# Patient Record
Sex: Female | Born: 1935
Health system: Southern US, Community
[De-identification: ages and names within clinical notes are randomized; demographics above are authoritative.]

## PROBLEM LIST (undated history)

## (undated) DIAGNOSIS — I10 Essential (primary) hypertension: Secondary | ICD-10-CM

## (undated) DIAGNOSIS — A46 Erysipelas: Secondary | ICD-10-CM

## (undated) DIAGNOSIS — J309 Allergic rhinitis, unspecified: Secondary | ICD-10-CM

## (undated) DIAGNOSIS — E039 Hypothyroidism, unspecified: Secondary | ICD-10-CM

## (undated) DIAGNOSIS — J302 Other seasonal allergic rhinitis: Secondary | ICD-10-CM

## (undated) DIAGNOSIS — C50919 Malignant neoplasm of unspecified site of unspecified female breast: Secondary | ICD-10-CM

## (undated) HISTORY — DX: Hypothyroidism, unspecified: E03.9

## (undated) HISTORY — PX: TONSILLECTOMY: SUR1361

## (undated) HISTORY — DX: Malignant neoplasm of unspecified site of unspecified female breast: C50.919

## (undated) HISTORY — DX: Allergic rhinitis, unspecified: J30.9

## (undated) HISTORY — DX: Other seasonal allergic rhinitis: J30.2

## (undated) HISTORY — DX: Essential (primary) hypertension: I10

## (undated) HISTORY — DX: Erysipelas: A46

---

## 1984-04-18 HISTORY — PX: BREAST LUMPECTOMY: SHX2

## 1985-04-18 DIAGNOSIS — C50919 Malignant neoplasm of unspecified site of unspecified female breast: Secondary | ICD-10-CM

## 1985-04-18 HISTORY — DX: Malignant neoplasm of unspecified site of unspecified female breast: C50.919

## 1998-10-29 ENCOUNTER — Other Ambulatory Visit: Admission: RE | Admit: 1998-10-29 | Discharge: 1998-10-29 | Payer: Self-pay | Admitting: Internal Medicine

## 1999-10-28 ENCOUNTER — Encounter: Admission: RE | Admit: 1999-10-28 | Discharge: 1999-10-28 | Payer: Self-pay | Admitting: Surgery

## 1999-10-28 ENCOUNTER — Encounter: Payer: Self-pay | Admitting: Surgery

## 1999-11-09 ENCOUNTER — Other Ambulatory Visit: Admission: RE | Admit: 1999-11-09 | Discharge: 1999-11-09 | Payer: Self-pay | Admitting: Internal Medicine

## 1999-12-29 ENCOUNTER — Ambulatory Visit (HOSPITAL_COMMUNITY): Admission: RE | Admit: 1999-12-29 | Discharge: 1999-12-29 | Payer: Self-pay | Admitting: *Deleted

## 2001-03-21 ENCOUNTER — Encounter: Payer: Self-pay | Admitting: Surgery

## 2001-03-21 ENCOUNTER — Encounter: Admission: RE | Admit: 2001-03-21 | Discharge: 2001-03-21 | Payer: Self-pay | Admitting: Surgery

## 2002-02-20 ENCOUNTER — Encounter: Admission: RE | Admit: 2002-02-20 | Discharge: 2002-02-20 | Payer: Self-pay | Admitting: Internal Medicine

## 2002-02-20 ENCOUNTER — Encounter: Payer: Self-pay | Admitting: Internal Medicine

## 2002-03-12 ENCOUNTER — Other Ambulatory Visit: Admission: RE | Admit: 2002-03-12 | Discharge: 2002-03-12 | Payer: Self-pay | Admitting: Internal Medicine

## 2003-02-24 ENCOUNTER — Encounter: Admission: RE | Admit: 2003-02-24 | Discharge: 2003-02-24 | Payer: Self-pay | Admitting: Internal Medicine

## 2003-03-20 ENCOUNTER — Other Ambulatory Visit: Admission: RE | Admit: 2003-03-20 | Discharge: 2003-03-20 | Payer: Self-pay | Admitting: Internal Medicine

## 2004-02-26 ENCOUNTER — Encounter: Admission: RE | Admit: 2004-02-26 | Discharge: 2004-02-26 | Payer: Self-pay | Admitting: Surgery

## 2004-03-02 ENCOUNTER — Encounter: Admission: RE | Admit: 2004-03-02 | Discharge: 2004-03-02 | Payer: Self-pay | Admitting: Surgery

## 2004-03-25 ENCOUNTER — Other Ambulatory Visit: Admission: RE | Admit: 2004-03-25 | Discharge: 2004-03-25 | Payer: Self-pay | Admitting: Internal Medicine

## 2005-01-26 ENCOUNTER — Encounter: Admission: RE | Admit: 2005-01-26 | Discharge: 2005-01-26 | Payer: Self-pay | Admitting: Internal Medicine

## 2005-04-28 ENCOUNTER — Ambulatory Visit (HOSPITAL_COMMUNITY): Admission: RE | Admit: 2005-04-28 | Discharge: 2005-04-28 | Payer: Self-pay | Admitting: *Deleted

## 2005-05-16 ENCOUNTER — Encounter: Admission: RE | Admit: 2005-05-16 | Discharge: 2005-05-16 | Payer: Self-pay | Admitting: Surgery

## 2005-05-18 ENCOUNTER — Encounter (INDEPENDENT_AMBULATORY_CARE_PROVIDER_SITE_OTHER): Payer: Self-pay | Admitting: Specialist

## 2005-05-18 ENCOUNTER — Ambulatory Visit (HOSPITAL_COMMUNITY): Admission: RE | Admit: 2005-05-18 | Discharge: 2005-05-18 | Payer: Self-pay | Admitting: *Deleted

## 2006-05-31 ENCOUNTER — Encounter: Admission: RE | Admit: 2006-05-31 | Discharge: 2006-05-31 | Payer: Self-pay | Admitting: Surgery

## 2007-06-07 ENCOUNTER — Encounter: Admission: RE | Admit: 2007-06-07 | Discharge: 2007-06-07 | Payer: Self-pay | Admitting: Surgery

## 2008-10-08 ENCOUNTER — Encounter: Admission: RE | Admit: 2008-10-08 | Discharge: 2008-10-08 | Payer: Self-pay | Admitting: Surgery

## 2008-12-03 ENCOUNTER — Ambulatory Visit (HOSPITAL_COMMUNITY): Admission: RE | Admit: 2008-12-03 | Discharge: 2008-12-03 | Payer: Self-pay | Admitting: Cardiology

## 2009-09-02 ENCOUNTER — Other Ambulatory Visit: Admission: RE | Admit: 2009-09-02 | Discharge: 2009-09-02 | Payer: Self-pay | Admitting: Internal Medicine

## 2009-10-09 ENCOUNTER — Encounter: Admission: RE | Admit: 2009-10-09 | Discharge: 2009-10-09 | Payer: Self-pay | Admitting: Surgery

## 2010-06-22 ENCOUNTER — Encounter (INDEPENDENT_AMBULATORY_CARE_PROVIDER_SITE_OTHER): Payer: Self-pay | Admitting: *Deleted

## 2010-06-29 NOTE — Letter (Signed)
Summary: New Patient letter  [facility]  [address] [place], [place] [address]   Phone: [phone]  Fax: [phone]       06/22/2010 MRN: [id number]  Julia Norton [address] [place], [place]  [address]  Dear Ms. Julia Norton,  Welcome to the Gastroenterology Division at [facility].    You are scheduled to see Dr.  Marina Goodell on 08-05-10 at 11:00A.M. on the 3rd floor at [facility], [address].  We ask that you try to arrive at our office 15 minutes prior to your appointment time to allow for check-in.  We would like you to complete the enclosed self-administered evaluation form prior to your visit and bring it with you on the day of your appointment.  We will review it with you.  Also, please bring a complete list of all your medications or, if you prefer, bring the medication bottles and we will list them.  Please bring your insurance card so that we may make a copy of it.  If your insurance requires a referral to see a specialist, please bring your referral form from your primary care physician.  Co-payments are due at the time of your visit and may be paid by cash, check or credit card.     Your office visit will consist of a consult with your physician (includes a physical exam), any laboratory testing he/she may order, scheduling of any necessary diagnostic testing (e.g. x-ray, ultrasound, CT-scan), and scheduling of a procedure (e.g. Endoscopy, Colonoscopy) if required.  Please allow enough time on your schedule to allow for any/all of these possibilities.    If you cannot keep your appointment, please call [phone] to cancel or reschedule prior to your appointment date.  This allows [place] the opportunity to schedule an appointment for another patient in need of care.  If you do not cancel or reschedule by 5 p.m. the business day prior to your appointment date, you will be charged a $50.00 late cancellation/no-show fee.    Thank you for choosing  [facility] for your medical needs.  We appreciate the opportunity to care for you.  Please visit [place] at our website  to learn more about our practice.                     Sincerely,                                                             The Gastroenterology Division

## 2010-08-05 ENCOUNTER — Encounter: Payer: Self-pay | Admitting: Internal Medicine

## 2010-08-05 ENCOUNTER — Ambulatory Visit (INDEPENDENT_AMBULATORY_CARE_PROVIDER_SITE_OTHER): Payer: Medicare Other | Admitting: Internal Medicine

## 2010-08-05 VITALS — BP 130/68 | HR 72 | Ht 68.0 in | Wt 204.0 lb

## 2010-08-05 DIAGNOSIS — R197 Diarrhea, unspecified: Secondary | ICD-10-CM

## 2010-08-05 DIAGNOSIS — Z1211 Encounter for screening for malignant neoplasm of colon: Secondary | ICD-10-CM

## 2010-08-05 NOTE — Progress Notes (Signed)
HISTORY OF PRESENT ILLNESS:  Julia Norton is a 75 y.o. female with the below listed medical history who presents today regarding recent problems with diarrhea and to establish as a new GI patient. The patient's previous GI care was directed by Dr. Sabino Gasser. Review of outside records shows a normal colonoscopy in September of 2001 and again in January 2007. Only internal hemorrhoids. Upper endoscopy in January of 2007 with minimal findings. Patient's current history is that of several months of diarrhea occurring several times daily. She underwent a laboratory workup with her primary care physician Dr. Selena Batten. Review of that workup shows normal CBC, comprehensive metabolic panel, multiple stool studies, stool for white blood cells, celiac sprue testing, fasting gastrin level, and urine for 5-hydroxyindoleacetic acid. Eventually, it was discovered the patient had increased her magnesium supplementation. After holding her magnesium, the diarrhea completely resolved. She is currently asymptomatic. Her GI review of systems is otherwise negative.  REVIEW OF SYSTEMS:  All non-GI ROS negative except for urinary frequency.  Past Medical History  Diagnosis Date  . Hemorrhoids   . Hypothyroidism   . Breast cancer 1987  . Hypertension   . Asthma   . Seasonal allergies     Past Surgical History  Procedure Date  . Tonsillectomy   . Breast lumpectomy     right    Social History Julia Norton  reports that she quit smoking about 49 years ago. She does not have any smokeless tobacco history on file. She reports that she does not drink alcohol or use illicit drugs.  family history includes Celiac disease in her sister and Heart disease in her father and mother.  Allergies  Allergen Reactions  . Penicillins        PHYSICAL EXAMINATION: Vital signs: BP 130/68  Pulse 72  Ht 5\' 8"  (1.727 m)  Wt 204 lb (92.534 kg)  BMI 31.02 kg/m2  Constitutional: generally well-appearing, no acute  distress Psychiatric: alert and oriented x3, cooperative Eyes: extraocular movements intact, anicteric, conjunctiva pink Mouth: oral pharynx moist, no lesions Neck: supple no lymphadenopathy Cardiovascular: heart regular rate and rhythm, no murmur Lungs: clear to auscultation bilaterally Abdomen: soft, nontender, nondistended, no obvious ascites, no peritoneal signs, normal bowel sounds, no organomegaly Extremities: no lower extremity edema bilaterally Skin: no lesions on visible extremities Neuro: No focal deficits. Normal reflexes   ASSESSMENT:  #1. Diarrhea. Secondary to the osmotic effect of magnesium. Better after holding magnesium. #2. Colon cancer screening. Up to date #3. General medical problems per Dr. Selena Batten  PLAN:  #1. GI followup when necessary

## 2010-08-05 NOTE — Patient Instructions (Signed)
Follow up as needed

## 2010-08-06 ENCOUNTER — Encounter: Payer: Self-pay | Admitting: Internal Medicine

## 2010-08-09 ENCOUNTER — Encounter: Payer: Self-pay | Admitting: Internal Medicine

## 2010-08-25 ENCOUNTER — Encounter (INDEPENDENT_AMBULATORY_CARE_PROVIDER_SITE_OTHER): Payer: Self-pay | Admitting: Surgery

## 2010-09-03 NOTE — Op Note (Signed)
NAMEBROGAN, [place]GL[place] NO.:  [id number]   MEDICAL RECORD NO.:  [id number]            PATIENT TYPE:   LOCATION:                                 FACILITY:   PHYSICIAN:  Georgiana Spinner, M.D.         DATE OF BIRTH:   DATE OF PROCEDURE:  DATE OF DISCHARGE:                                 OPERATIVE REPORT   ANESTHESIA:  Demerol 10 Versed 1 mg.   INDICATIONS:  Colon cancer screening.   PROCEDURE:  With the patient mildly sedated in the left lateral decubitus  position, the Olympus videoscopic colonoscope was inserted into the rectum  and passed under direct vision to the cecum; identified by ileocecal valve  and appendiceal orifice both which were photographed. From this point the  colonoscope was slowly withdrawn taking circumferential views of colonic  mucosa; stopping in the rectum which appeared normal on direct. It showed  hemorrhoids on retroflex view. The endoscope was straightened and withdrawn  the patient's vital signs and pulse oximeter remained stable. The patient  tolerated procedure well with no apparent complications.   FINDINGS:  Internal hemorrhoids otherwise unremarkable colonoscopic  examination to cecum. Plan consider repeat examination to 5-10 years           ______________________________  Georgiana Spinner, M.D.     GMO/MEDQ  D:  05/18/2005  T:  05/18/2005  Job:  [id number]

## 2010-09-03 NOTE — Procedures (Signed)
[facility]  Patient:    Julia Norton, Julia Norton                   MRN: [id number] Proc. Date: 12/29/99 Adm. Date:  [id number] Attending:  Sabino Gasser                           Procedure Report  PROCEDURE:  Colonoscopy.  INDICATION FOR PROCEDURE:  Hemoccult positivity.  ANESTHESIA:  Demerol 80, Versed 7 mg.  DESCRIPTION OF PROCEDURE:  With the patient mildly sedated in the left lateral decubitus position, the Olympus videoscopic colonoscope was inserted in the rectum and passed under direct vision to the cecum. The cecum was identified by the ileocecal valve and appendiceal orifice both of which were photographed. From this point, the colonoscope was slowly withdrawn taking circumferential views of the entire colonic mucosa, stopping only in the rectum which appeared normal in direct view and showed internal hemorrhoids on retroflexed view. The endoscope was straightened and withdrawn. The patients vital signs and pulse oximeter remained stable. The patient tolerated the procedure well without apparent complications.  FINDINGS:  Internal hemorrhoids otherwise unremarkable colonoscopic examination to the cecum.  PLAN:  Repeat examination in 5 years or as needed and have the patient follow-up with me as an outpatient. Will increase the patients fiber content in her diet. DD:  12/29/99 TD:  12/30/99 Job: [id number]

## 2010-09-03 NOTE — Op Note (Signed)
Julia Norton, Julia Norton            ACCOUNT NO.:  [id number]   MEDICAL RECORD NO.:  [id number]          PATIENT TYPE:  AMB   LOCATION:  ENDO                         FACILITY:  [facility]   PHYSICIAN:  Georgiana Spinner, M.D.    DATE OF BIRTH:  1935/09/27   DATE OF PROCEDURE:  05/18/2005  DATE OF DISCHARGE:                                 OPERATIVE REPORT   PROCEDURE:  Upper endoscopy.   INDICATIONS:  Dysphagia.   ANESTHESIA:  Demerol 50 Versed 6 mg.   DESCRIPTION OF PROCEDURE:  With the patient mildly sedated in the left  lateral decubitus position, the Olympus videoscopic endoscope was inserted  into the mouth and passed under direct vision through the esophagus which  appeared normal. The distal esophagus appeared to be slightly inflamed at  the squamocolumnar junction. This was photographed and biopsied where in the  stomach, fundus, and body appeared normal. Antrum showed small punctate  erosions which were photographed and biopsied. Duodenal bulb and second  portion of the duodenum appeared normal. From this point, the endoscope was  slowly withdrawn taking circumferential views of duodenal mucosa until the  endoscope was then pulled back into the stomach, placed in retroflexion, to  view the stomach from below. The endoscope was straightened and withdrawn;  taking circumferential views of the remaining gastric and esophageal mucosa.  The patient's vital signs and pulse oximeter remained stable. The patient  tolerated the procedure well without apparent complication.   FINDINGS:  1.  Punctate lesions of the pyloric and prepyloric area consistent with      possibly NSAID induced erosions, await biopsy report.  2.  Mild erythema of the squamocolumnar junction, await biopsy report.  3.  The patient will call me for results and follow up with me as an      outpatient.  4.  Proceed to colonoscopy, as planned.           ______________________________  Georgiana Spinner, M.D.     GMO/MEDQ  D:  05/18/2005  T:  05/18/2005  Job:  [id number]

## 2010-09-06 ENCOUNTER — Other Ambulatory Visit: Payer: Self-pay | Admitting: Surgery

## 2010-09-06 DIAGNOSIS — Z1231 Encounter for screening mammogram for malignant neoplasm of breast: Secondary | ICD-10-CM

## 2010-10-11 ENCOUNTER — Ambulatory Visit
Admission: RE | Admit: 2010-10-11 | Discharge: 2010-10-11 | Disposition: A | Discharge status: Home / Self Care | Payer: Medicare Other | Source: Ambulatory Visit | Attending: Surgery | Admitting: Surgery

## 2010-10-11 DIAGNOSIS — Z1231 Encounter for screening mammogram for malignant neoplasm of breast: Secondary | ICD-10-CM

## 2010-11-12 ENCOUNTER — Ambulatory Visit (INDEPENDENT_AMBULATORY_CARE_PROVIDER_SITE_OTHER): Payer: Self-pay | Admitting: Surgery

## 2010-12-03 ENCOUNTER — Ambulatory Visit (INDEPENDENT_AMBULATORY_CARE_PROVIDER_SITE_OTHER): Payer: Medicare Other | Admitting: Surgery

## 2010-12-08 ENCOUNTER — Ambulatory Visit (INDEPENDENT_AMBULATORY_CARE_PROVIDER_SITE_OTHER): Payer: Medicare Other | Admitting: Surgery

## 2010-12-16 ENCOUNTER — Ambulatory Visit: Payer: Medicare Other | Attending: Internal Medicine | Admitting: Physical Therapy

## 2010-12-16 DIAGNOSIS — H8110 Benign paroxysmal vertigo, unspecified ear: Secondary | ICD-10-CM | POA: Insufficient documentation

## 2010-12-16 DIAGNOSIS — R269 Unspecified abnormalities of gait and mobility: Secondary | ICD-10-CM | POA: Insufficient documentation

## 2010-12-16 DIAGNOSIS — IMO0001 Reserved for inherently not codable concepts without codable children: Secondary | ICD-10-CM | POA: Insufficient documentation

## 2010-12-24 ENCOUNTER — Encounter: Payer: Medicare Other | Admitting: Physical Therapy

## 2011-08-02 ENCOUNTER — Other Ambulatory Visit (INDEPENDENT_AMBULATORY_CARE_PROVIDER_SITE_OTHER): Payer: Self-pay | Admitting: Surgery

## 2011-08-02 DIAGNOSIS — Z1231 Encounter for screening mammogram for malignant neoplasm of breast: Secondary | ICD-10-CM

## 2011-10-12 ENCOUNTER — Ambulatory Visit
Admission: RE | Admit: 2011-10-12 | Discharge: 2011-10-12 | Disposition: A | Discharge status: Home / Self Care | Payer: PRIVATE HEALTH INSURANCE | Source: Ambulatory Visit | Attending: Surgery | Admitting: Surgery

## 2011-10-12 DIAGNOSIS — Z1231 Encounter for screening mammogram for malignant neoplasm of breast: Secondary | ICD-10-CM

## 2011-11-02 ENCOUNTER — Ambulatory Visit (INDEPENDENT_AMBULATORY_CARE_PROVIDER_SITE_OTHER): Payer: Medicare Other | Admitting: Surgery

## 2011-11-25 ENCOUNTER — Encounter (INDEPENDENT_AMBULATORY_CARE_PROVIDER_SITE_OTHER): Payer: Self-pay | Admitting: General Surgery

## 2011-11-25 DIAGNOSIS — Z853 Personal history of malignant neoplasm of breast: Secondary | ICD-10-CM | POA: Insufficient documentation

## 2011-12-09 ENCOUNTER — Ambulatory Visit (INDEPENDENT_AMBULATORY_CARE_PROVIDER_SITE_OTHER): Payer: PRIVATE HEALTH INSURANCE | Admitting: Surgery

## 2011-12-09 ENCOUNTER — Encounter (INDEPENDENT_AMBULATORY_CARE_PROVIDER_SITE_OTHER): Payer: Self-pay | Admitting: Surgery

## 2011-12-09 VITALS — BP 150/90 | HR 70 | Temp 97.4°F | Ht 68.5 in | Wt 200.2 lb

## 2011-12-09 DIAGNOSIS — Z853 Personal history of malignant neoplasm of breast: Secondary | ICD-10-CM

## 2011-12-09 NOTE — Patient Instructions (Signed)
Continue annual mammograms. Call if you have any problems

## 2011-12-09 NOTE — Progress Notes (Signed)
NAME: [name]       DOB: [birth year]           DATE: 12/09/2011       MRN: [id number]   Julia Norton is a 76 y.o.Marland Kitchenfemale who presents for routine followup of her Right breast cancer, IDC, diagnosed in 62 and treated with lumpectomy, axillary node dissection and radiation. She has no problems or concerns on either side.  PFSH: She has had no significant changes since the last visit here.  ROS: There have been no significant changes since the last visit here  EXAM:  VS: BP 150/90  Pulse 70  Temp 97.4 F (36.3 C) (Temporal)  Ht 5' 8.5" (1.74 m)  Wt 200 lb 3.2 oz (90.81 kg)  BMI 30.00 kg/m2   General: The patient is alert, oriented, generally healthy appearing, NAD. Mood and affect are normal.  Breasts:  There is persistent deformity in the UOQ on the right from the surgery, no change. No other abnormality noted  Lymphatics: She has no axillary or supraclavicular adenopathy on either side.  Extremities: Full ROM of the surgical side with no lymphedema noted.  Data Reviewed: Mammogram in June negative  Impression: Doing well, with no evidence of recurrent cancer or new cancer  Plan: Will continue to follow up on an annual basis here, although I told her no longer necessary since she is so far out from surgery

## 2012-07-25 ENCOUNTER — Encounter (INDEPENDENT_AMBULATORY_CARE_PROVIDER_SITE_OTHER): Payer: Self-pay

## 2012-11-20 ENCOUNTER — Other Ambulatory Visit: Payer: Self-pay

## 2012-11-20 DIAGNOSIS — Z1231 Encounter for screening mammogram for malignant neoplasm of breast: Secondary | ICD-10-CM

## 2012-11-20 DIAGNOSIS — Z853 Personal history of malignant neoplasm of breast: Secondary | ICD-10-CM

## 2012-11-20 DIAGNOSIS — Z9889 Other specified postprocedural states: Secondary | ICD-10-CM

## 2012-12-05 ENCOUNTER — Ambulatory Visit (INDEPENDENT_AMBULATORY_CARE_PROVIDER_SITE_OTHER): Payer: PRIVATE HEALTH INSURANCE | Admitting: Surgery

## 2013-01-03 ENCOUNTER — Ambulatory Visit
Admission: RE | Admit: 2013-01-03 | Discharge: 2013-01-03 | Disposition: A | Discharge status: Home / Self Care | Payer: PRIVATE HEALTH INSURANCE | Source: Ambulatory Visit

## 2013-01-03 DIAGNOSIS — Z853 Personal history of malignant neoplasm of breast: Secondary | ICD-10-CM

## 2013-01-03 DIAGNOSIS — Z1231 Encounter for screening mammogram for malignant neoplasm of breast: Secondary | ICD-10-CM

## 2013-01-03 DIAGNOSIS — Z9889 Other specified postprocedural states: Secondary | ICD-10-CM

## 2013-01-11 ENCOUNTER — Ambulatory Visit (INDEPENDENT_AMBULATORY_CARE_PROVIDER_SITE_OTHER): Payer: PRIVATE HEALTH INSURANCE | Admitting: Surgery

## 2013-01-11 ENCOUNTER — Encounter (INDEPENDENT_AMBULATORY_CARE_PROVIDER_SITE_OTHER): Payer: Self-pay | Admitting: Surgery

## 2013-01-11 VITALS — BP 128/78 | HR 64 | Temp 98.3°F | Resp 14 | Ht 68.0 in | Wt 203.6 lb

## 2013-01-11 DIAGNOSIS — Z853 Personal history of malignant neoplasm of breast: Secondary | ICD-10-CM

## 2013-01-11 NOTE — Patient Instructions (Signed)
Continue annual mamograms We will see you again on an as needed basis. Please call the office at [phone] if you have any questions or concerns. Thank you for allowing [place] to take care of you.

## 2013-01-11 NOTE — Progress Notes (Signed)
NAME: Julia Norton       DOB: Dec 01, 1935           DATE: 01/11/2013       MRN: [id number]   [name] is a 77 y.o.Marland Kitchenfemale who presents for routine followup of her Right breast cancer, IDC, diagnosed in 30 and treated with lumpectomy, axillary node dissection and radiation. She has no problems or concerns on either side.  PFSH: She has had no significant changes since the last visit here.  ROS: There have been no significant changes since the last visit here  EXAM:  VS: BP 128/78  Pulse 64  Temp(Src) 98.3 F (36.8 C) (Temporal)  Resp 14  Ht 5\' 8"  (1.727 m)  Wt 203 lb 9.6 oz (92.352 kg)  BMI 30.96 kg/m2   General: The patient is alert, oriented, generally healthy appearing, NAD. Mood and affect are normal.  Breasts:  There is persistent deformity in the UOQ on the right from the surgery, no change. No other abnormality noted  Lymphatics: She has no axillary or supraclavicular adenopathy on either side.  Extremities: Full ROM of the surgical side with no lymphedema noted.  Data Reviewed: Mammogram in September: DIGITAL SCREENING BILATERAL MAMMOGRAM WITH CAD  DIGITAL BREAST TOMOSYNTHESIS  Digital breast tomosynthesis images are acquired in two  projections. These images are reviewed in combination with the  digital mammogram, confirming the findings below.  Comparison: Previous exam(s).  FINDINGS:  ACR Breast Density Category b: There are scattered areas of  fibroglandular density.  There are no findings suspicious for malignancy.  Images were processed with CAD.  IMPRESSION:  No mammographic evidence of malignancy.  A result letter of this screening mammogram will be mailed directly  to the patient.  RECOMMENDATION:  Screening mammogram in one year. (Code:SM-B-01Y)  BI-RADS CATEGORY 1: Negative.  Original Report Authenticated By: Baird Lyons, M.D.   Impression: Doing well, with no evidence of recurrent cancer or new cancer  Plan: Will continue  to follow up on an annual basis here, although I told her no longer necessary since she is so far out from surgery

## 2014-02-20 ENCOUNTER — Other Ambulatory Visit: Payer: Self-pay

## 2014-02-20 DIAGNOSIS — Z1231 Encounter for screening mammogram for malignant neoplasm of breast: Secondary | ICD-10-CM

## 2014-03-11 ENCOUNTER — Ambulatory Visit: Payer: PRIVATE HEALTH INSURANCE

## 2014-04-08 ENCOUNTER — Ambulatory Visit
Admission: RE | Admit: 2014-04-08 | Discharge: 2014-04-08 | Disposition: A | Discharge status: Home / Self Care | Payer: Medicare Other | Source: Ambulatory Visit

## 2014-04-08 DIAGNOSIS — Z1231 Encounter for screening mammogram for malignant neoplasm of breast: Secondary | ICD-10-CM

## 2015-10-16 ENCOUNTER — Other Ambulatory Visit: Payer: Self-pay | Admitting: Internal Medicine

## 2015-10-16 DIAGNOSIS — Z1231 Encounter for screening mammogram for malignant neoplasm of breast: Secondary | ICD-10-CM

## 2015-12-02 ENCOUNTER — Ambulatory Visit
Admission: RE | Admit: 2015-12-02 | Discharge: 2015-12-02 | Disposition: A | Discharge status: Home / Self Care | Payer: Medicare Other | Source: Ambulatory Visit | Attending: Internal Medicine | Admitting: Internal Medicine

## 2015-12-02 DIAGNOSIS — Z1231 Encounter for screening mammogram for malignant neoplasm of breast: Secondary | ICD-10-CM

## 2016-10-25 ENCOUNTER — Other Ambulatory Visit: Payer: Self-pay | Admitting: Internal Medicine

## 2016-10-25 DIAGNOSIS — N939 Abnormal uterine and vaginal bleeding, unspecified: Secondary | ICD-10-CM

## 2016-11-01 ENCOUNTER — Ambulatory Visit
Admission: RE | Admit: 2016-11-01 | Discharge: 2016-11-01 | Disposition: A | Discharge status: Home / Self Care | Payer: Medicare Other | Source: Ambulatory Visit | Attending: Internal Medicine | Admitting: Internal Medicine

## 2016-11-01 DIAGNOSIS — N939 Abnormal uterine and vaginal bleeding, unspecified: Secondary | ICD-10-CM

## 2017-02-15 ENCOUNTER — Other Ambulatory Visit: Payer: Self-pay | Admitting: Urology

## 2017-02-15 ENCOUNTER — Ambulatory Visit (HOSPITAL_COMMUNITY)
Admission: RE | Admit: 2017-02-15 | Discharge: 2017-02-15 | Disposition: A | Discharge status: Home / Self Care | Payer: Medicare Other | Source: Ambulatory Visit | Attending: Urology | Admitting: Urology

## 2017-02-15 DIAGNOSIS — I700 Atherosclerosis of aorta: Secondary | ICD-10-CM | POA: Diagnosis not present

## 2017-02-15 DIAGNOSIS — J984 Other disorders of lung: Secondary | ICD-10-CM | POA: Diagnosis present

## 2017-02-15 DIAGNOSIS — R918 Other nonspecific abnormal finding of lung field: Secondary | ICD-10-CM | POA: Insufficient documentation

## 2017-02-20 ENCOUNTER — Other Ambulatory Visit: Payer: Self-pay | Admitting: Internal Medicine

## 2017-02-20 DIAGNOSIS — Z1231 Encounter for screening mammogram for malignant neoplasm of breast: Secondary | ICD-10-CM

## 2017-02-21 ENCOUNTER — Ambulatory Visit
Admission: RE | Admit: 2017-02-21 | Discharge: 2017-02-21 | Disposition: A | Discharge status: Home / Self Care | Payer: Medicare Other | Source: Ambulatory Visit | Attending: Internal Medicine | Admitting: Internal Medicine

## 2017-02-21 DIAGNOSIS — Z1231 Encounter for screening mammogram for malignant neoplasm of breast: Secondary | ICD-10-CM

## 2018-02-02 ENCOUNTER — Other Ambulatory Visit: Payer: Self-pay | Admitting: Internal Medicine

## 2018-02-02 DIAGNOSIS — Z1231 Encounter for screening mammogram for malignant neoplasm of breast: Secondary | ICD-10-CM

## 2018-03-14 ENCOUNTER — Ambulatory Visit: Payer: Medicare Other

## 2018-04-17 ENCOUNTER — Ambulatory Visit
Admission: RE | Admit: 2018-04-17 | Discharge: 2018-04-17 | Disposition: A | Discharge status: Home / Self Care | Payer: Medicare Other | Source: Ambulatory Visit | Attending: Internal Medicine | Admitting: Internal Medicine

## 2018-04-17 DIAGNOSIS — Z1231 Encounter for screening mammogram for malignant neoplasm of breast: Secondary | ICD-10-CM

## 2019-07-17 ENCOUNTER — Other Ambulatory Visit: Payer: Self-pay | Admitting: Internal Medicine

## 2019-07-17 DIAGNOSIS — Z1231 Encounter for screening mammogram for malignant neoplasm of breast: Secondary | ICD-10-CM

## 2019-08-08 ENCOUNTER — Other Ambulatory Visit: Payer: Self-pay

## 2019-08-08 ENCOUNTER — Encounter: Payer: Self-pay | Admitting: Internal Medicine

## 2019-08-08 ENCOUNTER — Ambulatory Visit (INDEPENDENT_AMBULATORY_CARE_PROVIDER_SITE_OTHER): Payer: Medicare Other | Admitting: Internal Medicine

## 2019-08-08 VITALS — BP 150/110 | HR 71 | Temp 97.6°F | Ht 68.0 in | Wt 183.0 lb

## 2019-08-08 DIAGNOSIS — Z832 Family history of diseases of the blood and blood-forming organs and certain disorders involving the immune mechanism: Secondary | ICD-10-CM

## 2019-08-08 DIAGNOSIS — Z23 Encounter for immunization: Secondary | ICD-10-CM | POA: Diagnosis not present

## 2019-08-08 DIAGNOSIS — E039 Hypothyroidism, unspecified: Secondary | ICD-10-CM | POA: Insufficient documentation

## 2019-08-08 DIAGNOSIS — E538 Deficiency of other specified B group vitamins: Secondary | ICD-10-CM | POA: Diagnosis not present

## 2019-08-08 DIAGNOSIS — R739 Hyperglycemia, unspecified: Secondary | ICD-10-CM

## 2019-08-08 DIAGNOSIS — E2839 Other primary ovarian failure: Secondary | ICD-10-CM | POA: Diagnosis not present

## 2019-08-08 DIAGNOSIS — J45909 Unspecified asthma, uncomplicated: Secondary | ICD-10-CM | POA: Insufficient documentation

## 2019-08-08 DIAGNOSIS — I10 Essential (primary) hypertension: Secondary | ICD-10-CM

## 2019-08-08 DIAGNOSIS — R609 Edema, unspecified: Secondary | ICD-10-CM

## 2019-08-08 DIAGNOSIS — E559 Vitamin D deficiency, unspecified: Secondary | ICD-10-CM | POA: Diagnosis not present

## 2019-08-08 DIAGNOSIS — Z0001 Encounter for general adult medical examination with abnormal findings: Secondary | ICD-10-CM | POA: Insufficient documentation

## 2019-08-08 DIAGNOSIS — J309 Allergic rhinitis, unspecified: Secondary | ICD-10-CM | POA: Insufficient documentation

## 2019-08-08 DIAGNOSIS — Z0000 Encounter for general adult medical examination without abnormal findings: Secondary | ICD-10-CM | POA: Diagnosis not present

## 2019-08-08 LAB — VITAMIN D 25 HYDROXY (VIT D DEFICIENCY, FRACTURES): VITD: 38.3 ng/mL (ref 30.00–100.00)

## 2019-08-08 LAB — CBC WITH DIFFERENTIAL/PLATELET
Basophils Absolute: 0 10*3/uL (ref 0.0–0.1)
Basophils Relative: 0.6 % (ref 0.0–3.0)
Eosinophils Absolute: 0.4 10*3/uL (ref 0.0–0.7)
Eosinophils Relative: 5.8 % — ABNORMAL HIGH (ref 0.0–5.0)
HCT: 41.3 % (ref 36.0–46.0)
Hemoglobin: 13.9 g/dL (ref 12.0–15.0)
Lymphocytes Relative: 17.9 % (ref 12.0–46.0)
Lymphs Abs: 1.4 10*3/uL (ref 0.7–4.0)
MCHC: 33.7 g/dL (ref 30.0–36.0)
MCV: 89.3 fl (ref 78.0–100.0)
Monocytes Absolute: 0.5 10*3/uL (ref 0.1–1.0)
Monocytes Relative: 6.7 % (ref 3.0–12.0)
Neutro Abs: 5.3 10*3/uL (ref 1.4–7.7)
Neutrophils Relative %: 69 % (ref 43.0–77.0)
Platelets: 211 10*3/uL (ref 150.0–400.0)
RBC: 4.63 Mil/uL (ref 3.87–5.11)
RDW: 14.1 % (ref 11.5–15.5)
WBC: 7.6 10*3/uL (ref 4.0–10.5)

## 2019-08-08 LAB — HEPATIC FUNCTION PANEL
ALT: 17 U/L (ref 0–35)
AST: 24 U/L (ref 0–37)
Albumin: 4.5 g/dL (ref 3.5–5.2)
Alkaline Phosphatase: 68 U/L (ref 39–117)
Bilirubin, Direct: 0.1 mg/dL (ref 0.0–0.3)
Total Bilirubin: 0.7 mg/dL (ref 0.2–1.2)
Total Protein: 6.8 g/dL (ref 6.0–8.3)

## 2019-08-08 LAB — URINALYSIS, ROUTINE W REFLEX MICROSCOPIC
Bilirubin Urine: NEGATIVE
Hgb urine dipstick: NEGATIVE
Ketones, ur: NEGATIVE
Leukocytes,Ua: NEGATIVE
Nitrite: NEGATIVE
RBC / HPF: NONE SEEN (ref 0–?)
Specific Gravity, Urine: 1.015 (ref 1.000–1.030)
Total Protein, Urine: NEGATIVE
Urine Glucose: NEGATIVE
Urobilinogen, UA: 0.2 (ref 0.0–1.0)
pH: 7 (ref 5.0–8.0)

## 2019-08-08 LAB — BASIC METABOLIC PANEL
BUN: 24 mg/dL — ABNORMAL HIGH (ref 6–23)
CO2: 32 mEq/L (ref 19–32)
Calcium: 9.8 mg/dL (ref 8.4–10.5)
Chloride: 99 mEq/L (ref 96–112)
Creatinine, Ser: 1.12 mg/dL (ref 0.40–1.20)
GFR: 46.56 mL/min — ABNORMAL LOW (ref >60.00)
Glucose, Bld: 98 mg/dL (ref 70–99)
Potassium: 3.9 mEq/L (ref 3.5–5.1)
Sodium: 138 mEq/L (ref 135–145)

## 2019-08-08 LAB — VITAMIN B12: Vitamin B-12: 627 pg/mL (ref 211–911)

## 2019-08-08 LAB — LIPID PANEL
Cholesterol: 181 mg/dL (ref 0–200)
HDL: 61.9 mg/dL (ref >39.00)
LDL Cholesterol: 104 mg/dL — ABNORMAL HIGH (ref 0–99)
NonHDL: 118.97
Total CHOL/HDL Ratio: 3
Triglycerides: 75 mg/dL (ref 0.0–149.0)
VLDL: 15 mg/dL (ref 0.0–40.0)

## 2019-08-08 LAB — TSH: TSH: 2.81 u[IU]/mL (ref 0.35–4.50)

## 2019-08-08 LAB — HEMOGLOBIN A1C: Hgb A1c MFr Bld: 5 % (ref 4.6–6.5)

## 2019-08-08 MED ORDER — CEPHALEXIN 500 MG PO CAPS: 500.0000 mg | ORAL_CAPSULE | Freq: Three times a day (TID) | ORAL | 0 refills | Status: AC

## 2019-08-08 NOTE — Progress Notes (Signed)
Subjective:    Patient ID: Julia Norton, female    DOB: 06/15/1935, 84 y.o.   MRN: [id number]  HPI  Here for wellness and to establish as new pt; Overall doing ok;  Pt denies Chest pain, worsening SOB, DOE, wheezing, orthopnea, PND, worsening LE edema, palpitations, dizziness or syncope, though does have chronic bilateral venous insufficiency with hx of erysipelas at one point for which she asks for keflex preventive as recommended by the previous MD.  Pt denies neurological change such as new headache, facial or extremity weakness.  Pt denies polydipsia, polyuria, or low sugar symptoms. Pt states overall good compliance with treatment and medications, good tolerability, and has been trying to follow appropriate diet.  Pt denies worsening depressive symptoms, suicidal ideation or panic. No fever, night sweats, wt loss, loss of appetite, or other constitutional symptoms.  Pt states good ability with ADL's, has low fall risk, home safety reviewed and adequate, no other significant changes in hearing or vision, and only occasionally active with exercise.  Also has strong FH of factor V leiden abnormal. Past Medical History:  Diagnosis Date  . Allergic rhinitis   . Asthma   . Breast cancer ([place]) 1987  . Hemorrhoids   . Hypertension   . Hypothyroidism    Past Surgical History:  Procedure Laterality Date  . BREAST LUMPECTOMY Right 1986   malignant  . TONSILLECTOMY      reports that she quit smoking about 58 years ago. She does not have any smokeless tobacco history on file. She reports that she does not drink alcohol or use drugs. family history includes Celiac disease in her sister; Heart disease in her father, mother, and sister. Allergies  Allergen Reactions  . Penicillins    Current Outpatient Medications on File Prior to Visit  Medication Sig Dispense Refill  . aspirin 81 MG tablet Take 2 tablets by mouth once daily    . hydrALAZINE (APRESOLINE) 25 MG tablet Take 25 mg by mouth 3  (three) times daily.    Marland Kitchen loratadine (CLARITIN) 10 MG tablet Take 10 mg by mouth daily.      . Magnesium 400 MG CAPS Take by mouth.    . Multiple Vitamin (MULTIVITAMIN PO) Take 1 tablet by mouth daily.      . Omega-3 Fatty Acids (FISH OIL) 1000 MG CAPS Take 3 capsules by mouth daily.     . solifenacin (VESICARE) 5 MG tablet Take 10 mg by mouth daily.      Marland Kitchen spironolactone (ALDACTONE) 25 MG tablet Take 25 mg by mouth every Monday, Wednesday, and Friday.    Marland Kitchen SYNTHROID 100 MCG tablet Take 100 mcg by mouth daily.    . valsartan-hydrochlorothiazide (DIOVAN-HCT) 320-25 MG per tablet     . levothyroxine (SYNTHROID, LEVOTHROID) 125 MCG tablet Take 125 mcg by mouth daily.       No current facility-administered medications on file prior to visit.   Review of Systems All otherwise neg per pt     Objective:   Physical Exam BP (!) 150/110 (BP Location: Left Arm, Patient Position: Sitting, Cuff Size: Large)   Pulse 71   Temp 97.6 F (36.4 C) (Oral)   Ht 5\' 8"  (1.727 m)   Wt 183 lb (83 kg)   SpO2 95%   BMI 27.83 kg/m  VS noted,  Constitutional: Pt appears in NAD HENT: Head: NCAT.  Right Ear: External ear normal.  Left Ear: External ear normal.  Eyes: . Pupils are equal, round, and  reactive to light. Conjunctivae and EOM are normal Nose: without d/c or deformity Neck: Neck supple. Gross normal ROM Cardiovascular: Normal rate and regular rhythm.   Pulmonary/Chest: Effort normal and breath sounds without rales or wheezing.  Abd:  Soft, NT, ND, + BS, no organomegaly Neurological: Pt is alert. At baseline orientation, motor grossly intact Skin: Skin is warm. No rashes, other new lesions, 1+ bilat LE edema c/w venous vs lymphedema Psychiatric: Pt behavior is normal without agitation  All otherwise neg per pt Lab Results  Component Value Date   WBC 7.6 08/08/2019   HGB 13.9 08/08/2019   HCT 41.3 08/08/2019   PLT 211.0 08/08/2019   GLUCOSE 98 08/08/2019   CHOL 181 08/08/2019   TRIG 75.0  08/08/2019   HDL 61.90 08/08/2019   LDLCALC 104 (H) 08/08/2019   ALT 17 08/08/2019   AST 24 08/08/2019   NA 138 08/08/2019   K 3.9 08/08/2019   CL 99 08/08/2019   CREATININE 1.12 08/08/2019   BUN 24 (H) 08/08/2019   CO2 32 08/08/2019   TSH 2.81 08/08/2019   HGBA1C 5.0 08/08/2019      Assessment & Plan:

## 2019-08-08 NOTE — Patient Instructions (Addendum)
You had the Tdap and Prevnar 13 shots today  Please schedule the bone density test before leaving today at the scheduling desk (where you check out)  Please take all new medication as prescribed - the cephalexin as needed  Please check BP at home daily for 2 wks, and call if the average is over 140/90  Please continue all other medications as before, and refills have been done if requested.  Please have the pharmacy call with any other refills you may need.  Please continue your efforts at being more active, low cholesterol diet, and weight control.  You are otherwise up to date with prevention measures today.  Please keep your appointments with your specialists as you may have planned  Please go to the LAB at the blood drawing area for the tests to be done  You will be contacted by phone if any changes need to be made immediately.  Otherwise, you will receive a letter about your results with an explanation, but please check with MyChart first.  Please remember to sign up for MyChart if you have not done so, as this will be important to you in the future with finding out test results, communicating by private email, and scheduling acute appointments online when needed.  Please make an Appointment to return in 6 months, or sooner if needed

## 2019-08-11 ENCOUNTER — Encounter: Payer: Self-pay | Admitting: Internal Medicine

## 2019-08-11 DIAGNOSIS — Z832 Family history of diseases of the blood and blood-forming organs and certain disorders involving the immune mechanism: Secondary | ICD-10-CM | POA: Insufficient documentation

## 2019-08-11 DIAGNOSIS — I872 Venous insufficiency (chronic) (peripheral): Secondary | ICD-10-CM | POA: Insufficient documentation

## 2019-08-11 DIAGNOSIS — R609 Edema, unspecified: Secondary | ICD-10-CM | POA: Insufficient documentation

## 2019-08-11 NOTE — Assessment & Plan Note (Signed)
Chronic, ? Lymphedema vs venous, for keflex prn with hx of erysipelas

## 2019-08-11 NOTE — Assessment & Plan Note (Signed)

## 2019-08-11 NOTE — Assessment & Plan Note (Signed)
Mild elevation, d/w pt, declines med change, for f/u bP at home and next visit

## 2019-08-11 NOTE — Assessment & Plan Note (Signed)
For lab f/u 

## 2019-08-14 LAB — FACTOR 5 LEIDEN: Result: NEGATIVE

## 2019-08-20 ENCOUNTER — Ambulatory Visit (INDEPENDENT_AMBULATORY_CARE_PROVIDER_SITE_OTHER)
Admission: RE | Admit: 2019-08-20 | Discharge: 2019-08-20 | Disposition: A | Discharge status: Home / Self Care | Payer: Medicare Other | Source: Ambulatory Visit | Attending: Internal Medicine | Admitting: Internal Medicine

## 2019-08-20 ENCOUNTER — Other Ambulatory Visit: Payer: Self-pay

## 2019-08-20 DIAGNOSIS — E2839 Other primary ovarian failure: Secondary | ICD-10-CM

## 2019-08-26 ENCOUNTER — Telehealth: Payer: Self-pay | Admitting: Internal Medicine

## 2019-08-26 NOTE — Telephone Encounter (Signed)
   EMS called on 5/09 due to feeling weakness all over, dizziness., EMS states BP was 220/100.  Did not go to hospital Patient states she has  a headache today BP 168/82 today  Patient wants to know if she should increase valsartan-hydrochlorothiazide (DIOVAN-HCT) 320-25 MG per tablet Appointment scheduled 08/27/19

## 2019-08-26 NOTE — Telephone Encounter (Signed)
No this is highest dose of that med.  Will see at her OV

## 2019-08-27 ENCOUNTER — Encounter: Payer: Self-pay | Admitting: Internal Medicine

## 2019-08-27 ENCOUNTER — Ambulatory Visit (INDEPENDENT_AMBULATORY_CARE_PROVIDER_SITE_OTHER): Payer: Medicare Other | Admitting: Internal Medicine

## 2019-08-27 ENCOUNTER — Other Ambulatory Visit: Payer: Self-pay

## 2019-08-27 VITALS — BP 150/78 | HR 68 | Temp 98.5°F | Ht 68.0 in | Wt 184.0 lb

## 2019-08-27 DIAGNOSIS — J309 Allergic rhinitis, unspecified: Secondary | ICD-10-CM

## 2019-08-27 DIAGNOSIS — N3281 Overactive bladder: Secondary | ICD-10-CM

## 2019-08-27 DIAGNOSIS — J4520 Mild intermittent asthma, uncomplicated: Secondary | ICD-10-CM | POA: Diagnosis not present

## 2019-08-27 DIAGNOSIS — M79645 Pain in left finger(s): Secondary | ICD-10-CM

## 2019-08-27 DIAGNOSIS — I10 Essential (primary) hypertension: Secondary | ICD-10-CM | POA: Diagnosis not present

## 2019-08-27 MED ORDER — DICLOFENAC SODIUM 1 % EX GEL: 2.0000 g | Freq: Four times a day (QID) | CUTANEOUS | 3 refills | Status: DC

## 2019-08-27 MED ORDER — HYDRALAZINE HCL 50 MG PO TABS: 50.0000 mg | ORAL_TABLET | Freq: Three times a day (TID) | ORAL | 3 refills | Status: DC

## 2019-08-27 MED ORDER — FEXOFENADINE HCL 180 MG PO TABS: 180.0000 mg | ORAL_TABLET | Freq: Every day | ORAL | 3 refills | Status: DC

## 2019-08-27 MED ORDER — SOLIFENACIN SUCCINATE 10 MG PO TABS: 10.0000 mg | ORAL_TABLET | Freq: Every day | ORAL | 3 refills | Status: DC

## 2019-08-27 MED ORDER — TRIAMCINOLONE ACETONIDE 55 MCG/ACT NA AERO: 2.0000 | INHALATION_SPRAY | Freq: Every day | NASAL | 3 refills | Status: DC

## 2019-08-27 NOTE — Assessment & Plan Note (Signed)
Mild to mod, for allegra, nascort, to f/u any worsening symptoms or concerns

## 2019-08-27 NOTE — Assessment & Plan Note (Signed)
For voltaren gel prn 

## 2019-08-27 NOTE — Patient Instructions (Signed)
Ok to increase the vesicare to 10 mg per day  Ok to stop the aldactone (spironolactone)  Ok to increase the hydralazine to 50 mg x three times per day  Please take all new medication as prescribed - the allegra and nasacort OTC for allergies  Please take all new medication as prescribed - the voltaren gel otc for thumb and wrist pain  Please continue all other medications as before, and refills have been done if requested.  Please have the pharmacy call with any other refills you may need  Please keep your appointments with your specialists as you may have planned

## 2019-08-27 NOTE — Assessment & Plan Note (Addendum)
Uncontrolled, d/c aldactone, for increased hydralazine 50 tid  I spent 41 minutes in preparing to see the patient by review of recent labs, imaging and procedures, obtaining and reviewing separately obtained history, communicating with the patient and family or caregiver, ordering medications, tests or procedures, and documenting clinical information in the EHR including the differential Dx, treatment, and any further evaluation and other management of htn, allergies, asthma, oaB, left thumb pain

## 2019-08-27 NOTE — Progress Notes (Signed)
Subjective:    Patient ID: Julia Norton, female    DOB: 12-Dec-1935, 84 y.o.   MRN: [id number]  HPI  Here with family with increased urainary frequency despite the vesicare on dual diuretic for bp purposes, getting more risky due to having to urinate every 2 hrs during day, and a few times at night, walks with cane, no falls but seems risky to family; Pt denies chest pain, increased sob or doe, wheezing, orthopnea, PND, increased LE swelling, palpitations, dizziness or syncope.  Pt denies new neurological symptoms such as new headache, or facial or extremity weakness or numbness   Pt denies polydipsia, polyuria, Does have several wks ongoing nasal allergy symptoms with clearish congestion, itch and sneezing, without fever, pain, ST, cough, swelling or wheezing.  Also with recurring pain to base of left thumb bony prominence x 2 wks   BP Readings from Last 3 Encounters:  08/27/19 (!) 150/78  08/08/19 (!) 150/110  01/11/13 128/78   Wt Readings from Last 3 Encounters:  08/27/19 184 lb (83.5 kg)  08/08/19 183 lb (83 kg)  01/11/13 203 lb 9.6 oz (92.4 kg)   Past Medical History:  Diagnosis Date  . Allergic rhinitis   . Asthma   . Breast cancer ([place]) 1987  . Hemorrhoids   . Hypertension   . Hypothyroidism    Past Surgical History:  Procedure Laterality Date  . BREAST LUMPECTOMY Right 1986   malignant  . TONSILLECTOMY      reports that she quit smoking about 58 years ago. She does not have any smokeless tobacco history on file. She reports that she does not drink alcohol or use drugs. family history includes Arthritis in her father, mother, and sister; COPD in her sister; Celiac disease in her sister; Heart disease in her father, mother, and sister; Hypertension in her mother and sister. Allergies  Allergen Reactions  . Penicillins    Current Outpatient Medications on File Prior to Visit  Medication Sig Dispense Refill  . aspirin 81 MG tablet Take 2 tablets by mouth once daily     . levothyroxine (SYNTHROID) 100 MCG tablet Take 100 mcg by mouth daily before breakfast.    . Magnesium 400 MG CAPS Take by mouth.    . Multiple Vitamin (MULTIVITAMIN PO) Take 1 tablet by mouth daily.      . Omega-3 Fatty Acids (FISH OIL) 1000 MG CAPS Take 3 capsules by mouth daily.     Marland Kitchen SYNTHROID 100 MCG tablet Take 100 mcg by mouth daily.    . valsartan-hydrochlorothiazide (DIOVAN-HCT) 320-25 MG per tablet      No current facility-administered medications on file prior to visit.   Review of Systems All otherwise neg per pt     Objective:   Physical Exam BP (!) 150/78 (BP Location: Left Arm, Patient Position: Sitting, Cuff Size: Large)   Pulse 68   Temp 98.5 F (36.9 C) (Oral)   Ht 5\' 8"  (1.727 m)   Wt 184 lb (83.5 kg)   SpO2 97%   BMI 27.98 kg/m  VS noted,  Constitutional: Pt appears in NAD HENT: Head: NCAT.  Right Ear: External ear normal.  Left Ear: External ear normal.  Eyes: . Pupils are equal, round, and reactive to light. Conjunctivae and EOM are normal Nose: without d/c or deformity Neck: Neck supple. Gross normal ROM Cardiovascular: Normal rate and regular rhythm.   Pulmonary/Chest: Effort normal and breath sounds without rales or wheezing.  Abd:  Soft, NT, ND, +  BS, no organomegaly Neurological: Pt is alert. At baseline orientation, motor grossly intact Skin: Skin is warm. No rashes, other new lesions, no LE edema Psychiatric: Pt behavior is normal without agitation  All otherwise neg per pt Lab Results  Component Value Date   WBC 7.6 08/08/2019   HGB 13.9 08/08/2019   HCT 41.3 08/08/2019   PLT 211.0 08/08/2019   GLUCOSE 98 08/08/2019   CHOL 181 08/08/2019   TRIG 75.0 08/08/2019   HDL 61.90 08/08/2019   LDLCALC 104 (H) 08/08/2019   ALT 17 08/08/2019   AST 24 08/08/2019   NA 138 08/08/2019   K 3.9 08/08/2019   CL 99 08/08/2019   CREATININE 1.12 08/08/2019   BUN 24 (H) 08/08/2019   CO2 32 08/08/2019   TSH 2.81 08/08/2019   HGBA1C 5.0 08/08/2019       Assessment & Plan:

## 2019-08-27 NOTE — Assessment & Plan Note (Signed)
stable overall by history and exam, recent data reviewed with pt, and pt to continue medical treatment as before,  to f/u any worsening symptoms or concerns  

## 2019-08-27 NOTE — Assessment & Plan Note (Signed)
For increased vesicar 10 qd

## 2019-08-29 ENCOUNTER — Telehealth: Payer: Self-pay

## 2019-08-29 NOTE — Telephone Encounter (Signed)
Patient called and spoke with [facility] on 08/28/2019 8:09:34 PM and states that she thought she was having a stroke on Sunday, and she called 911. They say no stroke or heart attack and to call her doctor. Yesterday her doctor took her off the fluid pill, and increased her hydralazine from 25 to 50, and 3xday. And her bladder med. He said to continue her Valsartan HCTC. Her pulse is low today, at around 60 but it was 55 a couple of min ago. She has been very tired and does not feel good, and she is light headed. And has a headache like she is stuffed up. Her oxygen is good at 99,. She feels like is not getting enough oxygen and she needs to take deep breaths.  Patient was advised to go to ED at the time of call.  No documentation that I can see that she went to ED.

## 2019-08-30 ENCOUNTER — Telehealth: Payer: Self-pay | Admitting: Internal Medicine

## 2019-08-30 NOTE — Telephone Encounter (Signed)
Since HR is normal, and pt just started her increased med I see no reason for cardiology referral  Pt should f/u as per last OV recommendation

## 2019-08-30 NOTE — Telephone Encounter (Signed)
  Patient requesting referral to cardiologist for hypertension and  HR Referral to [facility]

## 2019-08-30 NOTE — Telephone Encounter (Signed)
Forwarding note to PCP - Pt did not go to ED per [facility] Advise.   Also (in another note) pt would like a referral to [facility] due to HTN and HR  Please advise.

## 2019-09-02 NOTE — Telephone Encounter (Signed)
Left detailed message for with PCP recommendation. Also, informed pt to call back if there were any questions.

## 2019-09-09 ENCOUNTER — Other Ambulatory Visit: Payer: Self-pay

## 2019-09-09 ENCOUNTER — Telehealth: Payer: Self-pay

## 2019-09-09 MED ORDER — SOLIFENACIN SUCCINATE 10 MG PO TABS: 10.0000 mg | ORAL_TABLET | Freq: Every day | ORAL | 3 refills | Status: DC

## 2019-09-09 NOTE — Telephone Encounter (Signed)
1.Medication Requested:solifenacin (VESICARE) 10 MG tablet  2. Pharmacy (Name, [place], City):[facility], [place] to [facility]istered [facility]  3. On Med List: Yes   4. Last Visit with PCP: 5.11.21  5. Next visit date with PCP: n/a   Agent: Please be advised that RX refills may take up to 3 business days. We ask that you follow-up with your pharmacy.

## 2019-09-17 ENCOUNTER — Other Ambulatory Visit: Payer: Self-pay

## 2019-09-17 ENCOUNTER — Ambulatory Visit (INDEPENDENT_AMBULATORY_CARE_PROVIDER_SITE_OTHER): Payer: Medicare Other | Admitting: Internal Medicine

## 2019-09-17 ENCOUNTER — Encounter: Payer: Self-pay | Admitting: Internal Medicine

## 2019-09-17 VITALS — BP 142/80 | HR 74 | Temp 98.0°F | Ht 68.0 in | Wt 184.0 lb

## 2019-09-17 DIAGNOSIS — R42 Dizziness and giddiness: Secondary | ICD-10-CM

## 2019-09-17 DIAGNOSIS — R29818 Other symptoms and signs involving the nervous system: Secondary | ICD-10-CM

## 2019-09-17 LAB — CBC
HCT: 40.2 % (ref 36.0–46.0)
Hemoglobin: 13.7 g/dL (ref 12.0–15.0)
MCHC: 34.1 g/dL (ref 30.0–36.0)
MCV: 88.5 fl (ref 78.0–100.0)
Platelets: 189 10*3/uL (ref 150.0–400.0)
RBC: 4.55 Mil/uL (ref 3.87–5.11)
RDW: 14.1 % (ref 11.5–15.5)
WBC: 8.2 10*3/uL (ref 4.0–10.5)

## 2019-09-17 LAB — COMPREHENSIVE METABOLIC PANEL
ALT: 16 U/L (ref 0–35)
AST: 21 U/L (ref 0–37)
Albumin: 4.4 g/dL (ref 3.5–5.2)
Alkaline Phosphatase: 63 U/L (ref 39–117)
BUN: 23 mg/dL (ref 6–23)
CO2: 32 mEq/L (ref 19–32)
Calcium: 9.4 mg/dL (ref 8.4–10.5)
Chloride: 96 mEq/L (ref 96–112)
Creatinine, Ser: 0.97 mg/dL (ref 0.40–1.20)
GFR: 54.94 mL/min — ABNORMAL LOW (ref >60.00)
Glucose, Bld: 128 mg/dL — ABNORMAL HIGH (ref 70–99)
Potassium: 4 mEq/L (ref 3.5–5.1)
Sodium: 134 mEq/L — ABNORMAL LOW (ref 135–145)
Total Bilirubin: 0.7 mg/dL (ref 0.2–1.2)
Total Protein: 6.7 g/dL (ref 6.0–8.3)

## 2019-09-17 LAB — TSH: TSH: 5.71 u[IU]/mL — ABNORMAL HIGH (ref 0.35–4.50)

## 2019-09-17 NOTE — Assessment & Plan Note (Signed)
Checking CT head to rule out recent stroke. Checking labs to rule out metabolic cause given medication changes recently. We talked about medication side effect or rapid change in BP related to some persistent symptoms. For now keep medications the same and adjust as needed. Referral to cardiology done for consideration of risk stratification testing for CV disease.

## 2019-09-17 NOTE — Patient Instructions (Signed)
We will check the labs today and get a scan of the brain to check for problems.

## 2019-09-17 NOTE — Progress Notes (Signed)
Subjective:   Patient ID: Julia Norton, female    DOB: [birth year], 84 y.o.   MRN: [id number]  HPI The patient is an 84 YO female coming in for concerns about dizziness. She had episode around mother's day with dizziness and weakness of arms and legs. She called 911 and they told her it was not stroke or heart attack and did not take her to ER. She had high BP and came for visit at the office. At that time hydralazine was increased to 50 mg TID (from 25 mg TID) and aldactone stopped and vesicare increased to 10 mg daily. She has been okay since then but still symptoms of off and on dizziness. Mostly the dizziness is with standing. She is okay lying in bed and even with head turn in bed. She is running about 130s/60s BP average in the last week. Denies headaches or chest pains. She is having some weakness in her arms and legs but not as bad as previously. She is concerned about her heart and making sure she did not have a stroke. Denies side effects with change in medications. Denies numbness or speech changes or facial drooping in this same time frame.   Review of Systems  Constitutional: Negative.  Negative for activity change and appetite change.  HENT: Negative.   Eyes: Negative.   Respiratory: Negative for cough, chest tightness and shortness of breath.   Cardiovascular: Negative for chest pain, palpitations and leg swelling.  Gastrointestinal: Negative for abdominal distention, abdominal pain, constipation, diarrhea, nausea and vomiting.  Musculoskeletal: Negative.   Skin: Negative.   Neurological: Positive for dizziness, weakness and light-headedness. Negative for numbness.  Psychiatric/Behavioral: Negative.     Objective:  Physical Exam Constitutional:      Appearance: She is well-developed.  HENT:     Head: Normocephalic and atraumatic.     Right Ear: Tympanic membrane normal.     Left Ear: Tympanic membrane normal.  Cardiovascular:     Rate and Rhythm: Normal rate and  regular rhythm.  Pulmonary:     Effort: Pulmonary effort is normal. No respiratory distress.     Breath sounds: Normal breath sounds. No wheezing or rales.  Abdominal:     General: Bowel sounds are normal. There is no distension.     Palpations: Abdomen is soft.     Tenderness: There is no abdominal tenderness. There is no rebound.  Musculoskeletal:     Cervical back: Normal range of motion.  Skin:    General: Skin is warm and dry.  Neurological:     General: No focal deficit present.     Mental Status: She is alert and oriented to person, place, and time.     Coordination: Coordination normal.     Comments: Strength and sensation equal left and right arms and legs. No facial droop.      Vitals:   09/17/19 1513  BP: (!) 142/80  Pulse: 74  Temp: 98 F (36.7 C)  SpO2: 98%  Weight: 184 lb (83.5 kg)  Height: 5\' 8"  (1.727 m)    This visit occurred during the SARS-CoV-2 public health emergency.  Safety protocols were in place, including screening questions prior to the visit, additional usage of staff PPE, and extensive cleaning of exam room while observing appropriate contact time as indicated for disinfecting solutions.   Assessment & Plan:  Visit time 25 minutes in face to face communication with patient and coordination of care, additional 5 minutes spent in record review, coordination  or care, ordering tests, communicating/referring to other healthcare professionals, documenting in medical records all on the same day of the visit for total time 30 minutes spent on the visit.

## 2019-09-23 DIAGNOSIS — I951 Orthostatic hypotension: Secondary | ICD-10-CM | POA: Insufficient documentation

## 2019-09-23 NOTE — Progress Notes (Signed)
Cardiology Office Note   Date:  09/24/2019   ID:  Tevis, Conger [birth year], MRN [id number]  PCP:  Biagio Borg, MD  Cardiologist:   No primary care provider on file. Referring:  Biagio Borg, MD  Chief Complaint  Patient presents with  . Dizziness      History of Present Illness: Julia Norton is a 84 y.o. female who is referred by Biagio Borg, MD for evaluation of dizziness.   She was having weakness and dizziness and was treated recently with increased hydralazine.  Several weeks ago she had lightheadedness while she was sitting on the edge of the bed.  She felt a little bit like it was vertigo but not quite the same.  She thought she might be having a stroke.  Her legs were weak.  She called 911.  I was able to review the EKG done by the EMT and it was as reported below the same as today's EKG.  Her blood pressure was 220/100.  Shortly thereafter she saw Dr. Jenny Reichmann who stopped her spironolactone and increase the frequency and dose of her hydralazine.  More recently she has had some lightheadedness with standing.  However, her average blood pressures been in the one thirties over sixties.  The fog and lightheadedness that she was having for probably weeks after the event seems to be clearing and she is feeling much better.  She is not had any frank syncope.  She is not had any chest pressure, neck or arm discomfort.  She has had some shortness of breath with activity such as vacuuming.  She is felt a little weaker.  Of note she has never had a cardiac diagnosis or history and has had no prior cardiac work-up.  She is never been told that she had an abnormal EKG.  I don't see an EKG in our system for comparison.  Past Medical History:  Diagnosis Date  . Allergic rhinitis   . Asthma   . Breast cancer ([place]) 1987  . Hemorrhoids   . Hypertension   . Hypothyroidism     Past Surgical History:  Procedure Laterality Date  . BREAST LUMPECTOMY Right 1986   malignant  .  TONSILLECTOMY       Current Outpatient Medications  Medication Sig Dispense Refill  . aspirin 81 MG tablet Take 2 tablets by mouth once daily    . diclofenac Sodium (VOLTAREN) 1 % GEL Apply 2 g topically 4 (four) times daily. 100 g 3  . hydrALAZINE (APRESOLINE) 50 MG tablet Take 1 tablet (50 mg total) by mouth 3 (three) times daily. 270 tablet 3  . levothyroxine (SYNTHROID) 100 MCG tablet Take 100 mcg by mouth daily before breakfast.    . loratadine (CLARITIN) 10 MG tablet Take 10 mg by mouth as needed for allergies.    . Magnesium 400 MG CAPS Take by mouth.    . Multiple Vitamin (MULTIVITAMIN PO) Take 1 tablet by mouth daily.      . Omega-3 Fatty Acids (FISH OIL) 1000 MG CAPS Take 1 capsule by mouth daily.     . solifenacin (VESICARE) 10 MG tablet Take 1 tablet (10 mg total) by mouth daily. 90 tablet 3  . SYNTHROID 100 MCG tablet Take 100 mcg by mouth daily.    Marland Kitchen triamcinolone (NASACORT) 55 MCG/ACT AERO nasal inhaler Place 2 sprays into the nose daily. 3 Inhaler 3  . valsartan-hydrochlorothiazide (DIOVAN-HCT) 320-25 MG per tablet  No current facility-administered medications for this visit.    Allergies:   Penicillins    Social History:  The patient  reports that she quit smoking about 58 years ago. She has never used smokeless tobacco. She reports that she does not drink alcohol or use drugs.   Family History:  The patient's family history includes Arthritis in her father, mother, and sister; COPD in her sister; Celiac disease in her sister; Heart disease in her mother; Heart disease (age of onset: 64) in her father; Heart disease (age of onset: 22) in her sister; Hypertension in her mother and sister.    ROS:  Please see the history of present illness.   Otherwise, review of systems are positive for joint problems, vertigo.   All other systems are reviewed and negative.    PHYSICAL EXAM: VS:  BP (!) 147/75   Pulse 67   Ht 5' 8.5" (1.74 m)   Wt 186 lb 9.6 oz (84.6 kg)    SpO2 96%   BMI 27.96 kg/m  , BMI Body mass index is 27.96 kg/m. GENERAL:  Well appearing HEENT:  Pupils equal round and reactive, fundi not visualized, oral mucosa unremarkable NECK:  No jugular venous distention, waveform within normal limits, carotid upstroke brisk and symmetric, no bruits, no thyromegaly LYMPHATICS:  No cervical, inguinal adenopathy LUNGS:  Clear to auscultation bilaterally BACK:  No CVA tenderness CHEST:  Unremarkable HEART:  PMI not displaced or sustained,S1 and S2 within normal limits, no S3, no S4, no clicks, no rubs, very soft brief apical systolic murmur, no diastolic murmurs ABD:  Flat, positive bowel sounds normal in frequency in pitch, no bruits, no rebound, no guarding, no midline pulsatile mass, no hepatomegaly, no splenomegaly EXT:  2 plus pulses throughout, moderate leg edema, no cyanosis no clubbing SKIN:  No rashes no nodules NEURO:  Cranial nerves II through XII grossly intact, motor grossly intact throughout PSYCH:  Cognitively intact, oriented to person place and time    EKG:  EKG is ordered today. The ekg ordered today demonstrates sinus rhythm, rate 67, right bundle branch block, left anterior fascicular block, premature atrial contractions, no old EKGs for comparison.   Recent Labs: 09/17/2019: ALT 16; BUN 23; Creatinine, Ser 0.97; Hemoglobin 13.7; Platelets 189.0; Potassium 4.0; Sodium 134; TSH 5.71    Lipid Panel    Component Value Date/Time   CHOL 181 08/08/2019 1240   TRIG 75.0 08/08/2019 1240   HDL 61.90 08/08/2019 1240   CHOLHDL 3 08/08/2019 1240   VLDL 15.0 08/08/2019 1240   LDLCALC 104 (H) 08/08/2019 1240      Wt Readings from Last 3 Encounters:  09/24/19 186 lb 9.6 oz (84.6 kg)  09/17/19 184 lb (83.5 kg)  08/27/19 184 lb (83.5 kg)      Other studies Reviewed: Additional studies/ records that were reviewed today include: Labs. Review of the above records demonstrates:  Please see elsewhere in the note.     ASSESSMENT  AND PLAN:  DIZZINESS:   The patient had one significant episode of dizziness and was significantly hypertensive at that point.  However, she also has an abnormal EKG as reported.  It is possible that bradycardia arrhythmias contributed to this symptom.  She and I discussed bundle branch block and the morphology of her EKG.  She is going to have a 4-week event monitor.  I also would check an echocardiogram given the slight murmur and the abnormal EKG.  ORTHOSTATIC HYPOTENSION: Today she was not orthostatic in the  office.  Her blood pressure is a little bit elevated but it seems to be controlled at home.  She will continue with the meds as listed for now.  COVID EDUCATION: She has been vaccinated.  Current medicines are reviewed at length with the patient today.  The patient does not have concerns regarding medicines.  The following changes have been made:  no change  Labs/ tests ordered today include:   Orders Placed This Encounter  Procedures  . CARDIAC EVENT MONITOR  . EKG 12-Lead  . ECHOCARDIOGRAM COMPLETE     Disposition:   FU with me after the monitor      Signed, Minus Breeding, MD  09/24/2019 5:10 PM    [facility]

## 2019-09-24 ENCOUNTER — Encounter: Payer: Self-pay | Admitting: Cardiology

## 2019-09-24 ENCOUNTER — Ambulatory Visit (INDEPENDENT_AMBULATORY_CARE_PROVIDER_SITE_OTHER): Payer: Medicare Other | Admitting: Cardiology

## 2019-09-24 ENCOUNTER — Other Ambulatory Visit: Payer: Self-pay

## 2019-09-24 ENCOUNTER — Ambulatory Visit: Payer: Medicare Other

## 2019-09-24 VITALS — BP 147/75 | HR 67 | Ht 68.5 in | Wt 186.6 lb

## 2019-09-24 DIAGNOSIS — R002 Palpitations: Secondary | ICD-10-CM | POA: Diagnosis not present

## 2019-09-24 DIAGNOSIS — R42 Dizziness and giddiness: Secondary | ICD-10-CM | POA: Diagnosis not present

## 2019-09-24 DIAGNOSIS — I951 Orthostatic hypotension: Secondary | ICD-10-CM | POA: Diagnosis not present

## 2019-09-24 NOTE — Patient Instructions (Addendum)
Medication Instructions:  NO CHANGES *If you need a refill on your cardiac medications before your next appointment, please call your pharmacy*  Lab Work: NONE ORDERED THIS VISIT  Testing/Procedures: Your physician has requested that you have an echocardiogram. Echocardiography is a painless test that uses sound waves to create images of your heart. It provides your doctor with information about the size and shape of your heart and how well your heart's chambers and valves are working. This procedure takes approximately one hour. There are no restrictions for this procedure. [place] has recommended that you wear an event monitor. Event monitors are medical devices that record the heart's electrical activity. Doctors most often [place] these monitors to diagnose arrhythmias. Arrhythmias are problems with the speed or rhythm of the heartbeat. The monitor is a small, portable device. You can wear one while you do your normal daily activities. This is usually used to diagnose what is causing palpitations/syncope (passing out).  Follow-Up: At [facility], you and your health needs are our priority.  As part of our continuing mission to provide you with exceptional heart care, we have created designated Provider Care Teams.  These Care Teams include your primary Cardiologist (physician) and Advanced Practice Providers (APPs -  Physician Assistants and Nurse Practitioners) who all work together to provide you with the care you need, when you need it.  Your next appointment:   [facility] 12-05-2019 AT 1:40PM  The format for your next appointment:   In Person  Provider:   Minus Breeding, MD  Other Instructions [employer] Cardiac Event Monitor Instructions Your physician has requested you wear your cardiac event monitor for 30 DAYS. CALL [phone] TO SCHEDULE AN APPOINTMENT TO HAVE YOUR MONITOR PUT ON. [employer] may call or text to confirm a shipping address.  The monitor will be sent to a land address via UPS. [employer] will not ship a monitor to a PO BOX. It typically takes 3-5 days to receive your monitor after it has been enrolled. [employer] will assist with USPS tracking if your package is delayed. The telephone number for [employer] is [phone]. Once you have received your monitor, please review the enclosed instructions. Instruction tutorials can also be viewed under help and settings on the enclosed cell phone. Your monitor has already been registered assigning a specific monitor serial # to you.  Applying the monitor Remove cell phone from case and turn it on. The cell phone works as Dealer and needs to be within [employer] of you at all times. The cell phone will need to be charged on a daily basis. We recommend you plug the cell phone into the enclosed charger at your bedside table every night.  Monitor batteries: You will receive two monitor batteries labelled #1 and #2. These are your recorders. Plug battery #2 onto the second connection on the enclosed charger. Keep one battery on the charger at all times. This will keep the monitor battery deactivated. It will also keep it fully charged for when you need to switch your monitor batteries. A small light will be blinking on the battery emblem when it is charging. The light on the battery emblem will remain on when the battery is fully charged.  Open package of a Monitor strip. Insert battery #1 into black hood on strip and gently squeeze monitor battery onto connection as indicated in instruction booklet. Set aside while preparing skin.  Choose location for your strip, vertical or horizontal, as indicated in  the instruction booklet. Shave to remove all hair from location. There cannot be any lotions, oils, powders, or colognes on skin where monitor is to be applied. Wipe skin clean with enclosed Saline wipe. Dry skin completely.  Peel paper labeled #1 off the back of  the Monitor strip exposing the adhesive. Place the monitor on the chest in the vertical or horizontal position shown in the instruction booklet. One arrow on the monitor strip must be pointing upward. Carefully remove paper labeled #2, attaching remainder of strip to your skin. Try not to create any folds or wrinkles in the strip as you apply it.  Firmly press and release the circle in the center of the monitor battery. You will hear a small beep. This is turning the monitor battery on. The heart emblem on the monitor battery will light up every 5 seconds if the monitor battery in turned on and connected to the patient securely. Do not push and hold the circle down as this turns the monitor battery off. The cell phone will locate the monitor battery. A screen will appear on the cell phone checking the connection of your monitor strip. This may read poor connection initially but change to good connection within the next minute. Once your monitor accepts the connection you will hear a series of 3 beeps followed by a climbing crescendo of beeps. A screen will appear on the cell phone showing the two monitor strip placement options. Touch the picture that demonstrates where you applied the monitor strip.  Your monitor strip and battery are waterproof. You are able to shower, bathe, or swim with the monitor on. They just ask you do not submerge deeper than 3 feet underwater. We recommend removing the monitor if you are swimming in a lake, river, or ocean.  Your monitor battery will need to be switched to a fully charged monitor battery approximately once a week. The cell phone will alert you of an action which needs to be made.  On the cell phone, tap for details to reveal connection status, monitor battery status, and cell phone battery status. The green dots indicates your monitor is in good status. A red dot indicates there is something that needs your attention.  To record a symptom, click  the circle on the monitor battery. In 30-60 seconds a list of symptoms will appear on the cell phone. Select your symptom and tap save. Your monitor will record a sustained or significant arrhythmia regardless of you clicking the button. Some patients do not feel the heart rhythm irregularities. [employer] will notify us of any serious or critical events.  Refer to instruction booklet for instructions on switching batteries, changing strips, the Do not disturb or Pause features, or any additional questions.  Call [employer] at [phone], to confirm your monitor is transmitting and record your baseline. They will answer any questions you may have regarding the monitor instructions at that time.  Returning the monitor to [place] all equipment back into blue box. Peel off strip of paper to expose adhesive and close box securely. There is a prepaid UPS shipping label on this box. Drop in a UPS drop box, or at a UPS facility like [employer]. You may also contact [employer] to arrange UPS to pick up monitor package at your home.

## 2019-09-25 ENCOUNTER — Encounter: Payer: Self-pay | Admitting: *Deleted

## 2019-09-25 NOTE — Progress Notes (Signed)
Patient ID: Julia Norton, female   DOB: 15-Nov-1935, 84 y.o.   MRN: [id number] Patient enrolled for [employer] to ship a 30 day cardiac event monitor to her home.

## 2019-10-02 ENCOUNTER — Other Ambulatory Visit: Payer: Self-pay

## 2019-10-02 ENCOUNTER — Ambulatory Visit
Admission: RE | Admit: 2019-10-02 | Discharge: 2019-10-02 | Disposition: A | Discharge status: Home / Self Care | Payer: Medicare Other | Source: Ambulatory Visit | Attending: Internal Medicine | Admitting: Internal Medicine

## 2019-10-02 DIAGNOSIS — Z1231 Encounter for screening mammogram for malignant neoplasm of breast: Secondary | ICD-10-CM

## 2019-10-04 ENCOUNTER — Ambulatory Visit
Admission: RE | Admit: 2019-10-04 | Discharge: 2019-10-04 | Disposition: A | Discharge status: Home / Self Care | Payer: Medicare Other | Source: Ambulatory Visit | Attending: Internal Medicine | Admitting: Internal Medicine

## 2019-10-04 DIAGNOSIS — R29818 Other symptoms and signs involving the nervous system: Secondary | ICD-10-CM

## 2019-10-07 ENCOUNTER — Telehealth: Payer: Self-pay | Admitting: Cardiology

## 2019-10-07 NOTE — Telephone Encounter (Signed)
New Medssage:    Pt says she is having a hard time getting her Monitor out otf the hard cover. She wants to know if she can come here and get some help with it please?

## 2019-10-08 ENCOUNTER — Encounter (INDEPENDENT_AMBULATORY_CARE_PROVIDER_SITE_OTHER): Payer: Medicare Other

## 2019-10-08 ENCOUNTER — Other Ambulatory Visit: Payer: Self-pay

## 2019-10-08 DIAGNOSIS — R002 Palpitations: Secondary | ICD-10-CM

## 2019-10-08 DIAGNOSIS — R42 Dizziness and giddiness: Secondary | ICD-10-CM | POA: Diagnosis not present

## 2019-10-10 NOTE — Telephone Encounter (Signed)
Patient came into the office on 6/22 monitor was applied and instructions gone over.

## 2019-10-17 ENCOUNTER — Other Ambulatory Visit (HOSPITAL_COMMUNITY): Payer: Medicare Other

## 2019-10-24 ENCOUNTER — Other Ambulatory Visit: Payer: Self-pay

## 2019-10-24 ENCOUNTER — Ambulatory Visit (HOSPITAL_COMMUNITY): Payer: Medicare Other | Attending: Cardiology

## 2019-10-24 DIAGNOSIS — R42 Dizziness and giddiness: Secondary | ICD-10-CM | POA: Diagnosis not present

## 2019-10-24 DIAGNOSIS — J45909 Unspecified asthma, uncomplicated: Secondary | ICD-10-CM | POA: Insufficient documentation

## 2019-10-24 DIAGNOSIS — E039 Hypothyroidism, unspecified: Secondary | ICD-10-CM | POA: Insufficient documentation

## 2019-10-24 DIAGNOSIS — I517 Cardiomegaly: Secondary | ICD-10-CM | POA: Diagnosis not present

## 2019-10-24 DIAGNOSIS — I10 Essential (primary) hypertension: Secondary | ICD-10-CM | POA: Insufficient documentation

## 2019-10-24 DIAGNOSIS — Z853 Personal history of malignant neoplasm of breast: Secondary | ICD-10-CM | POA: Diagnosis not present

## 2019-12-04 NOTE — Progress Notes (Deleted)
Cardiology Office Note   Date:  12/04/2019   ID:  Embree, Brawley 07/28/1935, MRN [id number]  PCP:  Biagio Borg, MD  Cardiologist:   No primary care provider on file. Referring:  Biagio Borg, MD  No chief complaint on file.     History of Present Illness: Julia Norton is a 84 y.o. female who is referred by Biagio Borg, MD for evaluation of dizziness.   After my first visit with her I sent her for a monitor.  She did not have any arrhythmias.  Echo was unremarkable.  She returns to discuss.    ***  *** She was having weakness and dizziness and was treated recently with increased hydralazine.  Several weeks ago she had lightheadedness while she was sitting on the edge of the bed.  She felt a little bit like it was vertigo but not quite the same.  She thought she might be having a stroke.  Her legs were weak.  She called 911.  I was able to review the EKG done by the EMT and it was as reported below the same as today's EKG.  Her blood pressure was 220/100.  Shortly thereafter she saw Dr. Jenny Reichmann who stopped her spironolactone and increase the frequency and dose of her hydralazine.  More recently she has had some lightheadedness with standing.  However, her average blood pressures been in the one thirties over sixties.  The fog and lightheadedness that she was having for probably weeks after the event seems to be clearing and she is feeling much better.  She is not had any frank syncope.  She is not had any chest pressure, neck or arm discomfort.  She has had some shortness of breath with activity such as vacuuming.  She is felt a little weaker.  Of note she has never had a cardiac diagnosis or history and has had no prior cardiac work-up.  She is never been told that she had an abnormal EKG.  I don't see an EKG in our system for comparison.  Past Medical History:  Diagnosis Date  . Allergic rhinitis   . Asthma   . Breast cancer ([place]) 1987  . Hemorrhoids   . Hypertension     . Hypothyroidism     Past Surgical History:  Procedure Laterality Date  . BREAST LUMPECTOMY Right 1986   malignant  . TONSILLECTOMY       Current Outpatient Medications  Medication Sig Dispense Refill  . aspirin 81 MG tablet Take 2 tablets by mouth once daily    . diclofenac Sodium (VOLTAREN) 1 % GEL Apply 2 g topically 4 (four) times daily. 100 g 3  . hydrALAZINE (APRESOLINE) 50 MG tablet Take 1 tablet (50 mg total) by mouth 3 (three) times daily. 270 tablet 3  . levothyroxine (SYNTHROID) 100 MCG tablet Take 100 mcg by mouth daily before breakfast.    . loratadine (CLARITIN) 10 MG tablet Take 10 mg by mouth as needed for allergies.    . Magnesium 400 MG CAPS Take by mouth.    . Multiple Vitamin (MULTIVITAMIN PO) Take 1 tablet by mouth daily.      . Omega-3 Fatty Acids (FISH OIL) 1000 MG CAPS Take 1 capsule by mouth daily.     . solifenacin (VESICARE) 10 MG tablet Take 1 tablet (10 mg total) by mouth daily. 90 tablet 3  . SYNTHROID 100 MCG tablet Take 100 mcg by mouth daily.    Marland Kitchen  triamcinolone (NASACORT) 55 MCG/ACT AERO nasal inhaler Place 2 sprays into the nose daily. 3 Inhaler 3  . valsartan-hydrochlorothiazide (DIOVAN-HCT) 320-25 MG per tablet      No current facility-administered medications for this visit.    Allergies:   Penicillins    ROS:  Please see the history of present illness.   Otherwise, review of systems are positive for ***.   All other systems are reviewed and negative.    PHYSICAL EXAM: VS:  There were no vitals taken for this visit. , BMI There is no height or weight on file to calculate BMI. GENERAL:  Well appearing NECK:  No jugular venous distention, waveform within normal limits, carotid upstroke brisk and symmetric, no bruits, no thyromegaly LUNGS:  Clear to auscultation bilaterally CHEST:  Unremarkable HEART:  PMI not displaced or sustained,S1 and S2 within normal limits, no S3, no S4, no clicks, no rubs, *** murmurs ABD:  Flat, positive bowel  sounds normal in frequency in pitch, no bruits, no rebound, no guarding, no midline pulsatile mass, no hepatomegaly, no splenomegaly EXT:  2 plus pulses throughout, no edema, no cyanosis no clubbing    ***GENERAL:  Well appearing HEENT:  Pupils equal round and reactive, fundi not visualized, oral mucosa unremarkable NECK:  No jugular venous distention, waveform within normal limits, carotid upstroke brisk and symmetric, no bruits, no thyromegaly LYMPHATICS:  No cervical, inguinal adenopathy LUNGS:  Clear to auscultation bilaterally BACK:  No CVA tenderness CHEST:  Unremarkable HEART:  PMI not displaced or sustained,S1 and S2 within normal limits, no S3, no S4, no clicks, no rubs, very soft brief apical systolic murmur, no diastolic murmurs ABD:  Flat, positive bowel sounds normal in frequency in pitch, no bruits, no rebound, no guarding, no midline pulsatile mass, no hepatomegaly, no splenomegaly EXT:  2 plus pulses throughout, moderate leg edema, no cyanosis no clubbing SKIN:  No rashes no nodules NEURO:  Cranial nerves II through XII grossly intact, motor grossly intact throughout PSYCH:  Cognitively intact, oriented to person place and time    EKG:  EKG is *** ordered today. The ekg ordered today demonstrates sinus rhythm, rate ***, right bundle branch block, left anterior fascicular block, premature atrial contractions, no old EKGs for comparison.   Recent Labs: 09/17/2019: ALT 16; BUN 23; Creatinine, Ser 0.97; Hemoglobin 13.7; Platelets 189.0; Potassium 4.0; Sodium 134; TSH 5.71    Lipid Panel    Component Value Date/Time   CHOL 181 08/08/2019 1240   TRIG 75.0 08/08/2019 1240   HDL 61.90 08/08/2019 1240   CHOLHDL 3 08/08/2019 1240   VLDL 15.0 08/08/2019 1240   LDLCALC 104 (H) 08/08/2019 1240      Wt Readings from Last 3 Encounters:  09/24/19 186 lb 9.6 oz (84.6 kg)  09/17/19 184 lb (83.5 kg)  08/27/19 184 lb (83.5 kg)      Other studies Reviewed: Additional  studies/ records that were reviewed today include: *** Review of the above records demonstrates:  Please see elsewhere in the note.     ASSESSMENT AND PLAN:  DIZZINESS:   ***   The patient had one significant episode of dizziness and was significantly hypertensive at that point.  However, she also has an abnormal EKG as reported.  It is possible that bradycardia arrhythmias contributed to this symptom.  She and I discussed bundle branch block and the morphology of her EKG.  She is going to have a 4-week event monitor.  I also would check an echocardiogram given the  slight murmur and the abnormal EKG.  ORTHOSTATIC HYPOTENSION:   ***  day she was not orthostatic in the office.  Her blood pressure is a little bit elevated but it seems to be controlled at home.  She will continue with the meds as listed for now.  COVID EDUCATION: She has been vaccinated.  Current medicines are reviewed at length with the patient today.  The patient does not have concerns regarding medicines.  The following changes have been made:  no change  Labs/ tests ordered today include:   No orders of the defined types were placed in this encounter.    Disposition:   FU with me after the monitor      Signed, Minus Breeding, MD  12/04/2019 8:16 PM    [facility]

## 2019-12-05 ENCOUNTER — Ambulatory Visit (INDEPENDENT_AMBULATORY_CARE_PROVIDER_SITE_OTHER): Payer: Medicare Other | Admitting: Cardiology

## 2019-12-05 ENCOUNTER — Other Ambulatory Visit: Payer: Self-pay

## 2019-12-05 ENCOUNTER — Encounter: Payer: Self-pay | Admitting: Cardiology

## 2019-12-05 VITALS — BP 130/72 | HR 75 | Temp 97.5°F | Ht 68.0 in | Wt 179.0 lb

## 2019-12-05 DIAGNOSIS — R0602 Shortness of breath: Secondary | ICD-10-CM

## 2019-12-05 DIAGNOSIS — R0989 Other specified symptoms and signs involving the circulatory and respiratory systems: Secondary | ICD-10-CM | POA: Diagnosis not present

## 2019-12-05 DIAGNOSIS — R42 Dizziness and giddiness: Secondary | ICD-10-CM

## 2019-12-05 DIAGNOSIS — I951 Orthostatic hypotension: Secondary | ICD-10-CM

## 2019-12-05 NOTE — Progress Notes (Signed)
Cardiology Office Note   Date:  12/05/2019   ID:  Julia Norton, DOB 1935/09/23, MRN [id number]  PCP:  Biagio Borg, MD  Cardiologist:   No primary care provider on file. Referring:  Biagio Borg, MD  Chief Complaint  Patient presents with  . Follow-up      History of Present Illness: Julia Norton is a 84 y.o. female who is referred by Biagio Borg, MD for evaluation of dizziness.   After my first visit with her I sent her for a monitor.  She did not have any arrhythmias.  Echo was unremarkable.  She returns to discuss.    She says she still having dizzy episodes.  However, she is not having any of the severe episodes that she had when she called EMT.  She is concerned because she is continuing to have shortness of breath with mild to moderate activity.  This is just stopped that she would be doing around the house.  She is not having any resting shortness of breath, PND or orthopnea.  She is not having any new palpitations, presyncope or syncope.  She does describe some occasional shoulder discomfort.  This is bilateral.  Some of it is with movement but she has been concerned because of her family history of coronary disease.  She is not describing any chest discomfort.  She does have neck discomfort and posterior neck.  She was told in the past that she probably needed to wear a collar and she has 1.  She does think it hurts with movement.  She is not having any new palpitations, presyncope or syncope.  She has had no new weight gain or edema.  Past Medical History:  Diagnosis Date  . Allergic rhinitis   . Asthma   . Breast cancer ([place]) 1987  . Hemorrhoids   . Hypertension   . Hypothyroidism     Past Surgical History:  Procedure Laterality Date  . BREAST LUMPECTOMY Right 1986   malignant  . TONSILLECTOMY       Current Outpatient Medications  Medication Sig Dispense Refill  . aspirin 81 MG tablet Take 2 tablets by mouth once daily    . diclofenac Sodium  (VOLTAREN) 1 % GEL Apply 2 g topically 4 (four) times daily. 100 g 3  . hydrALAZINE (APRESOLINE) 50 MG tablet Take 1 tablet (50 mg total) by mouth 3 (three) times daily. 270 tablet 3  . levothyroxine (SYNTHROID) 100 MCG tablet Take 100 mcg by mouth daily before breakfast.    . loratadine (CLARITIN) 10 MG tablet Take 10 mg by mouth as needed for allergies.    . Magnesium 400 MG CAPS Take by mouth.    . Multiple Vitamin (MULTIVITAMIN PO) Take 1 tablet by mouth daily.      . Omega-3 Fatty Acids (FISH OIL) 1000 MG CAPS Take 1 capsule by mouth daily.     . solifenacin (VESICARE) 10 MG tablet Take 1 tablet (10 mg total) by mouth daily. 90 tablet 3  . SYNTHROID 100 MCG tablet Take 100 mcg by mouth daily.    Marland Kitchen triamcinolone (NASACORT) 55 MCG/ACT AERO nasal inhaler Place 2 sprays into the nose daily. 3 Inhaler 3  . valsartan-hydrochlorothiazide (DIOVAN-HCT) 320-25 MG per tablet      No current facility-administered medications for this visit.    Allergies:   Penicillins    ROS:  Please see the history of present illness.   Otherwise, review of systems are positive  for none.   All other systems are reviewed and negative.    PHYSICAL EXAM: VS:  BP 130/72   Pulse 75   Temp (!) 97.5 F (36.4 C)   Ht 5\' 8"  (1.727 m)   Wt 179 lb (81.2 kg)   SpO2 95%   BMI 27.22 kg/m  , BMI Body mass index is 27.22 kg/m. GENERAL:  Well appearing NECK:  No jugular venous distention, waveform within normal limits, carotid upstroke brisk and symmetric, soft bruits, no thyromegaly LUNGS:  Clear to auscultation bilaterally CHEST:  Unremarkable HEART:  PMI not displaced or sustained,S1 and S2 within normal limits, no S3, no S4, no clicks, no rubs, soft apical systolic nonradiating, no diastolic murmurs ABD:  Flat, positive bowel sounds normal in frequency in pitch, no bruits, no rebound, no guarding, no midline pulsatile mass, no hepatomegaly, no splenomegaly EXT:  2 plus pulses throughout, no edema, no cyanosis no  clubbing   EKG:  EKG is not ordered today.    Recent Labs: 09/17/2019: ALT 16; BUN 23; Creatinine, Ser 0.97; Hemoglobin 13.7; Platelets 189.0; Potassium 4.0; Sodium 134; TSH 5.71    Lipid Panel    Component Value Date/Time   CHOL 181 08/08/2019 1240   TRIG 75.0 08/08/2019 1240   HDL 61.90 08/08/2019 1240   CHOLHDL 3 08/08/2019 1240   VLDL 15.0 08/08/2019 1240   LDLCALC 104 (H) 08/08/2019 1240      Wt Readings from Last 3 Encounters:  12/05/19 179 lb (81.2 kg)  09/24/19 186 lb 9.6 oz (84.6 kg)  09/17/19 184 lb (83.5 kg)      Other studies Reviewed: Additional studies/ records that were reviewed today include: Echo and monitor.  Review of the above records demonstrates:  Please see elsewhere in the note.     ASSESSMENT AND PLAN:  DIZZINESS:     There is no clear cardiac etiology to this.  No change in therapy.  DYSPNEA: I will screen for coronary artery disease.  She has some gait disturbance and walks with a cane would not be able walk on a treadmill.  Therefore, she will have a [employer].  BRUITS: I will check carotid Dopplers.  COVID EDUCATION: She has been vaccinated.  We talked about the booster shot.  Current medicines are reviewed at length with the patient today.  The patient does not have concerns regarding medicines.  The following changes have been made:  no change  Labs/ tests ordered today include:   No orders of the defined types were placed in this encounter.    Disposition:   FU with me in 1 year or sooner based on the results of the above.   Signed, Minus Breeding, MD  12/05/2019 2:13 PM    [facility]

## 2019-12-05 NOTE — Patient Instructions (Signed)
Medication Instructions:  No changes *If you need a refill on your cardiac medications before your next appointment, please call your pharmacy*  Lab Work: None ordered this visit  Testing/Procedures: Your physician has requested that you have a carotid duplex. This test is an ultrasound of the carotid arteries in your neck. It looks at blood flow through these arteries that supply the brain with blood. Allow one hour for this exam. There are no restrictions or special instructions.  Your physician has requested that you have a lexiscan myoview. For further information please visit [email or web address]. Please follow instruction sheet, as given.  Follow-Up: At [facility], you and your health needs are our priority.  As part of our continuing mission to provide you with exceptional heart care, we have created designated Provider Care Teams.  These Care Teams include your primary Cardiologist (physician) and Advanced Practice Providers (APPs -  Physician Assistants and Nurse Practitioners) who all work together to provide you with the care you need, when you need it.  Your next appointment:   12 month(s)  You will receive a reminder letter in the mail two months in advance. If you don't receive a letter, please call our office to schedule the follow-up appointment.  The format for your next appointment:   In Person  Provider:   Minus Breeding, MD

## 2019-12-18 ENCOUNTER — Telehealth: Payer: Self-pay | Admitting: Internal Medicine

## 2019-12-18 NOTE — Telephone Encounter (Signed)
   1.Medication Requested:SYNTHROID 100 MCG tablet  2. Pharmacy (Name, [place], [place]):[facility] [place] [facility]  3. On Med List: yes  4. Last Visit with PCP:   5. Next visit date with PCP:   Agent: Please be advised that RX refills may take up to 3 business days. We ask that you follow-up with your pharmacy.

## 2019-12-20 ENCOUNTER — Telehealth (HOSPITAL_COMMUNITY): Payer: Self-pay

## 2019-12-20 ENCOUNTER — Other Ambulatory Visit: Payer: Self-pay

## 2019-12-20 NOTE — Telephone Encounter (Signed)
Encounter complete. 

## 2019-12-24 ENCOUNTER — Other Ambulatory Visit: Payer: Self-pay

## 2019-12-24 MED ORDER — SYNTHROID 100 MCG PO TABS: 100.0000 ug | ORAL_TABLET | Freq: Every day | ORAL | 2 refills | Status: DC

## 2019-12-25 ENCOUNTER — Ambulatory Visit (HOSPITAL_COMMUNITY)
Admission: RE | Admit: 2019-12-25 | Discharge: 2019-12-25 | Disposition: A | Discharge status: Home / Self Care | Payer: Medicare Other | Source: Ambulatory Visit | Attending: Cardiovascular Disease | Admitting: Cardiovascular Disease

## 2019-12-25 ENCOUNTER — Other Ambulatory Visit: Payer: Self-pay

## 2019-12-25 ENCOUNTER — Ambulatory Visit (HOSPITAL_BASED_OUTPATIENT_CLINIC_OR_DEPARTMENT_OTHER)
Admission: RE | Admit: 2019-12-25 | Discharge: 2019-12-25 | Disposition: A | Discharge status: Home / Self Care | Payer: Medicare Other | Source: Ambulatory Visit | Attending: Cardiovascular Disease | Admitting: Cardiovascular Disease

## 2019-12-25 DIAGNOSIS — R0602 Shortness of breath: Secondary | ICD-10-CM | POA: Insufficient documentation

## 2019-12-25 DIAGNOSIS — R0989 Other specified symptoms and signs involving the circulatory and respiratory systems: Secondary | ICD-10-CM

## 2019-12-25 LAB — MYOCARDIAL PERFUSION IMAGING
LV dias vol: 90 mL (ref 46–106)
LV sys vol: 31 mL
Peak HR: 88 {beats}/min
Rest HR: 62 {beats}/min
SDS: 1
SRS: 1
SSS: 2
TID: 1.26

## 2019-12-25 MED ORDER — REGADENOSON 0.4 MG/5ML IV SOLN
0.4000 mg | Freq: Once | INTRAVENOUS | Status: AC
  Administered 2019-12-25: 0.4 mg via INTRAVENOUS

## 2019-12-25 MED ORDER — TECHNETIUM TC 99M TETROFOSMIN IV KIT
10.6000 | PACK | Freq: Once | INTRAVENOUS | Status: AC | PRN
  Administered 2019-12-25: 10.6 via INTRAVENOUS
  Filled 2019-12-25: qty 11

## 2019-12-25 MED ORDER — TECHNETIUM TC 99M TETROFOSMIN IV KIT
31.4000 | PACK | Freq: Once | INTRAVENOUS | Status: AC | PRN
  Administered 2019-12-25: 31.4 via INTRAVENOUS
  Filled 2019-12-25: qty 32

## 2020-01-20 ENCOUNTER — Ambulatory Visit (INDEPENDENT_AMBULATORY_CARE_PROVIDER_SITE_OTHER): Payer: Medicare Other

## 2020-01-20 ENCOUNTER — Other Ambulatory Visit: Payer: Self-pay

## 2020-01-20 DIAGNOSIS — Z23 Encounter for immunization: Secondary | ICD-10-CM | POA: Diagnosis not present

## 2020-01-31 ENCOUNTER — Encounter: Payer: Self-pay | Admitting: Internal Medicine

## 2020-02-01 ENCOUNTER — Other Ambulatory Visit: Payer: Self-pay | Admitting: Internal Medicine

## 2020-02-01 MED ORDER — VALSARTAN-HYDROCHLOROTHIAZIDE 320-25 MG PO TABS
1.0000 | ORAL_TABLET | Freq: Every day | ORAL | 1 refills | Status: DC
Start: 2020-02-01 — End: 2020-07-30

## 2020-02-04 ENCOUNTER — Ambulatory Visit: Payer: Medicare Other | Attending: Internal Medicine

## 2020-02-04 ENCOUNTER — Other Ambulatory Visit (HOSPITAL_BASED_OUTPATIENT_CLINIC_OR_DEPARTMENT_OTHER): Payer: Self-pay | Admitting: Internal Medicine

## 2020-02-04 DIAGNOSIS — Z23 Encounter for immunization: Secondary | ICD-10-CM

## 2020-02-04 NOTE — Progress Notes (Signed)
   Covid-19 Vaccination Clinic  Name:  GAYNEL SCHAAFSMA    MRN: [id number] DOB: 04-15-1936  02/04/2020  Ms. Scotti was observed post Covid-19 immunization for 15 minutes without incident. She was provided with Vaccine Information Sheet and instruction to access the V-Safe system.   Ms. Rochon was instructed to call 911 with any severe reactions post vaccine: Marland Kitchen Difficulty breathing  . Swelling of face and throat  . A fast heartbeat  . A bad rash all over body  . Dizziness and weakness

## 2020-02-11 MED FILL — PFIZER-BIONTECH COVID-19 VA: 30 | 1 days supply | Qty: 0 | Fill #0

## 2020-02-21 ENCOUNTER — Telehealth: Payer: Self-pay | Admitting: Internal Medicine

## 2020-02-21 DIAGNOSIS — R42 Dizziness and giddiness: Secondary | ICD-10-CM

## 2020-02-21 NOTE — Telephone Encounter (Signed)
Sent to [place] to advise. °

## 2020-02-21 NOTE — Telephone Encounter (Signed)
According to her chart, he sent in 90 day supply of this medication to [facility] on 12/24/19 with refills. She should have plenty of medication?

## 2020-02-21 NOTE — Telephone Encounter (Signed)
Will put in the referral for her; they will see her as soon as possible but I would plan on keeping appt with Dr. Jenny Reichmann for 11/22.

## 2020-02-21 NOTE — Telephone Encounter (Signed)
   Patient calling to request order for generic SYNTHROID 100 MCG tablet be sent to [facility]. Patient states the cost for 90 day is cheaper if generic

## 2020-02-21 NOTE — Telephone Encounter (Signed)
Sent to [facility].

## 2020-02-21 NOTE — Telephone Encounter (Signed)
Patient requesting a referral to [facility] for possible vertigo  [phone]  Hoping to get in to see them sooner then Dr. Jenny Reichmann (scheduled with Dr. Jenny Reichmann 11.22.21

## 2020-02-28 ENCOUNTER — Telehealth: Payer: Self-pay | Admitting: Internal Medicine

## 2020-02-28 NOTE — Telephone Encounter (Signed)
LVM for pt to rtn my call to schedule AWV-I with [facility]. Please schedule this appt if pt calls the office.  ?

## 2020-03-06 ENCOUNTER — Telehealth: Payer: Self-pay | Admitting: Internal Medicine

## 2020-03-06 ENCOUNTER — Other Ambulatory Visit: Payer: Self-pay

## 2020-03-06 MED ORDER — SYNTHROID 100 MCG PO TABS: 100.0000 ug | ORAL_TABLET | Freq: Every day | ORAL | 2 refills | Status: DC

## 2020-03-06 NOTE — Telephone Encounter (Signed)
SYNTHROID 100 MCG tablet Patient states she spoke with the pharmacy and they said Dr. Jenny Reichmann needs to send in the prescription with the code of brand only at generic price.

## 2020-03-09 ENCOUNTER — Ambulatory Visit: Payer: Medicare Other | Admitting: Internal Medicine

## 2020-03-20 ENCOUNTER — Ambulatory Visit: Payer: Medicare Other

## 2020-03-25 ENCOUNTER — Other Ambulatory Visit: Payer: Self-pay | Admitting: Internal Medicine

## 2020-03-25 MED ORDER — LEVOTHYROXINE SODIUM 100 MCG PO TABS: 100.0000 ug | ORAL_TABLET | Freq: Every day | ORAL | 3 refills | Status: DC

## 2020-03-31 ENCOUNTER — Ambulatory Visit: Payer: Medicare Other

## 2020-05-12 ENCOUNTER — Encounter: Payer: Self-pay | Admitting: Neurology

## 2020-05-12 ENCOUNTER — Ambulatory Visit (INDEPENDENT_AMBULATORY_CARE_PROVIDER_SITE_OTHER): Payer: Medicare Other | Admitting: Neurology

## 2020-05-12 ENCOUNTER — Telehealth: Payer: Self-pay | Admitting: Neurology

## 2020-05-12 VITALS — Ht 68.0 in | Wt 181.0 lb

## 2020-05-12 DIAGNOSIS — R42 Dizziness and giddiness: Secondary | ICD-10-CM | POA: Diagnosis not present

## 2020-05-12 DIAGNOSIS — H8110 Benign paroxysmal vertigo, unspecified ear: Secondary | ICD-10-CM | POA: Diagnosis not present

## 2020-05-12 DIAGNOSIS — R2689 Other abnormalities of gait and mobility: Secondary | ICD-10-CM

## 2020-05-12 NOTE — Telephone Encounter (Signed)
[employer] medicare order sent to [facility]. No auth they will reach out to the patient to schedule.  

## 2020-05-12 NOTE — Progress Notes (Signed)
[id number]    Provider:  Dr Jaynee Eagles Requesting Provider: Marrian Salvage,* Primary Care Provider:  Biagio Borg, MD  CC:  "Funky head"  HPI:  Julia Norton is a 85 y.o. female here as requested by Marrian Salvage,* for vertigo. PMHx hypothyroidism, HTN, Erysipelas. She is here alone, lovely and funny lady stating she has a "funky head" referring to vertigo and dizziness.  Mother's day last year, she was leaning over her husband and she sat up and the vertigo started the room was spinning it was bad. She has had that before. Her head was spinning. She called 911, she thought she was having a stroke, when they got there she was sent for CT of the head, she had a holter monitor, echo of the heart, never found anything. She went to vestibular therapy in the past(15 years ago) that significantly helped but not since this episode. The vertigo and residual have continued since May 2021, slightly improved but not resolved, she does not walk the steps without holding on to something, she goes down backwards which helps with her dizziness and vertigo. Her head doesn't feel right. She feels she is off balance, feels like she is walking on a boat and even when she is sitting it feels like that. No associated headaches, visual changes. Yesterday her husband had his laptop off and light bothered her but otherwise no headache, a head "funk", no memory issues, no falls, no nausea, no focal weakness.    Reviewed notes, labs and imaging from outside physicians, which showed:  CT head 10/04/2019: personally reviewed images and agree IMPRESSION: 1.  No acute findings.  2.  Minimal chronic ischemic microvascular disease.  Review of Systems: Patient complains of symptoms per HPI as well as the following symptoms: vertigo. Pertinent negatives and positives per HPI. All others negative.   Social History   Socioeconomic History  . Marital status: Married    Spouse name:  Not on file  . Number of children: 3  . Years of education: Not on file  . Highest education level: High school graduate  Occupational History  . Not on file  Tobacco Use  . Smoking status: Former Smoker    Quit date: 04/18/1961    Years since quitting: 59.1  . Smokeless tobacco: Never Used  Substance and Sexual Activity  . Alcohol use: Never  . Drug use: Never  . Sexual activity: Not Currently  Other Topics Concern  . Not on file  Social History Narrative   Lives with husband.  She has three children and 7 grands and one great.       Caffeine: very little (chocolate), drinks decaf coffee   Social Determinants of Health   Financial Resource Strain: Not on file  Food Insecurity: Not on file  Transportation Needs: Not on file  Physical Activity: Not on file  Stress: Not on file  Social Connections: Not on file  Intimate Partner Violence: Not on file    Family History  Problem Relation Age of Onset  . Celiac disease Sister   . Heart disease Sister 11       heart attack, died 66  . Arthritis Sister   . COPD Sister   . Hypertension Sister   . Heart disease Mother        CHF  . Arthritis Mother   . Hypertension Mother   . Heart disease Father 79       heart attack  . Arthritis Father  Past Medical History:  Diagnosis Date  . Allergic rhinitis   . Asthma   . Breast cancer ([place]) 1987  . Erysipelas   . Hemorrhoids   . Hypertension   . Hypothyroidism     Patient Active Problem List   Diagnosis Date Noted  . Benign paroxysmal positional vertigo 05/12/2020  . Orthostatic hypotension 09/23/2019  . Dizziness 09/17/2019  . OAB (overactive bladder) 08/27/2019  . Pain of left thumb 08/27/2019  . Family history of factor V Leiden mutation 08/11/2019  . Peripheral edema 08/11/2019  . Preventative health care 08/08/2019  . Hypothyroidism   . Hypertension   . Asthma   . Allergic rhinitis   . hx: right breast cancer 11/25/2011    Past Surgical History:   Procedure Laterality Date  . BREAST LUMPECTOMY Right 1986   malignant  . TONSILLECTOMY      Current Outpatient Medications  Medication Sig Dispense Refill  . aspirin 81 MG tablet Take 2 tablets by mouth once daily    . augmented betamethasone dipropionate (DIPROLENE-AF) 0.05 % cream Apply topically. 1-2 times per day    . B Complex Vitamins (VITAMIN B COMPLEX PO) Take by mouth daily.    . Cholecalciferol (VITAMIN D3) 25 MCG (1000 UT) CAPS Take by mouth daily.    . hydrALAZINE (APRESOLINE) 50 MG tablet Take 1 tablet (50 mg total) by mouth 3 (three) times daily. 270 tablet 3  . levothyroxine (SYNTHROID) 100 MCG tablet Take 1 tablet (100 mcg total) by mouth daily. 90 tablet 3  . loratadine (CLARITIN) 10 MG tablet Take 10 mg by mouth daily.    . Magnesium 400 MG CAPS Take by mouth.    . Multiple Vitamin (MULTIVITAMIN PO) Take 1 tablet by mouth daily.    . Omega-3 Fatty Acids (FISH OIL) 1000 MG CAPS Take 1 capsule by mouth daily.    . solifenacin (VESICARE) 10 MG tablet Take 1 tablet (10 mg total) by mouth daily. 90 tablet 3  . valsartan-hydrochlorothiazide (DIOVAN-HCT) 320-25 MG tablet Take 1 tablet by mouth daily. 90 tablet 1   No current facility-administered medications for this visit.    Allergies as of 05/12/2020 - Review Complete 05/12/2020  Allergen Reaction Noted  . Penicillins  08/05/2010    Vitals: Ht 5\' 8"  (1.727 m)   Wt 181 lb (82.1 kg)   BMI 27.52 kg/m  Last Weight:  Wt Readings from Last 1 Encounters:  05/12/20 181 lb (82.1 kg)   Last Height:   Ht Readings from Last 1 Encounters:  05/12/20 5\' 8"  (1.727 m)   Physical exam: Exam: Gen: NAD, conversant, well nourised, well groomed                     CV: RRR, no MRG. No Carotid Bruits. +peripheral edema, warm, nontender Eyes: Conjunctivae clear without exudates or hemorrhage  Neuro: Detailed Neurologic Exam  Speech:    Speech is normal; fluent and spontaneous with normal comprehension.  Cognition:    The  patient is oriented to person, place, and time;     recent and remote memory intact;     language fluent;     normal attention, concentration,     fund of knowledge Cranial Nerves:    The pupils are equal, round, and reactive to light. Pupils too small to visualize fundi. Patient reported decreased vision right eye howeverVisual fields are full to finger confrontation bilaterally. Extraocular movements are intact. Trigeminal sensation is intact and the muscles of mastication  are normal. Right eyelid retraction otherwise the face is symmetric. The palate elevates in the midline. Hearing intact. Voice is normal. Shoulder shrug is normal. The tongue has normal motion without fasciculations.   Coordination:    No dysmetria or ataxia   Gait:    Slightly stooped, walks with a cane, narrow gait, also has the appearance of the knees to be closer together while the ankles remain more distantly apart   Motor Observation:    No asymmetry, no atrophy, and no involuntary movements noted. Tone:    Normal muscle tone.      Strength:    Strength is 4-4+ in the upper and lower limbs, symmetrical, no focal weakness     Sensation: intact to LT     Reflex Exam:  DTR's:    Absent AJs. Deep tendon reflexes in the upper and lower extremities are symmetrical bilaterally.   Toes:    The toes are downgoing bilaterally.   Clonus:    Clonus is absent.    Assessment/Plan:   85 y.o. female here as requested by Marrian Salvage,* for vertigo. PMHx hypothyroidism, HTN, Erysipelas. She is here alone, lovely and funny lady stating she has a "funky head" referring to vertigo and dizziness. Orthostatics negative today in the office(130/70 supine p71, sitting 137/76 69, standing 137/72 78, standing 3 mins 151/80 76).  Sounds like BPPV. She has had it before and resolved with vestibular therapy next door at neuro rehab 15 years ago, will refer her there again. But also order MRI brain to make sure no strokes, CN  VIII pathology or tumors affecting the nerve, space occupying mass or other.   Orders Placed This Encounter  Procedures  . MR BRAIN W WO CONTRAST  . Basic Metabolic Panel  . Ambulatory referral to Physical Therapy   No orders of the defined types were placed in this encounter.   Cc: Marrian Salvage,*,  Biagio Borg, MD  Sarina Ill, MD  [facility] [address] [place], [place] [address]  Phone [phone] Fax [phone]

## 2020-05-12 NOTE — Patient Instructions (Signed)
MRi of the brain Vestibular therapy   Benign Positional Vertigo Vertigo is the feeling that you or your surroundings are moving when they are not. Benign positional vertigo is the most common form of vertigo. This is usually a harmless condition (benign). This condition is positional. This means that symptoms are triggered by certain movements and positions. This condition can be dangerous if it occurs while you are doing something that could cause harm to you or others. This includes activities such as driving or operating machinery. What are the causes? The inner ear has fluid-filled canals that help your brain sense movement and balance. When the fluid moves, the brain receives messages about your body's position. With benign positional vertigo, crystals in the inner ear break free and disturb the inner ear area. This causes your brain to receive confusing messages about your body's position. What increases the risk? You are more likely to develop this condition if:  You are a woman.  You are 12 years of age or older.  You have recently had a head injury.  You have an inner ear disease. What are the signs or symptoms? Symptoms of this condition usually happen when you move your head or your eyes in different directions. Symptoms may start suddenly, and usually last for less than a minute. They include:  Loss of balance and falling.  Feeling like you are spinning or moving.  Feeling like your surroundings are spinning or moving.  Nausea and vomiting.  Blurred vision.  Dizziness.  Involuntary eye movement (nystagmus). Symptoms can be mild and cause only minor problems, or they can be severe and interfere with daily life. Episodes of benign positional vertigo may return (recur) over time. Symptoms may improve over time. How is this diagnosed? This condition may be diagnosed based on:  Your medical history.  Physical exam of the head, neck, and ears.  Positional tests to  check for or stimulate vertigo. You may be asked to turn your head and change positions, such as going from sitting to lying down. A health care provider will watch for symptoms of vertigo. You may be referred to a health care provider who specializes in ear, nose, and throat problems (ENT, or otolaryngologist) or a provider who specializes in disorders of the nervous system (neurologist). How is this treated? This condition may be treated in a session in which your health care provider moves your head in specific positions to help the displaced crystals in your inner ear move. Treatment for this condition may take several sessions. Surgery may be needed in severe cases, but this is rare. In some cases, benign positional vertigo may resolve on its own in 2-4 weeks.   Follow these instructions at home: Safety  Move slowly. Avoid sudden body or head movements or certain positions, as told by your health care provider.  Avoid driving until your health care provider says it is safe for you to do so.  Avoid operating heavy machinery until your health care provider says it is safe for you to do so.  Avoid doing any tasks that would be dangerous to you or others if vertigo occurs.  If you have trouble walking or keeping your balance, try using a cane for stability. If you feel dizzy or unstable, sit down right away.  Return to your normal activities as told by your health care provider. Ask your health care provider what activities are safe for you. General instructions  Take over-the-counter and prescription medicines only as told by  your health care provider.  Drink enough fluid to keep your urine pale yellow.  Keep all follow-up visits as told by your health care provider. This is important. Contact a health care provider if:  You have a fever.  Your condition gets worse or you develop new symptoms.  Your family or friends notice any behavioral changes.  You have nausea or vomiting that  gets worse.  You have numbness or a prickling and tingling sensation. Get help right away if you:  Have difficulty speaking or moving.  Are always dizzy.  Faint.  Develop severe headaches.  Have weakness in your legs or arms.  Have changes in your hearing or vision.  Develop a stiff neck.  Develop sensitivity to light. Summary  Vertigo is the feeling that you or your surroundings are moving when they are not. Benign positional vertigo is the most common form of vertigo.  This condition is caused by crystals in the inner ear that become displaced. This causes a disturbance in an area of the inner ear that helps your brain sense movement and balance.  Symptoms include loss of balance and falling, feeling that you or your surroundings are moving, nausea and vomiting, and blurred vision.  This condition can be diagnosed based on symptoms, a physical exam, and positional tests.  Follow safety instructions as told by your health care provider. You will also be told when to contact your health care provider in case of problems. This information is not intended to replace advice given to you by your health care provider. Make sure you discuss any questions you have with your health care provider. Document Revised: 02/26/2019 Document Reviewed: 09/13/2017 Elsevier Patient Education  2021 [employer].  Vertigo Vertigo is the feeling that you or your surroundings are moving when they are not. This feeling can come and go at any time. Vertigo often goes away on its own. Vertigo can be dangerous if it occurs while you are doing something that could endanger you or others, such as driving or operating machinery. Your health care provider will do tests to determine the cause of your vertigo. Tests will also help your health care provider decide how best to treat your condition. Follow these instructions at home: Eating and drinking  Drink enough fluid to keep your urine pale yellow.  Do  not drink alcohol.      Activity  Return to your normal activities as told by your health care provider. Ask your health care provider what activities are safe for you.  In the morning, first sit up on the side of the bed. When you feel okay, stand slowly while you hold onto something until you know that your balance is fine.  Move slowly. Avoid sudden body or head movements or certain positions, as told by your health care provider.  If you have trouble walking or keeping your balance, try using a cane for stability. If you feel dizzy or unstable, sit down right away.  Avoid doing any tasks that would cause danger to you or others if vertigo occurs.  Avoid bending down if you feel dizzy. Place items in your home so that they are easy for you to reach without leaning over.  Do not drive or use heavy machinery if you feel dizzy. General instructions  Take over-the-counter and prescription medicines only as told by your health care provider.  Keep all follow-up visits as told by your health care provider. This is important. Contact a health care provider if:  Your medicines do not relieve your vertigo or they make it worse.  You have a fever.  Your condition gets worse or you develop new symptoms.  Your family or friends notice any behavioral changes.  Your nausea or vomiting gets worse.  You have numbness or a prickling and tingling sensation in part of your body. Get help right away if you:  Have difficulty moving or speaking.  Are always dizzy.  Faint.  Develop severe headaches.  Have weakness in your hands, arms, or legs.  Have changes in your hearing or vision.  Develop a stiff neck.  Develop sensitivity to light. Summary  Vertigo is the feeling that you or your surroundings are moving when they are not.  Your health care provider will do tests to determine the cause of your vertigo.  Follow instructions for home care. You may be told to avoid certain  tasks, positions, or movements.  Contact a health care provider if your medicines do not relieve your symptoms, or if you have a fever, nausea, vomiting, or changes in behavior.  Get help right away if you have severe headaches or difficulty speaking, or you develop hearing or vision problems. This information is not intended to replace advice given to you by your health care provider. Make sure you discuss any questions you have with your health care provider. Document Revised: 02/26/2018 Document Reviewed: 02/26/2018 Elsevier Patient Education  2021 [employer].

## 2020-05-13 LAB — BASIC METABOLIC PANEL
BUN/Creatinine Ratio: 17 (ref 12–28)
BUN: 18 mg/dL (ref 8–27)
CO2: 28 mmol/L (ref 20–29)
Calcium: 9.7 mg/dL (ref 8.7–10.3)
Chloride: 98 mmol/L (ref 96–106)
Creatinine, Ser: 1.03 mg/dL — ABNORMAL HIGH (ref 0.57–1.00)
GFR calc Af Amer: 58 mL/min/{1.73_m2} — ABNORMAL LOW (ref >59)
GFR calc non Af Amer: 51 mL/min/{1.73_m2} — ABNORMAL LOW (ref >59)
Glucose: 95 mg/dL (ref 65–99)
Potassium: 4.5 mmol/L (ref 3.5–5.2)
Sodium: 140 mmol/L (ref 134–144)

## 2020-05-18 ENCOUNTER — Other Ambulatory Visit: Payer: Self-pay

## 2020-05-18 ENCOUNTER — Ambulatory Visit: Payer: Medicare Other | Attending: Neurology | Admitting: Physical Therapy

## 2020-05-18 DIAGNOSIS — R2681 Unsteadiness on feet: Secondary | ICD-10-CM | POA: Insufficient documentation

## 2020-05-18 DIAGNOSIS — R42 Dizziness and giddiness: Secondary | ICD-10-CM | POA: Diagnosis not present

## 2020-05-18 DIAGNOSIS — R262 Difficulty in walking, not elsewhere classified: Secondary | ICD-10-CM | POA: Insufficient documentation

## 2020-05-18 NOTE — Therapy (Signed)
[facility] [address] [place], [place], [address] Phone: [phone]   Fax:  [phone]  Physical Therapy Evaluation  Patient Details  Name: Julia Norton MRN: [id number] Date of Birth: 1935-08-28 Referring Provider (PT): Melvenia Beam, MD   Encounter Date: 05/18/2020   PT End of Session - 05/18/20 2021    Visit Number 1    Number of Visits 13    Date for PT Re-Evaluation 07/02/20    Authorization Type [employer]-Medicare; 10th visit PN    Progress Note Due on Visit 10    PT Start Time 1406    PT Stop Time 1450    PT Time Calculation (min) 44 min    Activity Tolerance Patient tolerated treatment well    Behavior During Therapy [facility] for tasks assessed/performed           Past Medical History:  Diagnosis Date  . Allergic rhinitis   . Asthma   . Breast cancer ([place]) 1987  . Erysipelas   . Hemorrhoids   . Hypertension   . Hypothyroidism     Past Surgical History:  Procedure Laterality Date  . BREAST LUMPECTOMY Right 1986   malignant  . TONSILLECTOMY      There were no vitals filed for this visit.    Subjective Assessment - 05/18/20 1455    Subjective "Feels more like there is fluid in my head."  Is scheduled for MRI.  May 9th 2021 was first episode and was leaning forwards and down to show husband some photos and when she stood back up she felt spinning and legs felt like "jelly"; body started to feel really weak.  Called 911 but they did not think she was having a CVA or MI; had elevated BP.  Full work up by PCP without any significant findings.  Feeling in her head has remained since that episode.  Started to use cane full time after that episode.    Pertinent History Asthma, Breast CA, Erysipelas, HTN, hypothyroidism    Diagnostic tests Has MRI coming up    Patient Stated Goals To get rid of the dizziness; "I don't want to feel anything in my head!"    Currently in Pain? No/denies               [facility] PT Assessment - 05/18/20 1500      Assessment   Medical Diagnosis Vertigo    Referring Provider (PT) Melvenia Beam, MD    Onset Date/Surgical Date 08/25/19    Prior Therapy yes for vertigo x 15 years ago      Precautions   Precautions Other (comment)    Precaution Comments Asthma, Breast CA, Erysipelas, HTN, hypothyroidism, L torn meniscus.      Balance Screen   Has the patient fallen in the past 6 months No      [place] residence    Living Arrangements Spouse/significant other    Type of [place] to enter    Entrance Stairs-Number of Steps 2 + landing    Entrance Stairs-Rails Left    Home Layout Two level;Able to live on main level with bedroom/bathroom    Additional Comments Uses cane in the house; doesn't go upstairs much but when she does she descends backwards due to knee      Prior Function   Level of Independence Independent with basic ADLs;Independent with household mobility with device;Independent with community mobility  with device;Independent with homemaking with ambulation;Independent with transfers    Leisure Still driving      Observation/Other Assessments   Focus on Therapeutic Outcomes (FOTO)  Dizziness Positional Status: 43; Dizziness Functional Status: 47.6      Sensation   Light Touch Appears Intact      Posture/Postural Control   Posture/Postural Control Postural limitations    Postural Limitations Forward head;Rounded Shoulders;Increased thoracic kyphosis;Posterior pelvic tilt;Flexed trunk      ROM / Strength   AROM / PROM / Strength Strength      Strength   Overall Strength Within functional limits for tasks performed                  Vestibular Assessment - 05/18/20 1505      Vestibular Assessment   General Observation Lost vision in R eye, has partially returned, blurry in R eye      Symptom Behavior   Subjective history of current problem Denies  changes in hearing, glasses prescription has changed, denies diplopia, denies tinnitus, denies HA or neck pain.    Type of Dizziness  Spinning;"Funny feeling in head"    Frequency of Dizziness Constant    Duration of Dizziness constant    Symptom Nature Constant    Aggravating Factors Turning head quickly;Forward bending;Mornings;Supine to sit;Turning body quickly;Sitting with head tilted back    Relieving Factors No known relieving factors    Progression of Symptoms No change since onset    History of similar episodes Severe vertigo 15 years ago      Oculomotor Exam   Oculomotor Alignment Normal    Spontaneous Absent    Gaze-induced  Absent    Smooth Pursuits Intact    Saccades Slow      Oculomotor Exam-Fixation Suppressed    Left Head Impulse negative    Right Head Impulse difficult to assess due to pt assisting and easily startled, closed eyes      Vestibulo-Ocular Reflex   VOR to Slow Head Movement Normal    VOR Cancellation Normal   slight dizziness     Visual Acuity   Static 8    Dynamic 7      Positional Testing   Dix-Hallpike Dix-Hallpike Right;Dix-Hallpike Left    Horizontal Canal Testing Horizontal Canal Right;Horizontal Canal Left      Dix-Hallpike Right   Dix-Hallpike Right Duration 1 second    Dix-Hallpike Right Symptoms No nystagmus      Dix-Hallpike Left   Dix-Hallpike Left Duration 3 seconds    Dix-Hallpike Left Symptoms No nystagmus      Horizontal Canal Right   Horizontal Canal Right Duration 0    Horizontal Canal Right Symptoms Normal      Horizontal Canal Left   Horizontal Canal Left Duration 0    Horizontal Canal Left Symptoms Normal      Positional Sensitivities   Sit to Supine No dizziness    Supine to Left Side No dizziness    Supine to Right Side No dizziness    Supine to Sitting Mild dizziness    Right Hallpike Lightheadedness    Up from Right Hallpike Mild dizziness    Up from Left Hallpike Mild dizziness    Nose to Right Knee No  dizziness    Right Knee to Sitting No dizziness    Nose to Left Knee No dizziness    Left Knee to Sitting Mild dizziness    Head Turning x 5 Lightheadedness    Head Nodding x 5  No dizziness    Pivot Right in Standing No dizziness    Pivot Left in Standing Mild dizziness    Rolling Right No dizziness    Rolling Left No dizziness              Objective measurements completed on examination: See above findings.               PT Education - 05/18/20 2021    Education Details clinical findings, PT POC and goals    Person(s) Educated Patient    Methods Explanation    Comprehension Verbalized understanding            PT Short Term Goals - 05/18/20 2031      PT SHORT TERM GOAL #1   Title Pt will participate in further assessment of falls risk with DGI    Time 3    Period Weeks    Status New    Target Date 06/08/20      PT SHORT TERM GOAL #2   Title Pt will initiate HEP focusing on balance, habituation and posture    Time 3    Period Weeks    Status New    Target Date 06/08/20             PT Long Term Goals - 05/18/20 2032      PT LONG TERM GOAL #1   Title Pt will report improvement in DPS to >/= 57 and increase in DFS by 5 points    Baseline DPS: 43; DFS: 47.6    Time 6    Period Weeks    Status New    Target Date 07/02/20      PT LONG TERM GOAL #2   Title Pt will demonstrate independence with final vestibular, balance and postural HEP    Time 6    Period Weeks    Status New    Target Date 07/02/20      PT LONG TERM GOAL #3   Title Pt will demonstrate 4 point improvement in DGI due to less dependence on cane    Baseline TBD    Time 6    Period Weeks    Status New    Target Date 07/02/20      PT LONG TERM GOAL #4   Title Pt will report no dizziness with supine > sit, leaning head back at beauty salon, leaning down to the ground or with quick head/body turns    Time 6    Period Weeks    Status New    Target Date 07/02/20                   Plan - 05/18/20 2022    Clinical Impression Statement Pt is an 85 year old female referred to Neuro OPPT for evaluation of persistent dizziness.  Pt's PMH is significant for the following: Asthma, Breast CA, Erysipelas, HTN, hypothyroidism, L torn meniscus. The following deficits were noted during pt's exam: Disequilibrium, impaired posture, motion sensitivity, impaired standing balance, and difficulty with walking.  Pt was negative for positional vertigo and HIT was negative bilaterally; DVA was within one line difference.  Pt would benefit from skilled PT to address these impairments and functional limitations to maximize functional mobility independence and reduce falls risk.    Personal Factors and Comorbidities Comorbidity 3+;Age;Time since onset of injury/illness/exacerbation    Comorbidities Asthma, Breast CA, Erysipelas, HTN, hypothyroidism, L torn meniscus    Examination-Activity Limitations Bed Mobility;Bend;Locomotion Tree surgeon  Examination-Participation Restrictions Cleaning;Laundry;Meal Prep    Stability/Clinical Decision Making Evolving/Moderate complexity    Clinical Decision Making Moderate    Rehab Potential Good    PT Frequency 2x / week    PT Duration 6 weeks    PT Treatment/Interventions ADLs/Self Care Home Management;Canalith Repostioning;Cryotherapy;Moist Heat;DME Instruction;Gait training;Functional mobility training;Therapeutic activities;Therapeutic exercise;Balance training;Neuromuscular re-education;Manual techniques;Patient/family education;Passive range of motion;Dry needling;Vestibular    PT Next Visit Plan Did pt get results of MRI?  Assess if there is any cervical involvement in dizziness?  Assess DGI (pt descends stairs backwards because of knee).  Initiate HEP for habituation and balance.                            HIT was negative/positional testing was negative - possibly motion sensitivity vs. cervicogenic (but pt doesn't have neck  pain)??    Consulted and Agree with Plan of Care Patient           Patient will benefit from skilled therapeutic intervention in order to improve the following deficits and impairments:  Difficulty walking,Dizziness,Decreased balance,Postural dysfunction  Visit Diagnosis: Dizziness and giddiness  Unsteadiness on feet  Difficulty in walking, not elsewhere classified     Problem List Patient Active Problem List   Diagnosis Date Noted  . Benign paroxysmal positional vertigo 05/12/2020  . Orthostatic hypotension 09/23/2019  . Dizziness 09/17/2019  . OAB (overactive bladder) 08/27/2019  . Pain of left thumb 08/27/2019  . Family history of factor V Leiden mutation 08/11/2019  . Peripheral edema 08/11/2019  . Preventative health care 08/08/2019  . Hypothyroidism   . Hypertension   . Asthma   . Allergic rhinitis   . hx: right breast cancer 11/25/2011    Rico Junker, PT, DPT 05/18/20    8:38 PM    [facility] [address] [place], [place], [address] Phone: [phone]   Fax:  [phone]  Name: Julia Norton MRN: [id number] Date of Birth: 1935/05/13

## 2020-05-24 ENCOUNTER — Other Ambulatory Visit: Payer: Self-pay

## 2020-05-24 ENCOUNTER — Ambulatory Visit
Admission: RE | Admit: 2020-05-24 | Discharge: 2020-05-24 | Disposition: A | Discharge status: Home / Self Care | Payer: Medicare Other | Source: Ambulatory Visit | Attending: Neurology | Admitting: Neurology

## 2020-05-24 DIAGNOSIS — R2689 Other abnormalities of gait and mobility: Secondary | ICD-10-CM

## 2020-05-24 DIAGNOSIS — R42 Dizziness and giddiness: Secondary | ICD-10-CM

## 2020-05-24 MED ORDER — GADOBENATE DIMEGLUMINE 529 MG/ML IV SOLN
17.0000 mL | Freq: Once | INTRAVENOUS | Status: AC | PRN
  Administered 2020-05-24: 17 mL via INTRAVENOUS

## 2020-05-26 ENCOUNTER — Other Ambulatory Visit: Payer: Self-pay

## 2020-05-26 ENCOUNTER — Ambulatory Visit: Payer: Medicare Other | Attending: Neurology

## 2020-05-26 DIAGNOSIS — R2681 Unsteadiness on feet: Secondary | ICD-10-CM | POA: Insufficient documentation

## 2020-05-26 DIAGNOSIS — R262 Difficulty in walking, not elsewhere classified: Secondary | ICD-10-CM | POA: Insufficient documentation

## 2020-05-26 DIAGNOSIS — R42 Dizziness and giddiness: Secondary | ICD-10-CM | POA: Diagnosis not present

## 2020-05-26 NOTE — Therapy (Signed)
[facility] [address] [place], [place], [address] Phone: [phone]   Fax:  [phone]  Physical Therapy Treatment  Patient Details  Name: DELPHINE SIZEMORE MRN: [id number] Date of Birth: 06-14-1935 Referring Provider (PT): Melvenia Beam, MD   Encounter Date: 05/26/2020   PT End of Session - 05/26/20 1447    Visit Number 2    Number of Visits 13    Date for PT Re-Evaluation 07/02/20    Authorization Type UHC-Medicare; 10th visit PN    Progress Note Due on Visit 10    PT Start Time 3151    PT Stop Time 1530    PT Time Calculation (min) 43 min    Activity Tolerance Patient tolerated treatment well    Behavior During Therapy [facility] for tasks assessed/performed           Past Medical History:  Diagnosis Date  . Allergic rhinitis   . Asthma   . Breast cancer ([place]) 1987  . Erysipelas   . Hemorrhoids   . Hypertension   . Hypothyroidism     Past Surgical History:  Procedure Laterality Date  . BREAST LUMPECTOMY Right 1986   malignant  . TONSILLECTOMY      There were no vitals filed for this visit.   Subjective Assessment - 05/26/20 1449    Subjective Patient reports that MRI looked normal for age, no concerning findings. Reports that dizziness has been about the same. Still continues to have sensation of fluid in the head. No falls to report.    Pertinent History Asthma, Breast CA, Erysipelas, HTN, hypothyroidism    Diagnostic tests Has MRI coming up    Patient Stated Goals To get rid of the dizziness; "I don't want to feel anything in my head!"    Currently in Pain? No/denies              [facility] PT Assessment - 05/26/20 0001      ROM / Strength   AROM / PROM / Strength AROM      AROM   Overall AROM  Deficits    AROM Assessment Site Cervical    Cervical Flexion 49    Cervical Extension 48    Cervical - Right Rotation 45   pain on R side of neck; toward base of skull   Cervical - Left  Rotation 48   pain on L side of neck toward base of skull              Vestibular Assessment - 05/26/20 0001      Auditory   Comments Assessed Cervical torsion test: No abnormal findings. No increase in dizziness with completion of test.            [facility] Adult PT Treatment/Exercise - 05/26/20 0001      Ambulation/Gait   Ambulation/Gait Yes    Ambulation/Gait Assistance 5: Supervision    Ambulation/Gait Assistance Details ambulating into/out of therapy session with [facility]    Assistive device Straight cane    Ambulation Surface Level;Indoor      Therapeutic Activites    Therapeutic Activities Other Therapeutic Activities    Other Therapeutic Activities Completed habituation of nose <> knee, completed 2 x 5 reps each direction. intermittent rest break required between completion due to increased dizziness. PT educating on proper completion for HEP.           Vestibular Treatment/Exercise - 05/26/20 0001      Vestibular Treatment/Exercise   Habituation  Exercises Standing Horizontal Head Turns;Standing Vertical Head Turns      Standing Horizontal Head Turns   Number of Reps  5    Symptom Description  x 2 sets; increased symptoms (very mild). does report pain at end range in neck      Standing Vertical Head Turns   Number of Reps  5    Symptom Description  x 2 sets; increased symptoms (very mild)          Established initial HEP focused on the following exercises:  Access Code: [email or web address] URL: [email or web address] Date: 05/26/2020 Prepared by: Baldomero Lamy  Exercises Seated Nose to Knee Vestibular Habituation - 1 x daily - 5 x weekly - 2 sets - 5 reps Vestibular Balance Standing with Head Turns - 1 x daily - 5 x weekly - 2 sets - 10 reps Vestibular Balance Standing with Head Nod - 1 x daily - 5 x weekly - 2 sets - 10 reps        PT Education - 05/26/20 1532    Education Details educated on initial HEP    Person(s) Educated Patient    Methods  Explanation;Demonstration;Handout    Comprehension Verbalized understanding;Returned demonstration            PT Short Term Goals - 05/18/20 2031      PT SHORT TERM GOAL #1   Title Pt will participate in further assessment of falls risk with DGI    Time 3    Period Weeks    Status New    Target Date 06/08/20      PT SHORT TERM GOAL #2   Title Pt will initiate HEP focusing on balance, habituation and posture    Time 3    Period Weeks    Status New    Target Date 06/08/20             PT Long Term Goals - 05/18/20 2032      PT LONG TERM GOAL #1   Title Pt will report improvement in DPS to >/= 57 and increase in DFS by 5 points    Baseline DPS: 43; DFS: 47.6    Time 6    Period Weeks    Status New    Target Date 07/02/20      PT LONG TERM GOAL #2   Title Pt will demonstrate independence with final vestibular, balance and postural HEP    Time 6    Period Weeks    Status New    Target Date 07/02/20      PT LONG TERM GOAL #3   Title Pt will demonstrate 4 point improvement in DGI due to less dependence on cane    Baseline TBD    Time 6    Period Weeks    Status New    Target Date 07/02/20      PT LONG TERM GOAL #4   Title Pt will report no dizziness with supine > sit, leaning head back at beauty salon, leaning down to the ground or with quick head/body turns    Time 6    Period Weeks    Status New    Target Date 07/02/20                 Plan - 05/26/20 1449    Clinical Impression Statement Today's skilled session focused on further assessment of cervical involvement, upon assesment decreased cervical ROM noted and increased pain noted at end range. However cervical neck torsion test  for cervicogenic dizziness was negative today. Rest of session spent establishing initial habituation/balance HEP with patient tolerating well. Will continue to progress toward all LTGs.    Personal Factors and Comorbidities Comorbidity 3+;Age;Time since onset of  injury/illness/exacerbation    Comorbidities Asthma, Breast CA, Erysipelas, HTN, hypothyroidism, L torn meniscus    Examination-Activity Limitations Bed Mobility;Bend;Locomotion Level;Reach Overhead    Examination-Participation Restrictions Cleaning;Laundry;Meal Prep    Stability/Clinical Decision Making Evolving/Moderate complexity    Rehab Potential Good    PT Frequency 2x / week    PT Duration 6 weeks    PT Treatment/Interventions ADLs/Self Care Home Management;Canalith Repostioning;Cryotherapy;Moist Heat;DME Instruction;Gait training;Functional mobility training;Therapeutic activities;Therapeutic exercise;Balance training;Neuromuscular re-education;Manual techniques;Patient/family education;Passive range of motion;Dry needling;Vestibular    PT Next Visit Plan MRI results was normal. Assess DGI (pt descends stairs backwards because of knee). Initiate HEP for habituation and balance.  HIT was negative/positional testing was negative - possibly motion sensitivity vs. cervicogenic (but pt doesn't have neck pain)??    Consulted and Agree with Plan of Care Patient           Patient will benefit from skilled therapeutic intervention in order to improve the following deficits and impairments:  Difficulty walking,Dizziness,Decreased balance,Postural dysfunction  Visit Diagnosis: Dizziness and giddiness  Unsteadiness on feet  Difficulty in walking, not elsewhere classified     Problem List Patient Active Problem List   Diagnosis Date Noted  . Benign paroxysmal positional vertigo 05/12/2020  . Orthostatic hypotension 09/23/2019  . Dizziness 09/17/2019  . OAB (overactive bladder) 08/27/2019  . Pain of left thumb 08/27/2019  . Family history of factor V Leiden mutation 08/11/2019  . Peripheral edema 08/11/2019  . Preventative health care 08/08/2019  . Hypothyroidism   . Hypertension   . Asthma   . Allergic rhinitis   . hx: right breast cancer 11/25/2011    Jones Bales, PT,  DPT 05/26/2020, 6:38 PM  [facility] [address] [place], [place], [address] Phone: [phone]   Fax:  [phone]  Name: DAVIELLE LINGELBACH MRN: [id number] Date of Birth: 11-May-1935

## 2020-05-26 NOTE — Patient Instructions (Addendum)
  Access Code: [email or web address] URL: [email or web address] Date: 05/26/2020 Prepared by: Baldomero Lamy  Exercises Seated Nose to Knee Vestibular Habituation - 1 x daily - 5 x weekly - 2 sets - 5 reps Vestibular Balance Standing with Head Turns - 1 x daily - 5 x weekly - 2 sets - 10 reps Vestibular Balance Standing with Head Nod - 1 x daily - 5 x weekly - 2 sets - 10 reps

## 2020-05-29 ENCOUNTER — Ambulatory Visit: Payer: Medicare Other | Admitting: Physical Therapy

## 2020-05-29 ENCOUNTER — Other Ambulatory Visit: Payer: Self-pay

## 2020-05-29 ENCOUNTER — Encounter: Payer: Self-pay | Admitting: Physical Therapy

## 2020-05-29 ENCOUNTER — Telehealth: Payer: Self-pay

## 2020-05-29 DIAGNOSIS — R42 Dizziness and giddiness: Secondary | ICD-10-CM

## 2020-05-29 DIAGNOSIS — R262 Difficulty in walking, not elsewhere classified: Secondary | ICD-10-CM

## 2020-05-29 DIAGNOSIS — R2681 Unsteadiness on feet: Secondary | ICD-10-CM

## 2020-05-29 MED ORDER — LEVOTHYROXINE SODIUM 100 MCG PO TABS: 100.0000 ug | ORAL_TABLET | Freq: Every day | ORAL | 3 refills | Status: DC

## 2020-05-29 NOTE — Telephone Encounter (Signed)
Ok , I sent the rx as per pt request .....again

## 2020-05-29 NOTE — Addendum Note (Signed)
Addended by: Biagio Borg on: 05/29/2020 03:38 PM   Modules accepted: Orders

## 2020-05-29 NOTE — Patient Instructions (Signed)
Access Code: [email or web address] URL: [email or web address] Date: 05/29/2020 Prepared by: Willow Ora  Exercises Seated Nose to Knee Vestibular Habituation - 1 x daily - 5 x weekly - 2 sets - 5 reps Vestibular Balance Standing with Head Turns - 1 x daily - 5 x weekly - 2 sets - 10 reps Vestibular Balance Standing with Head Nod - 1 x daily - 5 x weekly - 2 sets - 10 reps  Added these today Standing Balance in Corner with Eyes Closed - 1 x daily - 5 x weekly - 1 sets - 3 reps - 30 hold Wide Tandem Stance with Eyes Closed - 1 x daily - 5 x weekly - 1 sets - 3 reps - 20 hold

## 2020-05-29 NOTE — Telephone Encounter (Signed)
Pt notified of new orders being sent to pharmacy

## 2020-05-29 NOTE — Telephone Encounter (Signed)
Patient states we will have to write that we need brand name with generic price as well as call them and let them know it has to be brand name with generic price.   [phone]

## 2020-05-29 NOTE — Telephone Encounter (Signed)
Patient is having this issue again and is wondering if we can send it in again. She needs it sent in with the code so she can get it for the generic price.

## 2020-05-31 NOTE — Therapy (Signed)
[facility] [address] [place], [place], [address] Phone: [phone]   Fax:  [phone]  Physical Therapy Treatment  Patient Details  Name: Julia Norton MRN: [id number] Date of Birth: [birth year] Referring Provider (PT): Melvenia Beam, MD   Encounter Date: 05/29/2020   05/29/20 1106  PT Visits / Re-Eval  Visit Number 3  Number of Visits 13  Date for PT Re-Evaluation 07/02/20  Authorization  Authorization Type [employer]-Medicare; 10th visit PN  Progress Note Due on Visit 10  PT Time Calculation  PT Start Time 1104  PT Stop Time 1145  PT Time Calculation (min) 41 min  PT - End of Session  Equipment Utilized During Treatment Gait belt  Activity Tolerance Patient tolerated treatment well  Behavior During Therapy [facility] for tasks assessed/performed      Past Medical History:  Diagnosis Date  . Allergic rhinitis   . Asthma   . Breast cancer ([place]) 1987  . Erysipelas   . Hemorrhoids   . Hypertension   . Hypothyroidism     Past Surgical History:  Procedure Laterality Date  . BREAST LUMPECTOMY Right 1986   malignant  . TONSILLECTOMY      There were no vitals filed for this visit.     05/29/20 1106  Symptoms/Limitations  Subjective No new complaitns. Reports feeling 'a tad better". Rating dizziness at a 4/10 currently.  Pertinent History Asthma, Breast CA, Erysipelas, HTN, hypothyroidism  Diagnostic tests Has MRI coming up  Patient Stated Goals To get rid of the dizziness; "I don't want to feel anything in my head!"  Pain Assessment  Currently in Pain? No/denies      05/29/20 1110  Standardized Balance Assessment  Standardized Balance Assessment Dynamic Gait Index  Dynamic Gait Index  Level Surface 2 (antalgic due to right hip pain)  Change in Gait Speed 3  Gait with Horizontal Head Turns 2  Gait with Vertical Head Turns 2  Gait and Pivot Turn 2  Step Over Obstacle 2  Step Around Obstacles  3  Steps 2  Total Score 18       05/29/20 1120  Transfers  Transfers Sit to Stand;Stand to Sit  Sit to Stand 6: Modified independent (Device/Increase time)  Stand to Sit 6: Modified independent (Device/Increase time)  Ambulation/Gait  Ambulation/Gait Yes  Ambulation/Gait Assistance 5: Supervision;4: Min guard  Ambulation/Gait Assistance Details around gym with session  Assistive device Straight cane;None  Gait Pattern Step-through pattern;Decreased stride length;Antalgic;Trunk flexed;Narrow base of support  Ambulation Surface Level;Indoor  Neuro Re-ed   Neuro Re-ed Details  Reviewed ex's issued last session. Pt with hip pain on right side with nose to knee motion, discussed ways to decreased this pain including not going all the way down. Added additional ex's to HEP. Refer to [place] for full details. Min guard assist for safety with cues on form and technique.    Issued the following to HEP this session:   Access Code: [email or web address] URL: [email or web address] Date: 05/29/2020 Prepared by: Willow Ora  Exercises Reviewed these today: Seated Nose to Knee Vestibular Habituation - 1 x daily - 5 x weekly - 2 sets - 5 reps Vestibular Balance Standing with Head Turns - 1 x daily - 5 x weekly - 2 sets - 10 reps Vestibular Balance Standing with Head Nod - 1 x daily - 5 x weekly - 2 sets - 10 reps  Added these today Standing Balance in Corner with Eyes Closed - 1 x daily -  5 x weekly - 1 sets - 3 reps - 30 hold Wide Tandem Stance with Eyes Closed - 1 x daily - 5 x weekly - 1 sets - 3 reps - 20 hold      05/29/20 1138  PT Education  Education Details results of DGI and additions to HEP  Person(s) Educated Patient  Methods Explanation;Demonstration;Verbal cues;Handout  Comprehension Verbalized understanding;Returned demonstration;Verbal cues required;Need further instruction          PT Short Term Goals - 05/18/20 2031      PT SHORT TERM GOAL #1   Title Pt  will participate in further assessment of falls risk with DGI    Time 3    Period Weeks    Status New    Target Date 06/08/20      PT SHORT TERM GOAL #2   Title Pt will initiate HEP focusing on balance, habituation and posture    Time 3    Period Weeks    Status New    Target Date 06/08/20             PT Long Term Goals - 05/18/20 2032      PT LONG TERM GOAL #1   Title Pt will report improvement in DPS to >/= 57 and increase in DFS by 5 points    Baseline DPS: 43; DFS: 47.6    Time 6    Period Weeks    Status New    Target Date 07/02/20      PT LONG TERM GOAL #2   Title Pt will demonstrate independence with final vestibular, balance and postural HEP    Time 6    Period Weeks    Status New    Target Date 07/02/20      PT LONG TERM GOAL #3   Title Pt will demonstrate 4 point improvement in DGI due to less dependence on cane    Baseline TBD    Time 6    Period Weeks    Status New    Target Date 07/02/20      PT LONG TERM GOAL #4   Title Pt will report no dizziness with supine > sit, leaning head back at beauty salon, leaning down to the ground or with quick head/body turns    Time 6    Period Weeks    Status New    Target Date 07/02/20             05/29/20 1107  Plan  Clinical Impression Statement Today's skilled session initially focused on setting the baseline score dynamic gait index for baseline score with pt scoring in fall risk category with 18/24. Remainder of session focused on adding additional ex's to HEP to address balance with no significant issues noted or reported. The pt is progressing toward goals and should benefit from continued PT to progress toward unmet goals.  Personal Factors and Comorbidities Comorbidity 3+;Age;Time since onset of injury/illness/exacerbation  Comorbidities Asthma, Breast CA, Erysipelas, HTN, hypothyroidism, L torn meniscus  Examination-Activity Limitations Bed Mobility;Bend;Locomotion Level;Reach Overhead   Examination-Participation Restrictions Cleaning;Laundry;Meal Prep  Pt will benefit from skilled therapeutic intervention in order to improve on the following deficits Difficulty walking;Dizziness;Decreased balance;Postural dysfunction  Stability/Clinical Decision Making Evolving/Moderate complexity  Rehab Potential Good  PT Frequency 2x / week  PT Duration 6 weeks  PT Treatment/Interventions ADLs/Self Care Home Management;Canalith Repostioning;Cryotherapy;Moist Heat;DME Instruction;Gait training;Functional mobility training;Therapeutic activities;Therapeutic exercise;Balance training;Neuromuscular re-education;Manual techniques;Patient/family education;Passive range of motion;Dry needling;Vestibular  PT Next Visit Plan continue to  work on balance ex's with increased vestibular imput. HIT was negative/positional testing negative- possibly motion sensitivity vs cervicogenic (however pt does not have neck pain)??  PT Home Exercise Plan Access Code: [email or web address]  Consulted and Agree with Plan of Care Patient          Patient will benefit from skilled therapeutic intervention in order to improve the following deficits and impairments:  Difficulty walking,Dizziness,Decreased balance,Postural dysfunction  Visit Diagnosis: Dizziness and giddiness  Unsteadiness on feet  Difficulty in walking, not elsewhere classified     Problem List Patient Active Problem List   Diagnosis Date Noted  . Benign paroxysmal positional vertigo 05/12/2020  . Orthostatic hypotension 09/23/2019  . Dizziness 09/17/2019  . OAB (overactive bladder) 08/27/2019  . Pain of left thumb 08/27/2019  . Family history of factor V Leiden mutation 08/11/2019  . Peripheral edema 08/11/2019  . Preventative health care 08/08/2019  . Hypothyroidism   . Hypertension   . Asthma   . Allergic rhinitis   . hx: right breast cancer 11/25/2011    Willow Ora, PTA, [facility] [address], [place], [place] [address] [phone] 05/31/20, 7:58 PM   Name: Julia Norton MRN: [id number] Date of Birth: 14-Nov-1935

## 2020-06-01 ENCOUNTER — Ambulatory Visit: Payer: Medicare Other | Admitting: Physical Therapy

## 2020-06-01 ENCOUNTER — Encounter: Payer: Self-pay | Admitting: Physical Therapy

## 2020-06-01 ENCOUNTER — Other Ambulatory Visit: Payer: Self-pay

## 2020-06-01 DIAGNOSIS — R42 Dizziness and giddiness: Secondary | ICD-10-CM | POA: Diagnosis not present

## 2020-06-01 DIAGNOSIS — R2681 Unsteadiness on feet: Secondary | ICD-10-CM

## 2020-06-01 DIAGNOSIS — R262 Difficulty in walking, not elsewhere classified: Secondary | ICD-10-CM

## 2020-06-01 NOTE — Patient Instructions (Addendum)
Access Code: [email or web address] URL: [email or web address] Date: 06/01/2020 Prepared by: Misty Stanley  Exercises Seated Nose to Knee Vestibular Habituation - 1 x daily - 5 x weekly - 2 sets - 5 reps Wide Tandem Stance with Eyes Closed - 1 x daily - 5 x weekly - 1 sets - 3 reps - 20 hold Wide Stance with Head Rotation on Foam Pad - 1 x daily - 7 x weekly - 1 sets - 10 reps Wide Stance with Head Nods on Foam Pad - 1 x daily - 7 x weekly - 1 sets - 10 reps Wide Stance with Eyes Closed and Head Rotation - 1 x daily - 7 x weekly - 2 sets - 5 reps Wide Stance with Eyes Closed and Head Nods - 1 x daily - 7 x weekly - 2 sets - 5 reps

## 2020-06-01 NOTE — Therapy (Signed)
[facility] [address] [place], [place], [address] Phone: [phone]   Fax:  [phone]  Physical Therapy Treatment  Patient Details  Name: Julia Norton MRN: [id number] Date of Birth: 1935/08/01 Referring Provider (PT): Melvenia Beam, MD   Encounter Date: 06/01/2020   PT End of Session - 06/01/20 1658    Visit Number 4    Number of Visits 13    Date for PT Re-Evaluation 07/02/20    Authorization Type UHC-Medicare; 10th visit PN    Progress Note Due on Visit 10    PT Start Time 1320    PT Stop Time 1400    PT Time Calculation (min) 40 min    Activity Tolerance Patient tolerated treatment well    Behavior During Therapy [facility] for tasks assessed/performed           Past Medical History:  Diagnosis Date  . Allergic rhinitis   . Asthma   . Breast cancer ([place]) 1987  . Erysipelas   . Hemorrhoids   . Hypertension   . Hypothyroidism     Past Surgical History:  Procedure Laterality Date  . BREAST LUMPECTOMY Right 1986   malignant  . TONSILLECTOMY      There were no vitals filed for this visit.   Subjective Assessment - 06/01/20 1323    Subjective "I'm getting better."   Has been walking around the house at faster speeds and finds herself walking without the cane.  Asking about HEP and sequencing.    Pertinent History Asthma, Breast CA, Erysipelas, HTN, hypothyroidism    Diagnostic tests Has MRI coming up    Patient Stated Goals To get rid of the dizziness; "I don't want to feel anything in my head!"    Currently in Pain? No/denies                              Vestibular Treatment/Exercise - 06/01/20 1343      Vestibular Treatment/Exercise   Vestibular Treatment Provided Habituation    Habituation Exercises Seated Vertical Head Turns;Standing Horizontal Head Turns;Standing Vertical Head Turns;Comment      Seated Vertical Head Turns   Number of Reps  5    Symptom  Description  bending over R then L knee with a hold progressing to repeated bending over R and L knee with functional activity of picking up object from floor and placing on other side repeating 5 times to each side, 2 sets with mild symptoms      Standing Horizontal Head Turns   Number of Reps  5    Symptom Description  x3 sets progressing to feet together on solid surface and then to feet apart on compliant      Standing Vertical Head Turns   Number of Reps  5    Symptom Description  feet apart on compliant surface              Balance Exercises - 06/01/20 1401      Balance Exercises: Standing   Standing Eyes Opened Narrow base of support (BOS);Wide (BOA);Foam/compliant surface;Head turns;Solid surface;5 reps    Standing Eyes Closed Narrow base of support (BOS);Wide (BOA);Head turns;Solid surface;5 reps;20 secs    Tandem Stance Eyes closed;2 reps;20 secs   wide tandem stance, L and R            PT Education - 06/01/20 1403    Education Details updated HEP  Person(s) Educated Patient    Methods Explanation;Demonstration;Handout    Comprehension Verbalized understanding;Returned demonstration           Access Code: [email or web address] URL: [email or web address] Date: 06/01/2020 Prepared by: Misty Stanley  Exercises Seated Nose to Knee Vestibular Habituation - 1 x daily - 5 x weekly - 2 sets - 5 reps Wide Tandem Stance with Eyes Closed - 1 x daily - 5 x weekly - 1 sets - 3 reps - 20 hold Wide Stance with Head Rotation on Foam Pad - 1 x daily - 7 x weekly - 1 sets - 10 reps Wide Stance with Head Nods on Foam Pad - 1 x daily - 7 x weekly - 1 sets - 10 reps Wide Stance with Eyes Closed and Head Rotation - 1 x daily - 7 x weekly - 2 sets - 5 reps Wide Stance with Eyes Closed and Head Nods - 1 x daily - 7 x weekly - 2 sets - 5 reps      PT Short Term Goals - 06/01/20 1701      PT SHORT TERM GOAL #1   Title Pt will participate in further assessment of falls risk  with DGI    Status Achieved      PT SHORT TERM GOAL #2   Title Pt will initiate HEP focusing on balance, habituation and posture    Status Achieved             PT Long Term Goals - 06/01/20 1701      PT LONG TERM GOAL #1   Title Pt will report improvement in DPS to >/= 57 and increase in DFS by 5 points    Baseline DPS: 43; DFS: 47.6    Time 6    Period Weeks    Status New    Target Date 07/02/20      PT LONG TERM GOAL #2   Title Pt will demonstrate independence with final vestibular, balance and postural HEP    Time 6    Period Weeks    Status New    Target Date 07/02/20      PT LONG TERM GOAL #3   Title Pt will demonstrate 4 point improvement in DGI due to less dependence on cane    Baseline 18/24    Time 6    Period Weeks    Status Revised    Target Date 07/02/20      PT LONG TERM GOAL #4   Title Pt will report no dizziness with supine > sit, leaning head back at beauty salon, leaning down to the ground or with quick head/body turns    Time 6    Period Weeks    Status New    Target Date 07/02/20                 Plan - 06/01/20 1658    Clinical Impression Statement STG have been met.  Due to progress treatment session focused on upgrading current HEP.  Progressed speed of bending over R and L knee and added picking up items from floor for more functional carryover.  For habituation to head turns and nods added compliant surface.  Added head turns to eyes closed with wide stance but no change to wide tandem stance.  Symptoms remained mild but did have greater sway on copmliant surface.  Will continue to address in order to progress towards LTG.    Personal Factors and Comorbidities Comorbidity 3+;Age;Time since onset of injury/illness/exacerbation  Comorbidities Asthma, Breast CA, Erysipelas, HTN, hypothyroidism, L torn meniscus    Examination-Activity Limitations Bed Mobility;Bend;Locomotion Level;Reach Overhead    Examination-Participation Restrictions  Cleaning;Laundry;Meal Prep    Stability/Clinical Decision Making Evolving/Moderate complexity    Rehab Potential Good    PT Frequency 2x / week    PT Duration 6 weeks    PT Treatment/Interventions ADLs/Self Care Home Management;Canalith Repostioning;Cryotherapy;Moist Heat;DME Instruction;Gait training;Functional mobility training;Therapeutic activities;Therapeutic exercise;Balance training;Neuromuscular re-education;Manual techniques;Patient/family education;Passive range of motion;Dry needling;Vestibular    PT Next Visit Plan Continue to progress habituation and balance exercises; balance training on compliant surface.  Progress bending down to the ground to standing if possible.    PT Home Exercise Plan Access Code: [email or web address]    Consulted and Agree with Plan of Care Patient           Patient will benefit from skilled therapeutic intervention in order to improve the following deficits and impairments:  Difficulty walking,Dizziness,Decreased balance,Postural dysfunction  Visit Diagnosis: Dizziness and giddiness  Unsteadiness on feet  Difficulty in walking, not elsewhere classified     Problem List Patient Active Problem List   Diagnosis Date Noted  . Benign paroxysmal positional vertigo 05/12/2020  . Orthostatic hypotension 09/23/2019  . Dizziness 09/17/2019  . OAB (overactive bladder) 08/27/2019  . Pain of left thumb 08/27/2019  . Family history of factor V Leiden mutation 08/11/2019  . Peripheral edema 08/11/2019  . Preventative health care 08/08/2019  . Hypothyroidism   . Hypertension   . Asthma   . Allergic rhinitis   . hx: right breast cancer 11/25/2011   Rico Junker, PT, DPT 06/01/20    5:04 PM    [facility] [address] [place], [place], [address] Phone: [phone]   Fax:  [phone]  Name: Julia Norton MRN: [id number] Date of Birth: 1935/10/08

## 2020-06-04 ENCOUNTER — Ambulatory Visit: Payer: Medicare Other

## 2020-06-04 ENCOUNTER — Other Ambulatory Visit: Payer: Self-pay

## 2020-06-04 DIAGNOSIS — R42 Dizziness and giddiness: Secondary | ICD-10-CM | POA: Diagnosis not present

## 2020-06-04 DIAGNOSIS — R2681 Unsteadiness on feet: Secondary | ICD-10-CM

## 2020-06-04 DIAGNOSIS — R262 Difficulty in walking, not elsewhere classified: Secondary | ICD-10-CM

## 2020-06-04 NOTE — Therapy (Signed)
[facility] [address] [place], [place], [address] Phone: [phone]   Fax:  [phone]  Physical Therapy Treatment  Patient Details  Name: Julia Norton MRN: [id number] Date of Birth: [birth year] Referring Provider (PT): Melvenia Beam, MD   Encounter Date: 06/04/2020   PT End of Session - 06/04/20 1102    Visit Number 5    Number of Visits 13    Date for PT Re-Evaluation 07/02/20    Authorization Type UHC-Medicare; 10th visit PN    Progress Note Due on Visit 10    PT Start Time 1102    PT Stop Time 1144    PT Time Calculation (min) 42 min    Activity Tolerance Patient tolerated treatment well    Behavior During Therapy [facility] for tasks assessed/performed           Past Medical History:  Diagnosis Date  . Allergic rhinitis   . Asthma   . Breast cancer ([place]) 1987  . Erysipelas   . Hemorrhoids   . Hypertension   . Hypothyroidism     Past Surgical History:  Procedure Laterality Date  . BREAST LUMPECTOMY Right 1986   malignant  . TONSILLECTOMY      There were no vitals filed for this visit.   Subjective Assessment - 06/04/20 1105    Subjective Patient reports feels as she is continous getting better. Reports that changes to exercises are going well. No falls    Pertinent History Asthma, Breast CA, Erysipelas, HTN, hypothyroidism    Diagnostic tests Has MRI coming up    Patient Stated Goals To get rid of the dizziness; "I don't want to feel anything in my head!"    Currently in Pain? No/denies               [facility] Adult PT Treatment/Exercise - 06/04/20 0001      Ambulation/Gait   Ambulation/Gait Yes    Ambulation/Gait Assistance 5: Supervision;4: Min guard    Ambulation/Gait Assistance Details around therapy gym without AD    Assistive device None    Gait Pattern Step-through pattern;Decreased stride length;Antalgic;Trunk flexed;Narrow base of support    Ambulation Surface  Level;Indoor      High Level Balance   High Level Balance Activities Head turns    High Level Balance Comments Completed ambulation with horizontal head turns in hallway without AD, completed 5 x 30' with CGA from PT. Increased balance challenge with patient intermittent touching wall and increase in dizziness (5/10). Seated rest break required to allow for resolution of dizziness.      Therapeutic Activites    Therapeutic Activities Other Therapeutic Activities    Other Therapeutic Activities Completed standing habituation working toward bending down to cone on ground and then back to standing, completed x 5 reps bilaterally. Mild increase in dizziness, 3/10. Progressed to completing x 10 reps. No imbalance noted, just mild dizziness.               Balance Exercises - 06/04/20 0001      Balance Exercises: Standing   Standing Eyes Opened Wide (BOA);Foam/compliant surface;Head turns;Solid surface;Limitations    Standing Eyes Opened Limitations standing on airex completed horizontal/vertical head turns 2 x 10 reps. mild pain in neck, PT educaitng to complete in pain free range.    Standing Eyes Closed Narrow base of support (BOS);Foam/compliant surface;3 reps;30 secs;Limitations    Standing Eyes Closed Limitations completed standing with narrow BOS (knees touching), completed eyes closed 3  x 30 seconds. increased sway noted with narrow BOS.    Tandem Stance Eyes open;Intermittent upper extremity support;3 reps;Limitations    Tandem Stance Time completed tandem stance, 2 x 30 seconds with intermittent UE uspport on firm surface. Progressed to completed with partial tandem and horizontal/vertical head turns 1 x 10 reps, alternating foot position between completion.               PT Short Term Goals - 06/01/20 1701      PT SHORT TERM GOAL #1   Title Pt will participate in further assessment of falls risk with DGI    Status Achieved      PT SHORT TERM GOAL #2   Title Pt will  initiate HEP focusing on balance, habituation and posture    Status Achieved             PT Long Term Goals - 06/01/20 1701      PT LONG TERM GOAL #1   Title Pt will report improvement in DPS to >/= 57 and increase in DFS by 5 points    Baseline DPS: 43; DFS: 47.6    Time 6    Period Weeks    Status New    Target Date 07/02/20      PT LONG TERM GOAL #2   Title Pt will demonstrate independence with final vestibular, balance and postural HEP    Time 6    Period Weeks    Status New    Target Date 07/02/20      PT LONG TERM GOAL #3   Title Pt will demonstrate 4 point improvement in DGI due to less dependence on cane    Baseline 18/24    Time 6    Period Weeks    Status Revised    Target Date 07/02/20      PT LONG TERM GOAL #4   Title Pt will report no dizziness with supine > sit, leaning head back at beauty salon, leaning down to the ground or with quick head/body turns    Time 6    Period Weeks    Status New    Target Date 07/02/20                 Plan - 06/04/20 1144    Clinical Impression Statement Continued progression of habituation/balance exercises. Patient tolerating standing bending over activity well, with mild increase in dizziness. Contineud to progress balance actvities on complaint surfaces. With ambulation with head turns, increased dizziness and balance challenge noted requiring CGA from PT. Will continue to progress toward all LTGs.    Personal Factors and Comorbidities Comorbidity 3+;Age;Time since onset of injury/illness/exacerbation    Comorbidities Asthma, Breast CA, Erysipelas, HTN, hypothyroidism, L torn meniscus    Examination-Activity Limitations Bed Mobility;Bend;Locomotion Level;Reach Overhead    Examination-Participation Restrictions Cleaning;Laundry;Meal Prep    Stability/Clinical Decision Making Evolving/Moderate complexity    Rehab Potential Good    PT Frequency 2x / week    PT Duration 6 weeks    PT Treatment/Interventions  ADLs/Self Care Home Management;Canalith Repostioning;Cryotherapy;Moist Heat;DME Instruction;Gait training;Functional mobility training;Therapeutic activities;Therapeutic exercise;Balance training;Neuromuscular re-education;Manual techniques;Patient/family education;Passive range of motion;Dry needling;Vestibular    PT Next Visit Plan Continue to progress habituation and balance exercises; balance training on compliant surface.  Progress bending down to the ground to standing if possible.    PT Home Exercise Plan Access Code: [email or web address]    Consulted and Agree with Plan of Care Patient  Patient will benefit from skilled therapeutic intervention in order to improve the following deficits and impairments:  Difficulty walking,Dizziness,Decreased balance,Postural dysfunction  Visit Diagnosis: Dizziness and giddiness  Unsteadiness on feet  Difficulty in walking, not elsewhere classified     Problem List Patient Active Problem List   Diagnosis Date Noted  . Benign paroxysmal positional vertigo 05/12/2020  . Orthostatic hypotension 09/23/2019  . Dizziness 09/17/2019  . OAB (overactive bladder) 08/27/2019  . Pain of left thumb 08/27/2019  . Family history of factor V Leiden mutation 08/11/2019  . Peripheral edema 08/11/2019  . Preventative health care 08/08/2019  . Hypothyroidism   . Hypertension   . Asthma   . Allergic rhinitis   . hx: right breast cancer 11/25/2011    Jones Bales, PT, DPT 06/04/2020, 11:55 AM  [facility] [address] [place], [place], [address] Phone: [phone]   Fax:  [phone]  Name: Julia Norton MRN: [id number] Date of Birth: 1936-01-10

## 2020-06-08 ENCOUNTER — Other Ambulatory Visit: Payer: Self-pay

## 2020-06-08 ENCOUNTER — Ambulatory Visit: Payer: Medicare Other

## 2020-06-08 DIAGNOSIS — R42 Dizziness and giddiness: Secondary | ICD-10-CM | POA: Diagnosis not present

## 2020-06-08 DIAGNOSIS — R2681 Unsteadiness on feet: Secondary | ICD-10-CM

## 2020-06-08 DIAGNOSIS — R262 Difficulty in walking, not elsewhere classified: Secondary | ICD-10-CM

## 2020-06-08 NOTE — Therapy (Signed)
[facility] [address] [place], [place], [address] Phone: [phone]   Fax:  [phone]  Physical Therapy Treatment  Patient Details  Name: Julia Norton MRN: [id number] Date of Birth: 07/10/1935 Referring Provider (PT): Melvenia Beam, MD   Encounter Date: 06/08/2020   PT End of Session - 06/08/20 1233    Visit Number 6    Number of Visits 13    Date for PT Re-Evaluation 07/02/20    Authorization Type [employer]-Medicare; 10th visit PN    Progress Note Due on Visit 10    PT Start Time 1232    PT Stop Time 1315    PT Time Calculation (min) 43 min    Equipment Utilized During Treatment Gait belt    Activity Tolerance Patient tolerated treatment well    Behavior During Therapy [facility] for tasks assessed/performed           Past Medical History:  Diagnosis Date  . Allergic rhinitis   . Asthma   . Breast cancer ([place]) 1987  . Erysipelas   . Hemorrhoids   . Hypertension   . Hypothyroidism     Past Surgical History:  Procedure Laterality Date  . BREAST LUMPECTOMY Right 1986   malignant  . TONSILLECTOMY      There were no vitals filed for this visit.   Subjective Assessment - 06/08/20 1235    Subjective No falls to report. Patient reports did have one episode of feeling funny headed feeling on friday/saturday. No other changes.    Pertinent History Asthma, Breast CA, Erysipelas, HTN, hypothyroidism    Diagnostic tests Has MRI coming up    Patient Stated Goals To get rid of the dizziness; "I don't want to feel anything in my head!"    Currently in Pain? Yes    Pain Score 5     Pain Location Neck    Pain Orientation Posterior    Pain Descriptors / Indicators Aching    Pain Type Chronic pain    Pain Onset More than a month ago    Pain Frequency Intermittent    Aggravating Factors  head movements               [facility] Adult PT Treatment/Exercise - 06/08/20 0001      Ambulation/Gait    Ambulation/Gait Yes    Ambulation/Gait Assistance 5: Supervision    Ambulation/Gait Assistance Details throughout therapy gym with activities    Assistive device None    Gait Pattern Step-through pattern;Decreased stride length;Antalgic;Trunk flexed;Narrow base of support    Ambulation Surface Level;Indoor      Self-Care   Self-Care Other Self-Care Comments    Other Self-Care Comments  Due to pain in neck, PT educating on safe application of heat to painful area to reduce stiffness/alleviate pain. PT educating on application time, and ensure to have layers between heat to avoid burning skin. Patient verbalized understanding.      Therapeutic Activites    Therapeutic Activities Other Therapeutic Activities    Other Therapeutic Activities Completed standing habituation working toward bending down to cone on ground and then back to standing, completed x 8 reps bilaterally. Mild increase in dizziness, 2/10. Progressed to completing x 10 reps on complaint surface (blue mat). Mild increase in dizziness 3-4/10, but overall tolerating well on complaint surface.            Balance Exercises - 06/08/20 0001      Balance Exercises: Standing   Standing Eyes Closed  Narrow base of support (BOS);Wide (BOA);Head turns;Foam/compliant surface;3 reps;30 secs;Limitations    Standing Eyes Closed Limitations completed standing with narrow BOS (knees touching), completed eyes closed 3 x 30 seconds. increased sway noted with narrow BOS. Completed wide BOS and horizontal/vertical head turns with vision removed, completed 2 x 10 reps. mild dizziness reported, resolved with rest break.    Gait with Head Turns Forward;Intermittent upper extremity support;Limitations    Gait with Head Turns Limitations at countertop completed gait with head turns and light UE support. Completed x 5 laps down and back. Educated on completion at home as addition to HEP.             PT Education - 06/08/20 1314    Education Details  HEP Update; See Self Care    Person(s) Educated Patient    Methods Explanation;Handout;Demonstration    Comprehension Verbalized understanding;Returned demonstration           Access Code: [email or web address] URL: [email or web address] Date: 06/08/2020 Prepared by: Baldomero Lamy  Exercises Seated Nose to Knee Vestibular Habituation - 1 x daily - 5 x weekly - 2 sets - 5 reps Wide Tandem Stance with Eyes Closed - 1 x daily - 5 x weekly - 1 sets - 3 reps - 20 hold Wide Stance with Head Rotation on Foam Pad - 1 x daily - 7 x weekly - 1 sets - 10 reps Wide Stance with Head Nods on Foam Pad - 1 x daily - 7 x weekly - 1 sets - 10 reps Wide Stance with Eyes Closed and Head Rotation - 1 x daily - 7 x weekly - 2 sets - 5 reps Wide Stance with Eyes Closed and Head Nods - 1 x daily - 7 x weekly - 2 sets - 5 reps Walking with Head Rotation - 1 x daily - 7 x weekly - 1 sets - 5 reps    PT Short Term Goals - 06/01/20 1701      PT SHORT TERM GOAL #1   Title Pt will participate in further assessment of falls risk with DGI    Status Achieved      PT SHORT TERM GOAL #2   Title Pt will initiate HEP focusing on balance, habituation and posture    Status Achieved             PT Long Term Goals - 06/01/20 1701      PT LONG TERM GOAL #1   Title Pt will report improvement in DPS to >/= 57 and increase in DFS by 5 points    Baseline DPS: 43; DFS: 47.6    Time 6    Period Weeks    Status New    Target Date 07/02/20      PT LONG TERM GOAL #2   Title Pt will demonstrate independence with final vestibular, balance and postural HEP    Time 6    Period Weeks    Status New    Target Date 07/02/20      PT LONG TERM GOAL #3   Title Pt will demonstrate 4 point improvement in DGI due to less dependence on cane    Baseline 18/24    Time 6    Period Weeks    Status Revised    Target Date 07/02/20      PT LONG TERM GOAL #4   Title Pt will report no dizziness with supine > sit, leaning  head back at beauty salon, leaning down to the ground or  with quick head/body turns    Time 6    Period Weeks    Status New    Target Date 07/02/20                 Plan - 06/08/20 1320    Clinical Impression Statement Continued habituation activites today during session, patient tolerating progression of standing bending over activty on complaint surface, and continued to work on gait with head turns. intermittent rest breaks reuqired. Patient still reporting mild pain in neck with horizontal head turns. Will continue to progress toward all LTGs.    Personal Factors and Comorbidities Comorbidity 3+;Age;Time since onset of injury/illness/exacerbation    Comorbidities Asthma, Breast CA, Erysipelas, HTN, hypothyroidism, L torn meniscus    Examination-Activity Limitations Bed Mobility;Bend;Locomotion Level;Reach Overhead    Examination-Participation Restrictions Cleaning;Laundry;Meal Prep    Stability/Clinical Decision Making Evolving/Moderate complexity    Rehab Potential Good    PT Frequency 2x / week    PT Duration 6 weeks    PT Treatment/Interventions ADLs/Self Care Home Management;Canalith Repostioning;Cryotherapy;Moist Heat;DME Instruction;Gait training;Functional mobility training;Therapeutic activities;Therapeutic exercise;Balance training;Neuromuscular re-education;Manual techniques;Patient/family education;Passive range of motion;Dry needling;Vestibular    PT Next Visit Plan Continue to progress habituation and balance exercises; balance training on compliant surface. Head Turns with Stroop Task?    PT Home Exercise Plan Access Code: [email or web address]    Consulted and Agree with Plan of Care Patient           Patient will benefit from skilled therapeutic intervention in order to improve the following deficits and impairments:  Difficulty walking,Dizziness,Decreased balance,Postural dysfunction  Visit Diagnosis: Dizziness and giddiness  Unsteadiness on feet  Difficulty in  walking, not elsewhere classified     Problem List Patient Active Problem List   Diagnosis Date Noted  . Benign paroxysmal positional vertigo 05/12/2020  . Orthostatic hypotension 09/23/2019  . Dizziness 09/17/2019  . OAB (overactive bladder) 08/27/2019  . Pain of left thumb 08/27/2019  . Family history of factor V Leiden mutation 08/11/2019  . Peripheral edema 08/11/2019  . Preventative health care 08/08/2019  . Hypothyroidism   . Hypertension   . Asthma   . Allergic rhinitis   . hx: right breast cancer 11/25/2011    Jones Bales, PT, DPT 06/08/2020, 1:24 PM  [facility] [address] [place], [place], [address] Phone: [phone]   Fax:  [phone]  Name: Julia Norton MRN: [id number] Date of Birth: [birth year]

## 2020-06-08 NOTE — Patient Instructions (Addendum)
Access Code: [email or web address] URL: [email or web address] Date: 06/08/2020 Prepared by: Baldomero Lamy  Exercises Seated Nose to Knee Vestibular Habituation - 1 x daily - 5 x weekly - 2 sets - 5 reps - progressed to completing standing at home. No exercise on medbridge. Wide Tandem Stance with Eyes Closed - 1 x daily - 5 x weekly - 1 sets - 3 reps - 20 hold Wide Stance with Head Rotation on Foam Pad - 1 x daily - 7 x weekly - 1 sets - 10 reps Wide Stance with Head Nods on Foam Pad - 1 x daily - 7 x weekly - 1 sets - 10 reps Wide Stance with Eyes Closed and Head Rotation - 1 x daily - 7 x weekly - 2 sets - 5 reps Wide Stance with Eyes Closed and Head Nods - 1 x daily - 7 x weekly - 2 sets - 5 reps Walking with Head Rotation - 1 x daily - 7 x weekly - 1 sets - 5 reps

## 2020-06-11 ENCOUNTER — Ambulatory Visit: Payer: Medicare Other

## 2020-06-11 ENCOUNTER — Other Ambulatory Visit: Payer: Self-pay

## 2020-06-11 DIAGNOSIS — R42 Dizziness and giddiness: Secondary | ICD-10-CM | POA: Diagnosis not present

## 2020-06-11 DIAGNOSIS — R2681 Unsteadiness on feet: Secondary | ICD-10-CM

## 2020-06-11 DIAGNOSIS — R262 Difficulty in walking, not elsewhere classified: Secondary | ICD-10-CM

## 2020-06-11 NOTE — Patient Instructions (Signed)
Access Code: [email or web address] URL: [email or web address] Date: 06/11/2020 Prepared by: Baldomero Lamy  Exercises Seated Nose to Knee Vestibular Habituation - 1 x daily - 5 x weekly - 2 sets - 5 reps Wide Tandem Stance with Eyes Closed - 1 x daily - 5 x weekly - 1 sets - 3 reps - 20 hold Wide Stance with Head Rotation on Foam Pad - 1 x daily - 7 x weekly - 1 sets - 10 reps Wide Stance with Head Nods on Foam Pad - 1 x daily - 7 x weekly - 1 sets - 10 reps Walking with Head Rotation - 1 x daily - 7 x weekly - 1 sets - 5 reps Wide Stance with Eyes Closed and Head Nods on Foam Pad - 1 x daily - 7 x weekly - 2 sets - 10 reps Wide Stance with Eyes Closed and Head Rotation on Foam Pad - 1 x daily - 7 x weekly - 2 sets - 10 reps

## 2020-06-11 NOTE — Therapy (Signed)
[facility] [address] [place], [place], [address] Phone: [phone]   Fax:  [phone]  Physical Therapy Treatment  Patient Details  Name: Julia Norton MRN: [id number] Date of Birth: February 22, 1936 Referring Provider (PT): Melvenia Beam, MD   Encounter Date: 06/11/2020   PT End of Session - 06/11/20 1151    Visit Number 7    Number of Visits 13    Date for PT Re-Evaluation 07/02/20    Authorization Type [employer]-Medicare; 10th visit PN    Progress Note Due on Visit 10    PT Start Time 1147    PT Stop Time 1230    PT Time Calculation (min) 43 min    Equipment Utilized During Treatment Gait belt    Activity Tolerance Patient tolerated treatment well    Behavior During Therapy [facility] for tasks assessed/performed           Past Medical History:  Diagnosis Date  . Allergic rhinitis   . Asthma   . Breast cancer ([place]) 1987  . Erysipelas   . Hemorrhoids   . Hypertension   . Hypothyroidism     Past Surgical History:  Procedure Laterality Date  . BREAST LUMPECTOMY Right 1986   malignant  . TONSILLECTOMY      There were no vitals filed for this visit.   Subjective Assessment - 06/11/20 1149    Subjective Patient reports no new changes/complaints. The knee was bothering her yesterday so did not do the bending over. Patient reports that using heat on the neck helped.    Pertinent History Asthma, Breast CA, Erysipelas, HTN, hypothyroidism    Diagnostic tests Has MRI coming up    Patient Stated Goals To get rid of the dizziness; "I don't want to feel anything in my head!"    Currently in Pain? No/denies    Pain Onset More than a month ago              [facility] Adult PT Treatment/Exercise - 06/11/20 0001      Ambulation/Gait   Ambulation/Gait Yes    Ambulation/Gait Assistance 5: Supervision    Ambulation/Gait Assistance Details throughout therapy gym with activities    Assistive device None    Gait  Pattern Step-through pattern;Decreased stride length;Antalgic;Trunk flexed;Narrow base of support    Ambulation Surface Level;Indoor      High Level Balance   High Level Balance Activities Head turns    High Level Balance Comments Completed ambulation with horizontal/vertical head turns at countertop with intermittent UE support completed x 4 laps down and back each. Increased challenge with horizontal > vertical.           Vestibular Treatment/Exercise - 06/11/20 0001      Vestibular Treatment/Exercise   Vestibular Treatment Provided Habituation    Habituation Exercises 180 degree Turns;Standing Horizontal Head Turns;360 degree Turns      Standing Horizontal Head Turns   Number of Reps  4    Symptom Description  Completed x 4 sets with addition of number/stroop card. First two sets completed on firm surface, then progressed to last two sets on airex pad. increased balance challenge on balance pad noted.      180 degree Turns   Number of Reps  6    Symptom Description  At countertop completed 180 deg x 6 reps, no dizziness reported.      360 degree Turns   Number of Reps  4    Symptom Description  At  countertop completed 360 deg x 4 reps, patient reports mild dizziness with completion, intermittent touch A from countertop            Balance Exercises - 06/11/20 0001      Balance Exercises: Standing   Standing Eyes Closed Wide (BOA);Head turns;Foam/compliant surface;3 reps;30 secs;Limitations    Standing Eyes Closed Limitations completed standing on airex with wide BOS completed 3 x 30 seconds; then progressed to completing eyes closed iwth horizontal/vertical head turns 2 x 10 reps. Updated HEP to included eyes closed and head turns on foam.    Stepping Strategy Anterior;Foam/compliant surface;Limitations    Stepping Strategy Limitations on blue balance beam completed anterior stepping strategy x 10 reps with intermittent UE support. one instance of catching toe requiring CGA  and UE support to maintain balance.    Rockerboard Anterior/posterior;Lateral;Head turns;EO;Intermittent UE support;Limitations    Rockerboard Limitations standing on rockerboard positioned A/P completed standing without UE support x 1 minute followed by vertical head turns x 10 reps. Then completed A/P weight shift x 15 reps initially with UE support then progressed to no UE support verbal cues for improved technique. Then progressed to board positioned laterally completed standing without UE support x 1 minute then progressed to completed horizontal head turns x 10 reps. Then completed lateral weight shift x 15 reps with intermittent UE support.             PT Education - 06/11/20 1229    Education Details HEP Update    Person(s) Educated Patient    Methods Explanation;Handout    Comprehension Verbalized understanding            PT Short Term Goals - 06/01/20 1701      PT SHORT TERM GOAL #1   Title Pt will participate in further assessment of falls risk with DGI    Status Achieved      PT SHORT TERM GOAL #2   Title Pt will initiate HEP focusing on balance, habituation and posture    Status Achieved             PT Long Term Goals - 06/01/20 1701      PT LONG TERM GOAL #1   Title Pt will report improvement in DPS to >/= 57 and increase in DFS by 5 points    Baseline DPS: 43; DFS: 47.6    Time 6    Period Weeks    Status New    Target Date 07/02/20      PT LONG TERM GOAL #2   Title Pt will demonstrate independence with final vestibular, balance and postural HEP    Time 6    Period Weeks    Status New    Target Date 07/02/20      PT LONG TERM GOAL #3   Title Pt will demonstrate 4 point improvement in DGI due to less dependence on cane    Baseline 18/24    Time 6    Period Weeks    Status Revised    Target Date 07/02/20      PT LONG TERM GOAL #4   Title Pt will report no dizziness with supine > sit, leaning head back at beauty salon, leaning down to the ground  or with quick head/body turns    Time 6    Period Weeks    Status New    Target Date 07/02/20                 Plan -  06/11/20 1329    Clinical Impression Statement Continued habituation activities today during session, working toward horizontal/vertical head turns with addition of cognitive task. Patient tolerating well with no dizziness. Continued high level balance activities including horizontal head turns with gait as well as working toward improved stepping strategies. Patient did have mild dizziness with 360 deg turns.  Will continue to progress toward all LTGs.    Personal Factors and Comorbidities Comorbidity 3+;Age;Time since onset of injury/illness/exacerbation    Comorbidities Asthma, Breast CA, Erysipelas, HTN, hypothyroidism, L torn meniscus    Examination-Activity Limitations Bed Mobility;Bend;Locomotion Level;Reach Overhead    Examination-Participation Restrictions Cleaning;Laundry;Meal Prep    Stability/Clinical Decision Making Evolving/Moderate complexity    Rehab Potential Good    PT Frequency 2x / week    PT Duration 6 weeks    PT Treatment/Interventions ADLs/Self Care Home Management;Canalith Repostioning;Cryotherapy;Moist Heat;DME Instruction;Gait training;Functional mobility training;Therapeutic activities;Therapeutic exercise;Balance training;Neuromuscular re-education;Manual techniques;Patient/family education;Passive range of motion;Dry needling;Vestibular    PT Next Visit Plan Continue to progress habituation and balance exercises; balance training on compliant surface.    PT Home Exercise Plan Access Code: [email or web address]    Consulted and Agree with Plan of Care Patient           Patient will benefit from skilled therapeutic intervention in order to improve the following deficits and impairments:  Difficulty walking,Dizziness,Decreased balance,Postural dysfunction  Visit Diagnosis: Dizziness and giddiness  Unsteadiness on feet  Difficulty in walking, not  elsewhere classified     Problem List Patient Active Problem List   Diagnosis Date Noted  . Benign paroxysmal positional vertigo 05/12/2020  . Orthostatic hypotension 09/23/2019  . Dizziness 09/17/2019  . OAB (overactive bladder) 08/27/2019  . Pain of left thumb 08/27/2019  . Family history of factor V Leiden mutation 08/11/2019  . Peripheral edema 08/11/2019  . Preventative health care 08/08/2019  . Hypothyroidism   . Hypertension   . Asthma   . Allergic rhinitis   . hx: right breast cancer 11/25/2011    Jones Bales, PT, DPT 06/11/2020, 1:32 PM  [facility] [address] [place], [place], [address] Phone: [phone]   Fax:  [phone]  Name: SHERRISE LIBERTO MRN: [id number] Date of Birth: 06/22/1935

## 2020-06-15 ENCOUNTER — Ambulatory Visit: Payer: Medicare Other

## 2020-06-15 ENCOUNTER — Other Ambulatory Visit: Payer: Self-pay

## 2020-06-15 DIAGNOSIS — R42 Dizziness and giddiness: Secondary | ICD-10-CM

## 2020-06-15 DIAGNOSIS — R262 Difficulty in walking, not elsewhere classified: Secondary | ICD-10-CM

## 2020-06-15 DIAGNOSIS — R2681 Unsteadiness on feet: Secondary | ICD-10-CM

## 2020-06-15 NOTE — Therapy (Signed)
[facility] [address] [place], [place], [address] Phone: [phone]   Fax:  [phone]  Physical Therapy Treatment  Patient Details  Name: Julia Norton MRN: [id number] Date of Birth: [birth year] Referring Provider (PT): Melvenia Beam, MD   Encounter Date: 06/15/2020   PT End of Session - 06/15/20 1321    Visit Number 8    Number of Visits 13    Date for PT Re-Evaluation 07/02/20    Authorization Type U[employer]-Medicare; 10th visit PN    Progress Note Due on Visit 10    PT Start Time 1316    PT Stop Time 1400    PT Time Calculation (min) 44 min    Equipment Utilized During Treatment Gait belt    Activity Tolerance Patient tolerated treatment well    Behavior During Therapy [facility] for tasks assessed/performed           Past Medical History:  Diagnosis Date  . Allergic rhinitis   . Asthma   . Breast cancer ([place]) 1987  . Erysipelas   . Hemorrhoids   . Hypertension   . Hypothyroidism     Past Surgical History:  Procedure Laterality Date  . BREAST LUMPECTOMY Right 1986   malignant  . TONSILLECTOMY      There were no vitals filed for this visit.   Subjective Assessment - 06/15/20 1319    Subjective No falls to report. Patient reports feeling a little fullness in the headness and woozy feeling yesterday and is feeling it today.    Pertinent History Asthma, Breast CA, Erysipelas, HTN, hypothyroidism    Diagnostic tests Has MRI coming up    Patient Stated Goals To get rid of the dizziness; "I don't want to feel anything in my head!"    Currently in Pain? Yes    Pain Score 4     Pain Location Knee    Pain Orientation Left    Pain Descriptors / Indicators Aching    Pain Type Chronic pain              [facility] Adult PT Treatment/Exercise - 06/15/20 0001      Ambulation/Gait   Ambulation/Gait Yes    Ambulation/Gait Assistance 5: Supervision    Ambulation/Gait Assistance Details througout therapy  session with activities, mild imbalance noted intermittently with gait    Assistive device None    Gait Pattern Step-through pattern;Decreased stride length;Antalgic;Trunk flexed;Narrow base of support    Ambulation Surface Level;Indoor      High Level Balance   High Level Balance Activities Negotiating over obstacles    High Level Balance Comments At countertop: completed negotiation/stepping over orange hurdles compelted step to pattern initially x 3 laps down and back wihtout UE support. then progressed to completing alternating step pattern with light UE support x 3 laps down and back.      Therapeutic Activites    Therapeutic Activities Other Therapeutic Activities    Other Therapeutic Activities Completed standing habituation working toward bending down to cone on blue mat and then back to standing, completed x 15 reps bilaterally. Mild increase in dizziness, 2/10.           Vestibular Treatment/Exercise - 06/15/20 0001      Vestibular Treatment/Exercise   Vestibular Treatment Provided Habituation    Habituation Exercises 180 degree Turns;Standing Horizontal Head Turns;360 degree Turns      180 degree Turns   Number of Reps  8    Symptom Description  At  countertop completed 180 deg 2 x 4 reps, with dizziness reported at 4/10. rest break required to allow for resolution of symptoms. Added to HEP.            Balance Exercises - 06/15/20 0001      Balance Exercises: Standing   Standing Eyes Opened Narrow base of support (BOS);Head turns;Foam/compliant surface;Limitations    Standing Eyes Opened Limitations standing on airex completed horizontal/vertical head turns 2 x 10 reps witj narrow BOS.    Standing Eyes Closed Wide (BOA);Head turns;Foam/compliant surface;3 reps;30 secs;Limitations;Narrow base of support (BOS)    Standing Eyes Closed Limitations completed standing on airex with narrow BOS completed 3 x 30 seconds with eyes clsoed; then progressed to completing eyes closed  with horizontal/vertical head turns 2 x 10 reps, wide BOS.    Gait with Head Turns Forward;Intermittent upper extremity support;Limitations    Gait with Head Turns Limitations at countertop completed gait with horizontal/vertical head turns and light UE support. Completed x 3 laps down and back, progressing speed of head turns as progression.             PT Education - 06/15/20 1402    Education Details 180 deg turns added to HEP    Person(s) Educated Patient    Methods Explanation;Handout    Comprehension Verbalized understanding            PT Short Term Goals - 06/01/20 1701      PT SHORT TERM GOAL #1   Title Pt will participate in further assessment of falls risk with DGI    Status Achieved      PT SHORT TERM GOAL #2   Title Pt will initiate HEP focusing on balance, habituation and posture    Status Achieved             PT Long Term Goals - 06/01/20 1701      PT LONG TERM GOAL #1   Title Pt will report improvement in DPS to >/= 57 and increase in DFS by 5 points    Baseline DPS: 43; DFS: 47.6    Time 6    Period Weeks    Status New    Target Date 07/02/20      PT LONG TERM GOAL #2   Title Pt will demonstrate independence with final vestibular, balance and postural HEP    Time 6    Period Weeks    Status New    Target Date 07/02/20      PT LONG TERM GOAL #3   Title Pt will demonstrate 4 point improvement in DGI due to less dependence on cane    Baseline 18/24    Time 6    Period Weeks    Status Revised    Target Date 07/02/20      PT LONG TERM GOAL #4   Title Pt will report no dizziness with supine > sit, leaning head back at beauty salon, leaning down to the ground or with quick head/body turns    Time 6    Period Weeks    Status New    Target Date 07/02/20                 Plan - 06/15/20 1504    Clinical Impression Statement Patient feeling as had set back as has had more imbalance and dizziness last two days. Continued session focused  on habituation activities, increased dizziness noted with 180 deg turns and horizontal head turns. Intermittent rest breaks required  to allow for resolution of symptoms. Will continue to progress toward all LTGs.    Personal Factors and Comorbidities Comorbidity 3+;Age;Time since onset of injury/illness/exacerbation    Comorbidities Asthma, Breast CA, Erysipelas, HTN, hypothyroidism, L torn meniscus    Examination-Activity Limitations Bed Mobility;Bend;Locomotion Level;Reach Overhead    Examination-Participation Restrictions Cleaning;Laundry;Meal Prep    Stability/Clinical Decision Making Evolving/Moderate complexity    Rehab Potential Good    PT Frequency 2x / week    PT Duration 6 weeks    PT Treatment/Interventions ADLs/Self Care Home Management;Canalith Repostioning;Cryotherapy;Moist Heat;DME Instruction;Gait training;Functional mobility training;Therapeutic activities;Therapeutic exercise;Balance training;Neuromuscular re-education;Manual techniques;Patient/family education;Passive range of motion;Dry needling;Vestibular    PT Next Visit Plan How was addition of 180 deg turns? Continue to progress habituation and balance exercises; balance training on compliant surface. high level balance working toward reduced use of AD    PT Home Exercise Plan Access Code: [email or web address]    Consulted and Agree with Plan of Care Patient           Patient will benefit from skilled therapeutic intervention in order to improve the following deficits and impairments:  Difficulty walking,Dizziness,Decreased balance,Postural dysfunction  Visit Diagnosis: Dizziness and giddiness  Unsteadiness on feet  Difficulty in walking, not elsewhere classified     Problem List Patient Active Problem List   Diagnosis Date Noted  . Benign paroxysmal positional vertigo 05/12/2020  . Orthostatic hypotension 09/23/2019  . Dizziness 09/17/2019  . OAB (overactive bladder) 08/27/2019  . Pain of left thumb 08/27/2019  .  Family history of factor V Leiden mutation 08/11/2019  . Peripheral edema 08/11/2019  . Preventative health care 08/08/2019  . Hypothyroidism   . Hypertension   . Asthma   . Allergic rhinitis   . hx: right breast cancer 11/25/2011    Jones Bales, PT, DPT 06/15/2020, 3:06 PM  [facility] [address] [place], [place], [address] Phone: [phone]   Fax:  [phone]  Name: LOVINIA SNARE MRN: [id number] Date of Birth: Feb 26, 1936

## 2020-06-15 NOTE — Patient Instructions (Signed)
Access Code: [email or web address] URL: [email or web address] Date: 06/15/2020 Prepared by: Baldomero Lamy  Exercises Wide Tandem Stance with Eyes Closed - 1 x daily - 5 x weekly - 1 sets - 3 reps - 20 hold Wide Stance with Head Rotation on Foam Pad - 1 x daily - 7 x weekly - 1 sets - 10 reps Wide Stance with Head Nods on Foam Pad - 1 x daily - 7 x weekly - 1 sets - 10 reps Walking with Head Rotation - 1 x daily - 7 x weekly - 1 sets - 5 reps Wide Stance with Eyes Closed and Head Nods on Foam Pad - 1 x daily - 7 x weekly - 2 sets - 10 reps Wide Stance with Eyes Closed and Head Rotation on Foam Pad - 1 x daily - 7 x weekly - 2 sets - 10 reps Forward Walking with Half Turns - Slow - 1 x daily - 7 x weekly - 3 sets - 5 reps

## 2020-06-18 ENCOUNTER — Ambulatory Visit: Payer: Medicare Other

## 2020-06-22 ENCOUNTER — Ambulatory Visit: Payer: Medicare Other | Attending: Neurology | Admitting: Physical Therapy

## 2020-06-22 ENCOUNTER — Other Ambulatory Visit: Payer: Self-pay

## 2020-06-22 VITALS — BP 125/70 | HR 105

## 2020-06-22 DIAGNOSIS — R2681 Unsteadiness on feet: Secondary | ICD-10-CM | POA: Insufficient documentation

## 2020-06-22 DIAGNOSIS — R42 Dizziness and giddiness: Secondary | ICD-10-CM | POA: Diagnosis not present

## 2020-06-22 DIAGNOSIS — R262 Difficulty in walking, not elsewhere classified: Secondary | ICD-10-CM | POA: Diagnosis present

## 2020-06-22 NOTE — Therapy (Signed)
[facility] [address] [place], [place], [address] Phone: [phone]   Fax:  [phone]  Physical Therapy Treatment  Patient Details  Name: CHARMON THORSON MRN: [id number] Date of Birth: 1935/05/30 Referring Provider (PT): Melvenia Beam, MD   Encounter Date: 06/22/2020   PT End of Session - 06/22/20 1317    Visit Number 9    Number of Visits 13    Date for PT Re-Evaluation 07/02/20    Authorization Type UHC-Medicare; 10th visit PN    Progress Note Due on Visit 10    PT Start Time 1233    PT Stop Time 1314    PT Time Calculation (min) 41 min    Activity Tolerance Patient tolerated treatment well    Behavior During Therapy [facility] for tasks assessed/performed           Past Medical History:  Diagnosis Date  . Allergic rhinitis   . Asthma   . Breast cancer ([place]) 1987  . Erysipelas   . Hemorrhoids   . Hypertension   . Hypothyroidism     Past Surgical History:  Procedure Laterality Date  . BREAST LUMPECTOMY Right 1986   malignant  . TONSILLECTOMY      Vitals:   06/22/20 1258  BP: 125/70  Pulse: (!) 105  SpO2: (!) 89%     Subjective Assessment - 06/22/20 1236    Subjective Pt reports her head is feeling better but her lower extremities are still the main issue and reports difficulty walking still.  Her L knee has been hurting recently.  She does not feel stable enough to walk without suport.  Fatigues easy.  States she would like to start using the treadmill again although it has been a long time since she used one last.  Exercises with horizontal head turns are very challenging and walking around corners will provoke symptoms.    Pertinent History Asthma, Breast CA, Erysipelas, HTN, hypothyroidism    Diagnostic tests Has MRI coming up    Patient Stated Goals To get rid of the dizziness; "I don't want to feel anything in my head!"                             [facility] Adult PT  Treatment/Exercise - 06/22/20 0001      Ambulation/Gait   Ambulation/Gait Yes    Ambulation/Gait Assistance 5: Supervision    Ambulation/Gait Assistance Details treadmill; 8 min, speed 2.3-1.8 mph, 0% incline, BUE support    Ambulation Distance (Feet) 1373 Feet    Assistive device None    Gait Pattern Step-through pattern;Lateral hip instability;Narrow base of support;Trunk flexed   decreased toe off R     Exercises   Exercises Knee/Hip      Knee/Hip Exercises: Sidelying   Hip ABduction Strengthening;Right;Left;10 reps;Other (comment)   2 sets   Clams red theraband; 2 sets, 10 reps                  PT Education - 06/22/20 1316    Education Details hold on use of treadmill at home for now due to vitals and lightheadedness after - will continue to practice and assess safety with treadmill in therapy, Hip ABD exercises added to HEP    Person(s) Educated Patient    Methods Explanation;Handout    Comprehension Verbalized understanding            PT Short Term Goals - 06/01/20  [place] #1   Title Pt will participate in further assessment of falls risk with DGI    Status Achieved      PT SHORT TERM GOAL #2   Title Pt will initiate HEP focusing on balance, habituation and posture    Status Achieved             PT Long Term Goals - 06/01/20 1701      PT LONG TERM GOAL #1   Title Pt will report improvement in DPS to >/= 57 and increase in DFS by 5 points    Baseline DPS: 43; DFS: 47.6    Time 6    Period Weeks    Status New    Target Date 07/02/20      PT LONG TERM GOAL #2   Title Pt will demonstrate independence with final vestibular, balance and postural HEP    Time 6    Period Weeks    Status New    Target Date 07/02/20      PT LONG TERM GOAL #3   Title Pt will demonstrate 4 point improvement in DGI due to less dependence on cane    Baseline 18/24    Time 6    Period Weeks    Status Revised    Target Date 07/02/20      PT LONG  TERM GOAL #4   Title Pt will report no dizziness with supine > sit, leaning head back at beauty salon, leaning down to the ground or with quick head/body turns    Time 6    Period Weeks    Status New    Target Date 07/02/20                 Plan - 06/22/20 1318    Clinical Impression Statement Began treadmill training as pt expressed interest in returning to using it however she states it has been a long time.  Tolerated exercise well with mild lightheaded symptoms and SOB following 8 minutes of walking between 2.3 and 1.8 mph.  SpO2 89%.  Began strengthening exercises for hip abdductors to help hip instability and improve gait abnormalities.  Pt experienced localized tenderness R sidelying due to firmenesss of mat.    Personal Factors and Comorbidities Comorbidity 3+;Age;Time since onset of injury/illness/exacerbation    Comorbidities Asthma, Breast CA, Erysipelas, HTN, hypothyroidism, L torn meniscus    Examination-Activity Limitations Bed Mobility;Bend;Locomotion Level;Reach Overhead    Examination-Participation Restrictions Cleaning;Laundry;Meal Prep    Stability/Clinical Decision Making Evolving/Moderate complexity    Rehab Potential Good    PT Frequency 2x / week    PT Duration 6 weeks    PT Treatment/Interventions ADLs/Self Care Home Management;Canalith Repostioning;Cryotherapy;Moist Heat;DME Instruction;Gait training;Functional mobility training;Therapeutic activities;Therapeutic exercise;Balance training;Neuromuscular re-education;Manual techniques;Patient/family education;Passive range of motion;Dry needling;Vestibular    PT Next Visit Plan 10th visit PN!  Hip strengthening exercises for SLS and rotation?  Treadmill training with vital monitoring.  Work on balance with turns.  Continue to progress habituation and balance exercises; balance training on compliant surface. high level balance working toward reduced use of AD    PT Home Exercise Plan Access Code: [email or web address]     Consulted and Agree with Plan of Care Patient           Patient will benefit from skilled therapeutic intervention in order to improve the following deficits and impairments:  Difficulty walking,Dizziness,Decreased balance,Postural dysfunction  Visit Diagnosis: Dizziness and giddiness  Unsteadiness on  feet  Difficulty in walking, not elsewhere classified     Problem List Patient Active Problem List   Diagnosis Date Noted  . Benign paroxysmal positional vertigo 05/12/2020  . Orthostatic hypotension 09/23/2019  . Dizziness 09/17/2019  . OAB (overactive bladder) 08/27/2019  . Pain of left thumb 08/27/2019  . Family history of factor V Leiden mutation 08/11/2019  . Peripheral edema 08/11/2019  . Preventative health care 08/08/2019  . Hypothyroidism   . Hypertension   . Asthma   . Allergic rhinitis   . hx: right breast cancer 11/25/2011    Yetta Numbers, SPT 06/22/2020, 3:05 PM  [facility] [address] [place], [place], [address] Phone: [phone]   Fax:  [phone]  Name: INARA DIKE MRN: [id number] Date of Birth: 11/13/1935

## 2020-06-22 NOTE — Patient Instructions (Signed)
Access Code: [email or web address] URL: [email or web address] Date: 06/22/2020 Prepared by: Misty Stanley  Exercises Seated Nose to Knee Vestibular Habituation - 1 x daily - 5 x weekly - 2 sets - 5 reps Walking with Head Rotation - 1 x daily - 7 x weekly - 1 sets - 5 reps Wide Stance with Eyes Closed and Head Nods on Foam Pad - 1 x daily - 7 x weekly - 2 sets - 10 reps Wide Stance with Eyes Closed and Head Rotation on Foam Pad - 1 x daily - 7 x weekly - 2 sets - 10 reps Forward Walking with Half Turns - Slow - 1 x daily - 7 x weekly - 3 sets - 5 reps Sidelying Hip Abduction - 1 x daily - 7 x weekly - 2 sets - 10 reps Clam with Resistance - 1 x daily - 7 x weekly - 2 sets - 10 reps

## 2020-06-25 ENCOUNTER — Other Ambulatory Visit: Payer: Self-pay

## 2020-06-25 ENCOUNTER — Ambulatory Visit: Payer: Medicare Other | Admitting: Physical Therapy

## 2020-06-25 ENCOUNTER — Encounter: Payer: Self-pay | Admitting: Physical Therapy

## 2020-06-25 DIAGNOSIS — R2681 Unsteadiness on feet: Secondary | ICD-10-CM

## 2020-06-25 DIAGNOSIS — R262 Difficulty in walking, not elsewhere classified: Secondary | ICD-10-CM

## 2020-06-25 DIAGNOSIS — R42 Dizziness and giddiness: Secondary | ICD-10-CM | POA: Diagnosis not present

## 2020-06-25 NOTE — Therapy (Signed)
[facility] [address] [place], [place], [address] Phone: [phone]   Fax:  [phone]  Physical Therapy Treatment and 10th Visit Progress Note  Patient Details  Name: Julia Norton MRN: [id number] Date of Birth: [birth year] Referring Provider (PT): Melvenia Beam, MD   Encounter Date: 06/25/2020   Progress Note Reporting Period 05/18/2020 to 06/25/2020  See note below for Objective Data and Assessment of Progress/Goals.     PT End of Session - 06/25/20 1447    Visit Number 10    Number of Visits 13    Date for PT Re-Evaluation 07/02/20    Authorization Type UHC-Medicare; 10th visit PN    Progress Note Due on Visit 10    PT Start Time 1405    PT Stop Time 1440    PT Time Calculation (min) 35 min    Activity Tolerance Patient tolerated treatment well    Behavior During Therapy [facility] for tasks assessed/performed           Past Medical History:  Diagnosis Date  . Allergic rhinitis   . Asthma   . Breast cancer ([place]) 1987  . Erysipelas   . Hemorrhoids   . Hypertension   . Hypothyroidism     Past Surgical History:  Procedure Laterality Date  . BREAST LUMPECTOMY Right 1986   malignant  . TONSILLECTOMY      There were no vitals filed for this visit.   Subjective Assessment - 06/25/20 1409    Subjective She has been consistent with her execises, only one report of hip discomfot when lying on her bed and doing her exercises.  She walked about 14 minutes yesterday and reports no increase in pain in her hip.  No reports of dizziness.  She has been consistently walking around the house without the cane but still uses it when she goes outside.    Pertinent History Asthma, Breast CA, Erysipelas, HTN, hypothyroidism    Diagnostic tests Has MRI coming up    Patient Stated Goals To get rid of the dizziness; "I don't want to feel anything in my head!"    Currently in Pain? No/denies                    Vestibular Assessment - 06/25/20 1411      Positional Sensitivities   Nose to Right Knee No dizziness    Right Knee to Sitting No dizziness    Nose to Left Knee No dizziness    Left Knee to Sitting No dizziness    Head Turning x 5 No dizziness    Head Nodding x 5 No dizziness    Pivot Right in Standing No dizziness    Pivot Left in Standing No dizziness                    [facility] Adult PT Treatment/Exercise - 06/25/20 1427      Knee/Hip Exercises: Aerobic   Tread Mill 8 min, 0% incline, 2.0 mph, UE support.  No significant fatigue, no dizziness reported following.  Slight "achiness" in L knee.  Mild R circumduction, bilateral lateral hip drop.  Patient's Sp02 remained 90-91% on treadmill; HR: 102 bpm      Knee/Hip Exercises: Standing   Hip Abduction Stengthening;Both;2 sets;10 reps    Abduction Limitations Changed hip ABD exercise to standing to help alleviate greater trochanter pressure point she intermittently experiences.  She began to feel that same pain around rep 6 and  felt significant weakness.  Attempted alternating legs to limit single leg stance time and this seemed to help with no reproduction of symptoms.                  PT Education - 06/25/20 1445    Education Details modfying HEP to limit hip pain, continued progression of walking and modifying exercises to improve strength without pain    Person(s) Educated Patient    Methods Explanation    Comprehension Verbalized understanding            PT Short Term Goals - 06/01/20 1701      PT SHORT TERM GOAL #1   Title Pt will participate in further assessment of falls risk with DGI    Status Achieved      PT SHORT TERM GOAL #2   Title Pt will initiate HEP focusing on balance, habituation and posture    Status Achieved             PT Long Term Goals - 06/25/20 1514      PT LONG TERM GOAL #1   Title Pt will report improvement in DPS to >/= 57 and increase in DFS by 5  points    Baseline DPS: 43; DFS: 47.6    Time 6    Period Weeks    Status New      PT LONG TERM GOAL #2   Title Pt will demonstrate independence with final vestibular, balance and postural HEP    Time 6    Period Weeks    Status New      PT LONG TERM GOAL #3   Title Pt will demonstrate 4 point improvement in DGI due to less dependence on cane    Baseline 18/24    Time 6    Period Weeks    Status Revised      PT LONG TERM GOAL #4   Title Pt will report no dizziness with supine > sit, leaning head back at beauty salon, leaning down to the ground or with quick head/body turns    Baseline 06/25/20 0/5 MSQ    Time 6    Period Weeks    Status Achieved                 Plan - 06/25/20 1449    Clinical Impression Statement No provocation of dizziness or lightheadedness with various positional movements.  Abnormal gait pattern still evident with treadmill walking and pt reported mild achiness in L knee following.  Attempted modifying exercises to address hip pain in sidelying but pain was still brought on with standing hip ABD.  Will continue to address strengthening hip musculature without onset of pain.    Personal Factors and Comorbidities Comorbidity 3+;Age;Time since onset of injury/illness/exacerbation    Comorbidities Asthma, Breast CA, Erysipelas, HTN, hypothyroidism, L torn meniscus    Examination-Activity Limitations Bed Mobility;Bend;Locomotion Level;Reach Overhead    Examination-Participation Restrictions Cleaning;Laundry;Meal Prep    Stability/Clinical Decision Making Evolving/Moderate complexity    Rehab Potential Good    PT Frequency 2x / week    PT Duration 6 weeks    PT Treatment/Interventions ADLs/Self Care Home Management;Canalith Repostioning;Cryotherapy;Moist Heat;DME Instruction;Gait training;Functional mobility training;Therapeutic activities;Therapeutic exercise;Balance training;Neuromuscular re-education;Manual techniques;Patient/family education;Passive  range of motion;Dry needling;Vestibular    PT Next Visit Plan Start checking LTG and decide if we want to continue for a few more visits to address balance - recert due 3/29.  Reassess hip strengthening exercises without reproduction of pain.  Treadmill training with vital monitoring.  Balance exercises    PT Home Exercise Plan Access Code: [email or web address]    Consulted and Agree with Plan of Care Patient           Patient will benefit from skilled therapeutic intervention in order to improve the following deficits and impairments:  Difficulty walking,Dizziness,Decreased balance,Postural dysfunction  Visit Diagnosis: Unsteadiness on feet  Difficulty in walking, not elsewhere classified     Problem List Patient Active Problem List   Diagnosis Date Noted  . Benign paroxysmal positional vertigo 05/12/2020  . Orthostatic hypotension 09/23/2019  . Dizziness 09/17/2019  . OAB (overactive bladder) 08/27/2019  . Pain of left thumb 08/27/2019  . Family history of factor V Leiden mutation 08/11/2019  . Peripheral edema 08/11/2019  . Preventative health care 08/08/2019  . Hypothyroidism   . Hypertension   . Asthma   . Allergic rhinitis   . hx: right breast cancer 11/25/2011    Yetta Numbers, SPT 06/25/2020, 3:35 PM  [facility] [address] [place], [place], [address] Phone: [phone]   Fax:  [phone]  Name: Julia Norton MRN: [id number] Date of Birth: May 01, 1935

## 2020-06-30 ENCOUNTER — Other Ambulatory Visit: Payer: Self-pay

## 2020-06-30 ENCOUNTER — Ambulatory Visit: Payer: Medicare Other

## 2020-06-30 DIAGNOSIS — R262 Difficulty in walking, not elsewhere classified: Secondary | ICD-10-CM

## 2020-06-30 DIAGNOSIS — R2681 Unsteadiness on feet: Secondary | ICD-10-CM

## 2020-06-30 DIAGNOSIS — R42 Dizziness and giddiness: Secondary | ICD-10-CM | POA: Diagnosis not present

## 2020-06-30 NOTE — Therapy (Signed)
[facility] [address] [place], [place], [address] Phone: [phone]   Fax:  [phone]  Physical Therapy Treatment  Patient Details  Name: Julia Norton MRN: [id number] Date of Birth: Sep 26, 1935 Referring Provider (PT): Melvenia Beam, MD   Encounter Date: 06/30/2020   PT End of Session - 06/30/20 1237    Visit Number 11    Number of Visits 13    Date for PT Re-Evaluation 07/02/20    Authorization Type UHC-Medicare; 10th visit PN    Progress Note Due on Visit 20    PT Start Time 3614   patient arriving few minutes late   PT Stop Time 1315    PT Time Calculation (min) 40 min    Activity Tolerance Patient tolerated treatment well    Behavior During Therapy [facility] for tasks assessed/performed           Past Medical History:  Diagnosis Date  . Allergic rhinitis   . Asthma   . Breast cancer ([place]) 1987  . Erysipelas   . Hemorrhoids   . Hypertension   . Hypothyroidism     Past Surgical History:  Procedure Laterality Date  . BREAST LUMPECTOMY Right 1986   malignant  . TONSILLECTOMY      There were no vitals filed for this visit.   Subjective Assessment - 06/30/20 1235    Subjective Patient reports that she is being feel good overall. Patient reports still has some discomfort with the hip abduction exercises. Feeling just a tad bit of dizziness.    Pertinent History Asthma, Breast CA, Erysipelas, HTN, hypothyroidism    Diagnostic tests Has MRI coming up    Patient Stated Goals To get rid of the dizziness; "I don't want to feel anything in my head!"    Currently in Pain? No/denies              [facility] PT Assessment - 06/30/20 0001      Observation/Other Assessments   Focus on Therapeutic Outcomes (FOTO)  Dizziness Positional Status: 64; Dizziness Functional Status: 58.5              [facility] Adult PT Treatment/Exercise - 06/30/20 0001      Ambulation/Gait   Ambulation/Gait Yes     Ambulation/Gait Assistance 5: Supervision    Ambulation/Gait Assistance Details completed ambulation on outdoor unlevel surfaces x 600 ft including pavement and grass surface. Patient carrying SPC cane with her in case of use, but did not utilize on outdoor surfaces. Supervision throughout. one standing rest break required due to fatigue.    Ambulation Distance (Feet) 600 Feet    Assistive device None    Gait Pattern Step-through pattern;Lateral hip instability;Narrow base of support;Trunk flexed      High Level Balance   High Level Balance Activities Sudden stops;Head turns;Backward walking    High Level Balance Comments Outdoors on grass surface without AD completed high level balance including walking forward with horizontal/vertical head turns, sudden stops, and backwards walking. CGA intermittently requiring with addition of balance activities.             Balance Exercises - 06/30/20 0001      Balance Exercises: Standing   Standing Eyes Opened Narrow base of support (BOS);Head turns;Foam/compliant surface;Limitations    Standing Eyes Opened Limitations standing on airex completed horizontal/vertical head turns 2 x 10 reps with narrow BOS    Standing Eyes Closed Narrow base of support (BOS);Wide (BOA);Head turns;Foam/compliant surface;Limitations    Standing  Eyes Closed Limitations with wide BOS and vision removed on airex completed horizontal/vertical head turns x 10 reps. Completed stnading with narrow BOS and vision removed 3 x 30 secs.    Stepping Strategy Anterior;Posterior;Foam/compliant surface;Limitations    Stepping Strategy Limitations standing across red balance beam completed anterior/posterior stepping strategy x 15 reps bilaterally. CGA throughout.               PT Short Term Goals - 06/01/20 1701      PT SHORT TERM GOAL #1   Title Pt will participate in further assessment of falls risk with DGI    Status Achieved      PT SHORT TERM GOAL #2   Title Pt will  initiate HEP focusing on balance, habituation and posture    Status Achieved             PT Long Term Goals - 06/30/20 1334      PT LONG TERM GOAL #1   Title Pt will report improvement in DPS to >/= 57 and increase in DFS by 5 points    Baseline DPS: 43; DFS: 47.6. DPS: 64; DFS 58.5    Time 6    Period Weeks    Status Achieved      PT LONG TERM GOAL #2   Title Pt will demonstrate independence with final vestibular, balance and postural HEP    Time 6    Period Weeks    Status New      PT LONG TERM GOAL #3   Title Pt will demonstrate 4 point improvement in DGI due to less dependence on cane    Baseline 18/24    Time 6    Period Weeks    Status Revised      PT LONG TERM GOAL #4   Title Pt will report no dizziness with supine > sit, leaning head back at beauty salon, leaning down to the ground or with quick head/body turns    Baseline 06/25/20 0/5 MSQ    Time 6    Period Weeks    Status Achieved                 Plan - 06/30/20 1326    Clinical Impression Statement Today's skilled session included working on outdoor various surfaces with high level balance, intermittent CGA required. Continued activites focused on stepping strategy and improved balance on complaint surface. Began to assess LTG, with patient scoring 64 on DPS and 58.5 on DFS, meeting LTG #1. Will continue per POC.    Personal Factors and Comorbidities Comorbidity 3+;Age;Time since onset of injury/illness/exacerbation    Comorbidities Asthma, Breast CA, Erysipelas, HTN, hypothyroidism, L torn meniscus    Examination-Activity Limitations Bed Mobility;Bend;Locomotion Level;Reach Overhead    Examination-Participation Restrictions Cleaning;Laundry;Meal Prep    Stability/Clinical Decision Making Evolving/Moderate complexity    Rehab Potential Good    PT Frequency 2x / week    PT Duration 6 weeks    PT Treatment/Interventions ADLs/Self Care Home Management;Canalith Repostioning;Cryotherapy;Moist Heat;DME  Instruction;Gait training;Functional mobility training;Therapeutic activities;Therapeutic exercise;Balance training;Neuromuscular re-education;Manual techniques;Patient/family education;Passive range of motion;Dry needling;Vestibular    PT Next Visit Plan Finish checking LTG + Update HEP and planned D/C as Patient wanting to take break from PT services. Reassess hip strengthening exercises without reproduction of pain.  Treadmill training with vital monitoring.  Balance exercises    PT Home Exercise Plan Access Code: [email or web address]    Consulted and Agree with Plan of Care Patient  Patient will benefit from skilled therapeutic intervention in order to improve the following deficits and impairments:  Difficulty walking,Dizziness,Decreased balance,Postural dysfunction  Visit Diagnosis: Unsteadiness on feet  Difficulty in walking, not elsewhere classified  Dizziness and giddiness     Problem List Patient Active Problem List   Diagnosis Date Noted  . Benign paroxysmal positional vertigo 05/12/2020  . Orthostatic hypotension 09/23/2019  . Dizziness 09/17/2019  . OAB (overactive bladder) 08/27/2019  . Pain of left thumb 08/27/2019  . Family history of factor V Leiden mutation 08/11/2019  . Peripheral edema 08/11/2019  . Preventative health care 08/08/2019  . Hypothyroidism   . Hypertension   . Asthma   . Allergic rhinitis   . hx: right breast cancer 11/25/2011    Jones Bales, PT, DPT 06/30/2020, 1:34 PM  [facility] [address] [place], [place], [address] Phone: [phone]   Fax:  [phone]  Name: Julia Norton MRN: [id number] Date of Birth: 1935/11/24

## 2020-07-02 ENCOUNTER — Encounter: Payer: Self-pay | Admitting: Physical Therapy

## 2020-07-02 ENCOUNTER — Ambulatory Visit: Payer: Medicare Other | Admitting: Physical Therapy

## 2020-07-02 ENCOUNTER — Other Ambulatory Visit: Payer: Self-pay

## 2020-07-02 DIAGNOSIS — R42 Dizziness and giddiness: Secondary | ICD-10-CM

## 2020-07-02 DIAGNOSIS — R2681 Unsteadiness on feet: Secondary | ICD-10-CM

## 2020-07-02 DIAGNOSIS — R262 Difficulty in walking, not elsewhere classified: Secondary | ICD-10-CM

## 2020-07-02 NOTE — Patient Instructions (Addendum)
Access Code: [email or web address] URL: [email or web address] Date: 07/02/2020 Prepared by: Misty Stanley  Exercises Reach to floor with 180 deg turn to Left and Right - 1 x daily - 5 x weekly - 2 sets - 4 reps Walking Forwards and Backwards with Head Turns every 3 steps - 1 x daily - 7 x weekly - 4 sets Wide Stance with Eyes Closed and Head Nods on Foam Pad - 1 x daily - 7 x weekly - 2 sets - 10 reps Wide Stance with Eyes Closed and Head Rotation on Foam Pad - 1 x daily - 7 x weekly - 2 sets - 10 reps Standing Hip Abduction with Counter Support - 1 x daily - 7 x weekly - 2 sets - 10 reps

## 2020-07-02 NOTE — Therapy (Signed)
[facility] [address] [place], [place], [address] Phone: [phone]   Fax:  [phone]  Physical Therapy Treatment and Discharge Summary  Patient Details  Name: Julia Norton MRN: [id number] Date of Birth: Oct 10, 1935 Referring Provider (PT): Melvenia Beam, MD   Encounter Date: 07/02/2020   PT End of Session - 07/02/20 1502    Visit Number 12    Number of Visits 13    Date for PT Re-Evaluation 07/02/20    Authorization Type UHC-Medicare; 10th visit PN    Progress Note Due on Visit 20    PT Start Time 1236    PT Stop Time 1315    PT Time Calculation (min) 39 min    Activity Tolerance Patient tolerated treatment well    Behavior During Therapy [facility] for tasks assessed/performed           Past Medical History:  Diagnosis Date  . Allergic rhinitis   . Asthma   . Breast cancer ([place]) 1987  . Erysipelas   . Hemorrhoids   . Hypertension   . Hypothyroidism     Past Surgical History:  Procedure Laterality Date  . BREAST LUMPECTOMY Right 1986   malignant  . TONSILLECTOMY      There were no vitals filed for this visit.   Subjective Assessment - 07/02/20 1240    Subjective Pt is walking on treadmill at home; yesterday walked 20 minutes on treadmill at 2.3 mph without any symptoms.  Still having slight hip pain but not right now.  Periodically pt experiences dizziness but doesn't remember what brought it on.    Pertinent History Asthma, Breast CA, Erysipelas, HTN, hypothyroidism    Diagnostic tests Has MRI coming up    Patient Stated Goals To get rid of the dizziness; "I don't want to feel anything in my head!"    Currently in Pain? No/denies              [facility] PT Assessment - 07/02/20 1243      Observation/Other Assessments   Focus on Therapeutic Outcomes (FOTO)  Dizziness Positional Status: 64; Dizziness Functional Status: 58.5      Dynamic Gait Index   Level Surface Normal   no antalgic  gait today   Change in Gait Speed Normal    Gait with Horizontal Head Turns Mild Impairment    Gait with Vertical Head Turns Normal    Gait and Pivot Turn Normal    Step Over Obstacle Normal    Step Around Obstacles Normal    Steps Mild Impairment    Total Score 22    DGI comment: 22/24                         [facility] Adult PT Treatment/Exercise - 07/02/20 1501      Exercises   Exercises Other Exercises    Other Exercises  Reviewed HEP and revised each exercise based on patient's progress.  See final HEP below.  Pt return demonstrated each exercise safely.           Access Code: [email or web address] URL: [email or web address] Date: 07/02/2020 Prepared by: Misty Stanley  Exercises Standing: Reach to floor with 180 deg turn to Left and Right - 1 x daily - 5 x weekly - 2 sets - 4 reps Walking Forwards and Backwards with Head Turns every 3 steps - 1 x daily - 7 x weekly - 4 sets Wide Stance with Eyes Closed and  Head Nods on Foam Pad - 1 x daily - 7 x weekly - 2 sets - 10 reps Wide Stance with Eyes Closed and Head Rotation on Foam Pad - 1 x daily - 7 x weekly - 2 sets - 10 reps Standing Hip Abduction with Counter Support - 1 x daily - 7 x weekly - 2 sets - 10 reps          PT Education - 07/02/20 1502    Education Details final HEP, D/C today    Person(s) Educated Patient    Methods Explanation;Demonstration;Handout    Comprehension Verbalized understanding;Returned demonstration            PT Short Term Goals - 06/01/20 1701      PT SHORT TERM GOAL #1   Title Pt will participate in further assessment of falls risk with DGI    Status Achieved      PT SHORT TERM GOAL #2   Title Pt will initiate HEP focusing on balance, habituation and posture    Status Achieved             PT Long Term Goals - 07/02/20 1509      PT LONG TERM GOAL #1   Title Pt will report improvement in DPS to >/= 57 and increase in DFS by 5 points    Baseline DPS: 43; DFS:  47.6. DPS: 64; DFS 58.5    Time 6    Period Weeks    Status Achieved      PT LONG TERM GOAL #2   Title Pt will demonstrate independence with final vestibular, balance and postural HEP    Time 6    Period Weeks    Status Achieved      PT LONG TERM GOAL #3   Title Pt will demonstrate 4 point improvement in DGI due to less dependence on cane    Baseline 18/24 > 22/24    Time 6    Period Weeks    Status Achieved      PT LONG TERM GOAL #4   Title Pt will report no dizziness with supine > sit, leaning head back at beauty salon, leaning down to the ground or with quick head/body turns    Baseline 06/25/20 0/5 MSQ    Time 6    Period Weeks    Status Achieved                 Plan - 07/02/20 1510    Clinical Impression Statement Performed assessment of patient progress towards LTG.  Pt has made good progress and has met all LTG demonstrating decreased symptoms of dizziness and motion sensitivity, decrease falls risk, improved dynamic standing balance and improved safety with gait.  Pt is independent with her final HEP and is ready for D/C.    Personal Factors and Comorbidities Comorbidity 3+;Age;Time since onset of injury/illness/exacerbation    Comorbidities Asthma, Breast CA, Erysipelas, HTN, hypothyroidism, L torn meniscus    Examination-Activity Limitations Bed Mobility;Bend;Locomotion Level;Reach Overhead    Examination-Participation Restrictions Cleaning;Laundry;Meal Prep    PT Treatment/Interventions ADLs/Self Care Home Management;Canalith Repostioning;Cryotherapy;Moist Heat;DME Instruction;Gait training;Functional mobility training;Therapeutic activities;Therapeutic exercise;Balance training;Neuromuscular re-education;Manual techniques;Patient/family education;Passive range of motion;Dry needling;Vestibular    PT Next Visit Plan D/C    PT Home Exercise Plan Access Code: [email or web address]    Consulted and Agree with Plan of Care Patient           Patient will benefit from skilled  therapeutic intervention in order to improve the  following deficits and impairments:  Difficulty walking,Dizziness,Decreased balance,Postural dysfunction  Visit Diagnosis: Unsteadiness on feet  Difficulty in walking, not elsewhere classified  Dizziness and giddiness     Problem List Patient Active Problem List   Diagnosis Date Noted  . Benign paroxysmal positional vertigo 05/12/2020  . Orthostatic hypotension 09/23/2019  . Dizziness 09/17/2019  . OAB (overactive bladder) 08/27/2019  . Pain of left thumb 08/27/2019  . Family history of factor V Leiden mutation 08/11/2019  . Peripheral edema 08/11/2019  . Preventative health care 08/08/2019  . Hypothyroidism   . Hypertension   . Asthma   . Allergic rhinitis   . hx: right breast cancer 11/25/2011    PHYSICAL THERAPY DISCHARGE SUMMARY  Visits from Start of Care: 12  Current functional level related to goals / functional outcomes: See LTG achievement and impression statement above   Remaining deficits: Mild hip pain   Education / Equipment: Vestibular/Balance HEP  Plan: Patient agrees to discharge.  Patient goals were met. Patient is being discharged due to meeting the stated rehab goals.  ?????     Rico Junker, PT, DPT 07/02/20    3:14 PM    [facility] [address] [place], [place], [address] Phone: [phone]   Fax:  [phone]  Name: Julia Norton MRN: [id number] Date of Birth: [birth year]

## 2020-07-29 ENCOUNTER — Other Ambulatory Visit: Payer: Self-pay | Admitting: Internal Medicine

## 2020-07-29 NOTE — Telephone Encounter (Signed)
Please refill as per office routine med refill policy (all routine meds refilled for 3 mo or monthly per pt preference up to one year from last visit, then month to month grace period for 3 mo, then further med refills will have to be denied)  

## 2020-09-20 ENCOUNTER — Other Ambulatory Visit: Payer: Self-pay | Admitting: Internal Medicine

## 2020-09-20 NOTE — Telephone Encounter (Signed)
Please refill as per office routine med refill policy (all routine meds refilled for 3 mo or monthly per pt preference up to one year from last visit, then month to month grace period for 3 mo, then further med refills will have to be denied)  

## 2020-09-25 ENCOUNTER — Ambulatory Visit (INDEPENDENT_AMBULATORY_CARE_PROVIDER_SITE_OTHER): Payer: Medicare Other | Admitting: Internal Medicine

## 2020-09-25 ENCOUNTER — Other Ambulatory Visit: Payer: Self-pay

## 2020-09-25 ENCOUNTER — Encounter: Payer: Self-pay | Admitting: Internal Medicine

## 2020-09-25 VITALS — BP 128/80 | HR 64 | Temp 98.1°F | Ht 68.0 in | Wt 178.0 lb

## 2020-09-25 DIAGNOSIS — E039 Hypothyroidism, unspecified: Secondary | ICD-10-CM

## 2020-09-25 DIAGNOSIS — E538 Deficiency of other specified B group vitamins: Secondary | ICD-10-CM | POA: Diagnosis not present

## 2020-09-25 DIAGNOSIS — R600 Localized edema: Secondary | ICD-10-CM

## 2020-09-25 DIAGNOSIS — R42 Dizziness and giddiness: Secondary | ICD-10-CM | POA: Diagnosis not present

## 2020-09-25 DIAGNOSIS — R609 Edema, unspecified: Secondary | ICD-10-CM | POA: Diagnosis not present

## 2020-09-25 DIAGNOSIS — Z0001 Encounter for general adult medical examination with abnormal findings: Secondary | ICD-10-CM | POA: Diagnosis not present

## 2020-09-25 DIAGNOSIS — R739 Hyperglycemia, unspecified: Secondary | ICD-10-CM

## 2020-09-25 DIAGNOSIS — I10 Essential (primary) hypertension: Secondary | ICD-10-CM | POA: Diagnosis not present

## 2020-09-25 DIAGNOSIS — E559 Vitamin D deficiency, unspecified: Secondary | ICD-10-CM

## 2020-09-25 LAB — CBC WITH DIFFERENTIAL/PLATELET
Basophils Absolute: 0 10*3/uL (ref 0.0–0.1)
Basophils Relative: 0.7 % (ref 0.0–3.0)
Eosinophils Absolute: 0.3 10*3/uL (ref 0.0–0.7)
Eosinophils Relative: 4.8 % (ref 0.0–5.0)
HCT: 39 % (ref 36.0–46.0)
Hemoglobin: 13.5 g/dL (ref 12.0–15.0)
Lymphocytes Relative: 17.4 % (ref 12.0–46.0)
Lymphs Abs: 1.2 10*3/uL (ref 0.7–4.0)
MCHC: 34.6 g/dL (ref 30.0–36.0)
MCV: 85.6 fl (ref 78.0–100.0)
Monocytes Absolute: 0.6 10*3/uL (ref 0.1–1.0)
Monocytes Relative: 7.9 % (ref 3.0–12.0)
Neutro Abs: 4.9 10*3/uL (ref 1.4–7.7)
Neutrophils Relative %: 69.2 % (ref 43.0–77.0)
Platelets: 180 10*3/uL (ref 150.0–400.0)
RBC: 4.55 Mil/uL (ref 3.87–5.11)
RDW: 13.9 % (ref 11.5–15.5)
WBC: 7.1 10*3/uL (ref 4.0–10.5)

## 2020-09-25 LAB — BASIC METABOLIC PANEL
BUN: 23 mg/dL (ref 6–23)
CO2: 29 mEq/L (ref 19–32)
Calcium: 9.3 mg/dL (ref 8.4–10.5)
Chloride: 100 mEq/L (ref 96–112)
Creatinine, Ser: 1.09 mg/dL (ref 0.40–1.20)
GFR: 47.05 mL/min — ABNORMAL LOW (ref >60.00)
Glucose, Bld: 93 mg/dL (ref 70–99)
Potassium: 4 mEq/L (ref 3.5–5.1)
Sodium: 138 mEq/L (ref 135–145)

## 2020-09-25 LAB — LIPID PANEL
Cholesterol: 168 mg/dL (ref 0–200)
HDL: 49 mg/dL (ref >39.00)
LDL Cholesterol: 103 mg/dL — ABNORMAL HIGH (ref 0–99)
NonHDL: 119.21
Total CHOL/HDL Ratio: 3
Triglycerides: 79 mg/dL (ref 0.0–149.0)
VLDL: 15.8 mg/dL (ref 0.0–40.0)

## 2020-09-25 LAB — URINALYSIS, ROUTINE W REFLEX MICROSCOPIC
Bilirubin Urine: NEGATIVE
Hgb urine dipstick: NEGATIVE
Ketones, ur: NEGATIVE
Nitrite: NEGATIVE
RBC / HPF: NONE SEEN (ref 0–?)
Specific Gravity, Urine: 1.015 (ref 1.000–1.030)
Total Protein, Urine: NEGATIVE
Urine Glucose: NEGATIVE
Urobilinogen, UA: 0.2 (ref 0.0–1.0)
pH: 7 (ref 5.0–8.0)

## 2020-09-25 LAB — HEPATIC FUNCTION PANEL
ALT: 14 U/L (ref 0–35)
AST: 20 U/L (ref 0–37)
Albumin: 4.3 g/dL (ref 3.5–5.2)
Alkaline Phosphatase: 65 U/L (ref 39–117)
Bilirubin, Direct: 0.1 mg/dL (ref 0.0–0.3)
Total Bilirubin: 0.7 mg/dL (ref 0.2–1.2)
Total Protein: 6.6 g/dL (ref 6.0–8.3)

## 2020-09-25 LAB — VITAMIN B12: Vitamin B-12: 1159 pg/mL — ABNORMAL HIGH (ref 211–911)

## 2020-09-25 LAB — HEMOGLOBIN A1C: Hgb A1c MFr Bld: 5.2 % (ref 4.6–6.5)

## 2020-09-25 LAB — TSH: TSH: 3.21 u[IU]/mL (ref 0.35–4.50)

## 2020-09-25 LAB — VITAMIN D 25 HYDROXY (VIT D DEFICIENCY, FRACTURES): VITD: 42.75 ng/mL (ref 30.00–100.00)

## 2020-09-25 LAB — T4, FREE: Free T4: 1.28 ng/dL (ref 0.60–1.60)

## 2020-09-25 MED ORDER — SOLIFENACIN SUCCINATE 10 MG PO TABS: 10.0000 mg | ORAL_TABLET | Freq: Every day | ORAL | 3 refills | Status: DC

## 2020-09-25 MED ORDER — LEVOTHYROXINE SODIUM 100 MCG PO TABS: 100.0000 ug | ORAL_TABLET | Freq: Every day | ORAL | 3 refills | Status: DC

## 2020-09-25 MED ORDER — VALSARTAN-HYDROCHLOROTHIAZIDE 320-25 MG PO TABS: 1.0000 | ORAL_TABLET | Freq: Every day | ORAL | 3 refills | Status: DC

## 2020-09-25 MED ORDER — HYDRALAZINE HCL 50 MG PO TABS
50.0000 mg | ORAL_TABLET | Freq: Three times a day (TID) | ORAL | 3 refills | Status: DC
Start: 2020-09-25 — End: 2020-12-07

## 2020-09-25 NOTE — Patient Instructions (Addendum)
Ok to try the hydralazine at 25 mg three times per day for 2 days, but if no change in the dizziness, this should go back to the 50 mg  Please continue all other medications as before, and refills have been done if requested.  Please have the pharmacy call with any other refills you may need.  Please continue your efforts at being more active, low cholesterol diet, and weight control.  You are otherwise up to date with prevention measures today.  Please keep your appointments with your specialists as you may have planned  Please go to the LAB at the blood drawing area for the tests to be done  You will be contacted by phone if any changes need to be made immediately.  Otherwise, you will receive a letter about your results with an explanation, but please check with MyChart first.  Please remember to sign up for MyChart if you have not done so, as this will be important to you in the future with finding out test results, communicating by private email, and scheduling acute appointments online when needed.  Please make an Appointment to return in 6 months, or sooner if neede

## 2020-09-25 NOTE — Progress Notes (Signed)
Patient ID: Julia Norton, female   DOB: 31-May-1935, 85 y.o.   MRN: [id number]         Chief Complaint:: wellness exam and dizziness, chronic left knee pain, and neck pain, hypothyroidism, leg swelling       HPI:  Julia Norton is a 85 y.o. female here for wellness exam; declines covid booster, shingrix for now; o/w up to date with preventive referrals and immunizations                        Also c/o significant orthostasis like symptoms worse first thing in the AM but also with every time she stands in the past several months though drinks plenty of fluids; Pt denies chest pain, increased sob or doe, wheezing, orthopnea, PND, palpitations, or syncope, though also has had some feet and leg swelling for over 1 mo.  Overall good compliance with meds. Using cane all the time now for stability and no recent falls.  Also has chronic left knee pain mild to mod intermittent without swelling, giveaways or falls, but ongoing for several yrs just gradualy worsening, also makes it more difficult to stand.  Also has chronic neck pain worsening last 6 mo with grinding, maybe a bit worse to left lower neck.   Pt denies polydipsia, polyuria, or new focal neuro s/s.   Denies hyper or hypo thyroid symptoms such as voice, skin or hair change. Wt Readings from Last 3 Encounters:  09/25/20 178 lb (80.7 kg)  05/12/20 181 lb (82.1 kg)  12/25/19 179 lb (81.2 kg)   BP Readings from Last 3 Encounters:  09/25/20 128/80  06/22/20 125/70  12/05/19 130/72   Immunization History  Administered Date(s) Administered   DTaP 04/18/2006   Fluad Quad(high Dose 65+) 01/20/2020   Influenza Split 01/17/2019   PFIZER(Purple Top)SARS-COV-2 Vaccination 06/01/2019, 06/26/2019, 02/04/2020   Pneumococcal Conjugate-13 08/08/2019   Pneumococcal Polysaccharide-23 04/18/2006   Tdap 08/08/2019   There are no preventive care reminders to display for this patient.     Past Medical History:  Diagnosis Date   Allergic rhinitis     Asthma    Breast cancer ([place]) 1987   Erysipelas    Hemorrhoids    Hypertension    Hypothyroidism    Past Surgical History:  Procedure Laterality Date   BREAST LUMPECTOMY Right 1986   malignant   TONSILLECTOMY      reports that she quit smoking about 59 years ago. Her smoking use included cigarettes. She has never used smokeless tobacco. She reports that she does not drink alcohol and does not use drugs. family history includes Arthritis in her father, mother, and sister; COPD in her sister; Celiac disease in her sister; Heart disease in her mother; Heart disease (age of onset: 61) in her father; Heart disease (age of onset: 82) in her sister; Hypertension in her mother and sister. Allergies  Allergen Reactions   Penicillins    Current Outpatient Medications on File Prior to Visit  Medication Sig Dispense Refill   aspirin 81 MG tablet Take 2 tablets by mouth once daily     B Complex Vitamins (VITAMIN B COMPLEX PO) Take by mouth daily.     Cholecalciferol (VITAMIN D3) 25 MCG (1000 UT) CAPS Take by mouth daily.     loratadine (CLARITIN) 10 MG tablet Take 10 mg by mouth daily.     Magnesium 400 MG CAPS Take by mouth.     Multiple Vitamin (MULTIVITAMIN PO) Take  1 tablet by mouth daily.     Omega-3 Fatty Acids (FISH OIL) 1000 MG CAPS Take 1 capsule by mouth daily.     augmented betamethasone dipropionate (DIPROLENE-AF) 0.05 % cream Apply topically. 1-2 times per day (Patient not taking: Reported on 09/25/2020)     No current facility-administered medications on file prior to visit.        ROS:  All others reviewed and negative.  Objective        PE:  BP 128/80 (BP Location: Right Arm, Patient Position: Sitting, Cuff Size: Normal)   Pulse 64   Temp 98.1 F (36.7 C) (Oral)   Ht 5\' 8"  (1.727 m)   Wt 178 lb (80.7 kg)   SpO2 97%   BMI 27.06 kg/m                 Constitutional: Pt appears in NAD               HENT: Head: NCAT.                Right Ear: External ear normal.                  Left Ear: External ear normal.                Eyes: . Pupils are equal, round, and reactive to light. Conjunctivae and EOM are normal               Nose: without d/c or deformity               Neck: Neck supple. Gross normal ROM               Cardiovascular: Normal rate and regular rhythm.                 Pulmonary/Chest: Effort normal and breath sounds without rales or wheezing.                Abd:  Soft, NT, ND, + BS, no organomegaly               Neurological: Pt is alert. At baseline orientation, motor grossly intact               Skin: Skin is warm. No rashes, no other new lesions, LE edema - trace to 1+ bilat swelling to mid legs               Psychiatric: Pt behavior is normal without agitation   Micro: none  Cardiac tracings I have personally interpreted today:  none  Pertinent Radiological findings (summarize): none   Lab Results  Component Value Date   WBC 7.1 09/25/2020   HGB 13.5 09/25/2020   HCT 39.0 09/25/2020   PLT 180.0 09/25/2020   GLUCOSE 93 09/25/2020   CHOL 168 09/25/2020   TRIG 79.0 09/25/2020   HDL 49.00 09/25/2020   LDLCALC 103 (H) 09/25/2020   ALT 14 09/25/2020   AST 20 09/25/2020   NA 138 09/25/2020   K 4.0 09/25/2020   CL 100 09/25/2020   CREATININE 1.09 09/25/2020   BUN 23 09/25/2020   CO2 29 09/25/2020   TSH 3.21 09/25/2020   HGBA1C 5.2 09/25/2020   Assessment/Plan:  Julia Norton is a 85 y.o. White or Caucasian [1] female with  has a past medical history of Allergic rhinitis, Asthma, Breast cancer ([place]) (1987), Erysipelas, Hemorrhoids, Hypertension, and Hypothyroidism.  Encounter for well adult exam with abnormal findings Age  and sex appropriate education and counseling updated with regular exercise and diet Referrals for preventative services - none needed Immunizations addressed - decines shingrix and covid booster Smoking counseling  - none needed Evidence for depression or other mood disorder - none significant Most  recent labs reviewed. I have personally reviewed and have noted: 1) the patient's medical and social history 2) The patient's current medications and supplements 3) The patient's height, weight, and BMI have been recorded in the chart   Hypothyroidism Lab Results  Component Value Date   TSH 3.21 09/25/2020   Stable, pt to continue levothyroxine   Hypertension BP Readings from Last 3 Encounters:  09/25/20 128/80  06/22/20 125/70  12/05/19 130/72   Stable, pt to continue medical treatment diovan hct, hydralazine   Peripheral edema Chronic stable overall, would avoid diuretic with current orthostasis like symptoms, to f/u any worsening symptoms or concerns  Dizziness With hx of vertigo, but this more c/w orthostasis like symptoms; ok for decreased hydralazine 25 tid to see if improves over the next wk, but if no, can take prior dose  Followup: Return in about 6 months (around 03/27/2021).  Cathlean Cower, MD 09/26/2020 8:06 PM [facility]

## 2020-09-26 ENCOUNTER — Encounter: Payer: Self-pay | Admitting: Internal Medicine

## 2020-09-26 NOTE — Assessment & Plan Note (Signed)
With hx of vertigo, but this more c/w orthostasis like symptoms; ok for decreased hydralazine 25 tid to see if improves over the next wk, but if no, can take prior dose

## 2020-09-26 NOTE — Assessment & Plan Note (Signed)
BP Readings from Last 3 Encounters:  09/25/20 128/80  06/22/20 125/70  12/05/19 130/72   Stable, pt to continue medical treatment diovan hct, hydralazine

## 2020-09-26 NOTE — Assessment & Plan Note (Signed)
Chronic stable overall, would avoid diuretic with current orthostasis like symptoms, to f/u any worsening symptoms or concerns

## 2020-09-26 NOTE — Assessment & Plan Note (Signed)
Age and sex appropriate education and counseling updated with regular exercise and diet Referrals for preventative services - none needed Immunizations addressed - decines shingrix and covid booster Smoking counseling  - none needed Evidence for depression or other mood disorder - none significant Most recent labs reviewed. I have personally reviewed and have noted: 1) the patient's medical and social history 2) The patient's current medications and supplements 3) The patient's height, weight, and BMI have been recorded in the chart

## 2020-09-26 NOTE — Assessment & Plan Note (Signed)
Lab Results  Component Value Date   TSH 3.21 09/25/2020   Stable, pt to continue levothyroxine

## 2020-10-09 ENCOUNTER — Other Ambulatory Visit: Payer: Self-pay | Admitting: Internal Medicine

## 2020-10-09 DIAGNOSIS — Z1231 Encounter for screening mammogram for malignant neoplasm of breast: Secondary | ICD-10-CM

## 2020-10-12 NOTE — Telephone Encounter (Signed)
error 

## 2020-10-29 ENCOUNTER — Telehealth: Payer: Self-pay | Admitting: Internal Medicine

## 2020-10-29 NOTE — Telephone Encounter (Signed)
   Patient requesting bone density order and order for cologuard kit

## 2020-10-29 NOTE — Telephone Encounter (Signed)
Sorry, the cologuard is not recommended at her age, since a positive test would lead to a high risk colonoscopy which is not recommended at her age for screening purposes  The last DXA bone density was just done may 2021 and is only recommended for f/u at 2-3 yrs due to the bone taking this long to show significant changes  Ok to let pt know

## 2020-10-30 NOTE — Telephone Encounter (Addendum)
Left patient a voice mail

## 2020-11-03 ENCOUNTER — Ambulatory Visit: Payer: Medicare Other | Attending: Internal Medicine

## 2020-11-03 ENCOUNTER — Telehealth: Payer: Self-pay | Admitting: Internal Medicine

## 2020-11-03 DIAGNOSIS — Z23 Encounter for immunization: Secondary | ICD-10-CM

## 2020-11-03 NOTE — Progress Notes (Signed)
   Covid-19 Vaccination Clinic  Name:  Julia Norton    MRN: [id number] DOB: 05-03-1935  11/03/2020  [name] was observed post Covid-19 immunization for 15 minutes without incident. She was provided with Vaccine Information Sheet and instruction to access the V-Safe system.   [name] was instructed to call 911 with any severe reactions post vaccine: Difficulty breathing  Swelling of face and throat  A fast heartbeat  A bad rash all over body  Dizziness and weakness   Immunizations Administered     Name Date Dose VIS Date Route   PFIZER Comrnaty(Gray TOP) Covid-19 Vaccine 11/03/2020 12:58 PM 0.3 mL 03/26/2020 Intramuscular   Manufacturer: [place]   Lot: [id number]   [place]: [phone]

## 2020-11-03 NOTE — Telephone Encounter (Signed)
[facility] FYI 7.16.22:  ---She just fell and hit her head on edge of table. Has a big lump at the site, denies bleeding, does have ice pack on which she says has decreased swelling and took regular aspirin. Fall happened 30-40 minutes ago. Denies any scrapes or abrasions. States she was cleaning and possibly tripped over something, unsure how exactly it happened. States last year she thought she was having stroke, was evaluated by a cardiologist and neurologist which showed nothing, but since that episode her head "has not been right". States she has had vertigo in the past. States she might of stood up too quickly.  Advised to go to ED now

## 2020-11-09 ENCOUNTER — Other Ambulatory Visit (HOSPITAL_BASED_OUTPATIENT_CLINIC_OR_DEPARTMENT_OTHER): Payer: Self-pay

## 2020-11-09 MED ORDER — COVID-19 MRNA VAC-TRIS(PFIZER) 30 MCG/0.3ML IM SUSP
INTRAMUSCULAR | 0 refills | Status: DC
  Filled 2020-11-09: qty 0.3, 1d supply, fill #0

## 2020-12-02 ENCOUNTER — Ambulatory Visit
Admission: RE | Admit: 2020-12-02 | Discharge: 2020-12-02 | Disposition: A | Discharge status: Home / Self Care | Payer: Medicare Other | Source: Ambulatory Visit | Attending: Internal Medicine | Admitting: Internal Medicine

## 2020-12-02 ENCOUNTER — Other Ambulatory Visit: Payer: Self-pay

## 2020-12-02 DIAGNOSIS — Z1231 Encounter for screening mammogram for malignant neoplasm of breast: Secondary | ICD-10-CM

## 2020-12-06 DIAGNOSIS — R0602 Shortness of breath: Secondary | ICD-10-CM | POA: Insufficient documentation

## 2020-12-06 NOTE — Progress Notes (Addendum)
Cardiology Office Note   Date:  12/07/2020   ID:  Theta, Verzosa 21-Sep-1935, MRN [id number]  PCP:  Biagio Borg, MD  Cardiologist:   None   Chief Complaint  Patient presents with   Dizziness       History of Present Illness: Julia Norton is a 85 y.o. female who is referred by Biagio Borg, MD for evaluation of dizziness.   After my first visit with her I sent her for a monitor.  She did not have any arrhythmias.  Echo was unremarkable.   She had dyspnea and so in 2021 I sent her for [facility] which was negative for ischemia.   She has had an extensive work-up of her dizziness I was able to review some records of neurology and her MRI and head CT.  She is told she does not have vertigo.  She has not had any acute abnormalities but she has been getting some therapy and had some improvement.  Still she is very dizzy when she stands up and she has not been orthostatic record.  She is dizzy if she turns her head in a certain position.  She tries to be very active and she still actually walks behind a self-propelled mower and does all of her activities of daily living.  She has not had any frank syncope.  She has not had any chest pressure, neck or arm discomfort.  She does not have any palpitations.  She has had no shortness of breath.  Past Medical History:  Diagnosis Date   Allergic rhinitis    Asthma    Breast cancer ([place]) 1987   Erysipelas    Hemorrhoids    Hypertension    Hypothyroidism     Past Surgical History:  Procedure Laterality Date   BREAST LUMPECTOMY Right 1986   malignant   TONSILLECTOMY       Current Outpatient Medications  Medication Sig Dispense Refill   aspirin 81 MG tablet Take 2 tablets by mouth once daily     Cholecalciferol (VITAMIN D3) 25 MCG (1000 UT) CAPS Take by mouth daily.     COVID-19 mRNA Vac-TriS, Pfizer, SUSP injection Inject into the muscle. 0.3 mL 0   levothyroxine (SYNTHROID) 100 MCG tablet Take 1 tablet (100 mcg  total) by mouth daily. 90 tablet 3   loratadine (CLARITIN) 10 MG tablet Take 10 mg by mouth daily.     Magnesium 400 MG CAPS Take by mouth.     Multiple Vitamin (MULTIVITAMIN PO) Take 1 tablet by mouth daily.     Omega-3 Fatty Acids (FISH OIL) 1000 MG CAPS Take 1 capsule by mouth daily.     solifenacin (VESICARE) 10 MG tablet Take 1 tablet (10 mg total) by mouth daily. 90 tablet 3   valsartan-hydrochlorothiazide (DIOVAN-HCT) 320-25 MG tablet Take 1 tablet by mouth daily. 90 tablet 3   augmented betamethasone dipropionate (DIPROLENE-AF) 0.05 % cream Apply topically. 1-2 times per day (Patient not taking: Reported on 12/07/2020)     B Complex Vitamins (VITAMIN B COMPLEX PO) Take by mouth daily. (Patient not taking: Reported on 12/07/2020)     hydrALAZINE (APRESOLINE) 50 MG tablet Take 1 tablet (50 mg total) by mouth 2 (two) times daily. 180 tablet 3   No current facility-administered medications for this visit.    Allergies:   Penicillins    ROS:  Please see the history of present illness.   Otherwise, review of systems are positive for none.  All other systems are reviewed and negative.    PHYSICAL EXAM: VS:  BP (!) 146/74   Pulse 65   Ht '5\' 8"'$  (1.727 m)   Wt 177 lb 3.2 oz (80.4 kg)   SpO2 96%   BMI 26.94 kg/m  , BMI Body mass index is 26.94 kg/m. GENERAL:  Well appearing NECK:  No jugular venous distention, waveform within normal limits, carotid upstroke brisk and symmetric, loud right-sided to and fro bruits, no thyromegaly LUNGS:  Clear to auscultation bilaterally CHEST:  Unremarkable HEART:  PMI not displaced or sustained,S1 and S2 within normal limits, no S3, no S4, no clicks, no rubs, no murmurs ABD:  Flat, positive bowel sounds normal in frequency in pitch, no bruits, no rebound, no guarding, no midline pulsatile mass, no hepatomegaly, no splenomegaly EXT:  2 plus pulses throughout, no edema, no cyanosis no clubbing   EKG:  EKG is  ordered today. Sinus rhythm, rate 65,  left axis deviation, interventricular conduction delay, no change from previous, somewhat atypical right bundle branch block   Recent Labs: 09/25/2020: ALT 14; BUN 23; Creatinine, Ser 1.09; Hemoglobin 13.5; Platelets 180.0; Potassium 4.0; Sodium 138; TSH 3.21    Lipid Panel    Component Value Date/Time   CHOL 168 09/25/2020 1159   TRIG 79.0 09/25/2020 1159   HDL 49.00 09/25/2020 1159   CHOLHDL 3 09/25/2020 1159   VLDL 15.8 09/25/2020 1159   LDLCALC 103 (H) 09/25/2020 1159      Wt Readings from Last 3 Encounters:  12/07/20 177 lb 3.2 oz (80.4 kg)  09/25/20 178 lb (80.7 kg)  05/12/20 181 lb (82.1 kg)      Other studies Reviewed: Additional studies/ records that were reviewed today include: Labs Review of the above records demonstrates:  Please see elsewhere in the note.     ASSESSMENT AND PLAN:  DIZZINESS:    The etiology of this is not clear.  At this point no change in therapy is indicated.   BRUIT: Given her continued dizziness and this bruit I would like to send her to see vascular surgery.  She has had images of her head and carotid Doppler but she has never had any vascular studies of her neck and arm.  These might be indicated.     Current medicines are reviewed at length with the patient today.  The patient does not have concerns regarding medicines.  The following changes have been made:  None  Labs/ tests ordered today include: None  Orders Placed This Encounter  Procedures   Ambulatory referral to Vascular Surgery   EKG 12-Lead      Disposition:   FU with me in about 18 months  Signed, Minus Breeding, MD  12/07/2020 1:42 PM    [place]

## 2020-12-07 ENCOUNTER — Ambulatory Visit (INDEPENDENT_AMBULATORY_CARE_PROVIDER_SITE_OTHER): Payer: Medicare Other | Admitting: Cardiology

## 2020-12-07 ENCOUNTER — Other Ambulatory Visit: Payer: Self-pay

## 2020-12-07 ENCOUNTER — Encounter: Payer: Self-pay | Admitting: Cardiology

## 2020-12-07 VITALS — BP 146/74 | HR 65 | Ht 68.0 in | Wt 177.2 lb

## 2020-12-07 DIAGNOSIS — R42 Dizziness and giddiness: Secondary | ICD-10-CM | POA: Diagnosis not present

## 2020-12-07 DIAGNOSIS — R0602 Shortness of breath: Secondary | ICD-10-CM

## 2020-12-07 DIAGNOSIS — R0989 Other specified symptoms and signs involving the circulatory and respiratory systems: Secondary | ICD-10-CM

## 2020-12-07 MED ORDER — HYDRALAZINE HCL 50 MG PO TABS: 50.0000 mg | ORAL_TABLET | Freq: Two times a day (BID) | ORAL | 3 refills | Status: DC

## 2020-12-07 NOTE — Patient Instructions (Signed)
Medication Instructions:  Your physician recommends that you continue on your current medications as directed. Please refer to the Current Medication list given to you today.  *If you need a refill on your cardiac medications before your next appointment, please call your pharmacy*  Follow-Up: At [facility], you and your health needs are our priority.  As part of our continuing mission to provide you with exceptional heart care, we have created designated Provider Care Teams.  These Care Teams include your primary Cardiologist (physician) and Advanced Practice Providers (APPs -  Physician Assistants and Nurse Practitioners) who all work together to provide you with the care you need, when you need it.  We recommend signing up for the patient portal called "MyChart".  Sign up information is provided on this After Visit Summary.  MyChart is used to connect with patients for Virtual Visits (Telemedicine).  Patients are able to view lab/test results, encounter notes, upcoming appointments, etc.  Non-urgent messages can be sent to your provider as well.   To learn more about what you can do with MyChart, go to [email or web address].    Your next appointment:   18 month(s)  The format for your next appointment:   In Person  Provider:   You may see Dr. Percival Spanish or one of the following Advanced Practice Providers on your designated Care Team:   Rosaria Ferries, PA-C Caron Presume, PA-C Jory Sims, DNP, ANP   Other Instructions You have been referred to [facility] [address] [place],  [place]  [address]  Main: [phone]

## 2021-01-01 ENCOUNTER — Other Ambulatory Visit: Payer: Self-pay | Admitting: *Deleted

## 2021-01-01 ENCOUNTER — Other Ambulatory Visit: Payer: Self-pay

## 2021-01-01 ENCOUNTER — Ambulatory Visit (INDEPENDENT_AMBULATORY_CARE_PROVIDER_SITE_OTHER): Payer: Medicare Other | Admitting: Internal Medicine

## 2021-01-01 ENCOUNTER — Encounter: Payer: Self-pay | Admitting: Internal Medicine

## 2021-01-01 VITALS — BP 158/80 | HR 62 | Temp 98.8°F | Ht 68.0 in | Wt 178.0 lb

## 2021-01-01 DIAGNOSIS — R0989 Other specified symptoms and signs involving the circulatory and respiratory systems: Secondary | ICD-10-CM

## 2021-01-01 DIAGNOSIS — E039 Hypothyroidism, unspecified: Secondary | ICD-10-CM

## 2021-01-01 DIAGNOSIS — Z23 Encounter for immunization: Secondary | ICD-10-CM | POA: Diagnosis not present

## 2021-01-01 DIAGNOSIS — J4520 Mild intermittent asthma, uncomplicated: Secondary | ICD-10-CM | POA: Diagnosis not present

## 2021-01-01 DIAGNOSIS — I10 Essential (primary) hypertension: Secondary | ICD-10-CM | POA: Diagnosis not present

## 2021-01-01 DIAGNOSIS — Z0189 Encounter for other specified special examinations: Secondary | ICD-10-CM

## 2021-01-01 NOTE — Progress Notes (Signed)
Patient ID: Julia Norton, female   DOB: [birth year], 85 y.o.   MRN: [id number]        Chief Complaint: follow up HTN, asthma, hypothyroidism, and competancy letter       HPI:  Julia Norton is a 85 y.o. female  here with husband, oveall doing well;  Pt denies chest pain, increased sob or doe, wheezing, orthopnea, PND, increased LE swelling, palpitations, dizziness or syncope.   Pt denies polydipsia, polyuria, or new focal neuro s/s.  Remains alert and oriented x 3 with intact ST memory and judgement.   Pt denies fever, wt loss, night sweats, loss of appetite, or other constitutional symptoms   pt states BP at home < 140/90.  Denies hyper or hypo thyroid symptoms such as voice, skin or hair change.          Wt Readings from Last 3 Encounters:  01/01/21 178 lb (80.7 kg)  12/07/20 177 lb 3.2 oz (80.4 kg)  09/25/20 178 lb (80.7 kg)   BP Readings from Last 3 Encounters:  01/01/21 (!) 158/80  12/07/20 (!) 146/74  09/25/20 128/80         Past Medical History:  Diagnosis Date   Allergic rhinitis    Asthma    Breast cancer ([place]) 1987   Erysipelas    Hemorrhoids    Hypertension    Hypothyroidism    Past Surgical History:  Procedure Laterality Date   BREAST LUMPECTOMY Right 1986   malignant   TONSILLECTOMY      reports that she quit smoking about 59 years ago. Her smoking use included cigarettes. She has never used smokeless tobacco. She reports that she does not drink alcohol and does not use drugs. family history includes Arthritis in her father, mother, and sister; COPD in her sister; Celiac disease in her sister; Heart disease in her mother; Heart disease (age of onset: 53) in her father; Heart disease (age of onset: 12) in her sister; Hypertension in her mother and sister. Allergies  Allergen Reactions   Penicillins    Current Outpatient Medications on File Prior to Visit  Medication Sig Dispense Refill   aspirin 81 MG tablet Take 2 tablets by mouth once daily      Cholecalciferol (VITAMIN D3) 25 MCG (1000 UT) CAPS Take by mouth daily.     hydrALAZINE (APRESOLINE) 50 MG tablet Take 1 tablet (50 mg total) by mouth 2 (two) times daily. 180 tablet 3   levothyroxine (SYNTHROID) 100 MCG tablet Take 1 tablet (100 mcg total) by mouth daily. 90 tablet 3   loratadine (CLARITIN) 10 MG tablet Take 10 mg by mouth daily.     Magnesium 400 MG CAPS Take by mouth.     Multiple Vitamin (MULTIVITAMIN PO) Take 1 tablet by mouth daily.     Omega-3 Fatty Acids (FISH OIL) 1000 MG CAPS Take 1 capsule by mouth daily.     solifenacin (VESICARE) 10 MG tablet Take 1 tablet (10 mg total) by mouth daily. 90 tablet 3   valsartan-hydrochlorothiazide (DIOVAN-HCT) 320-25 MG tablet Take 1 tablet by mouth daily. 90 tablet 3   augmented betamethasone dipropionate (DIPROLENE-AF) 0.05 % cream Apply topically. 1-2 times per day (Patient not taking: No sig reported)     No current facility-administered medications on file prior to visit.        ROS:  All others reviewed and negative.  Objective        PE:  BP (!) 158/80 (BP Location: Right Arm, Patient  Position: Sitting, Cuff Size: Normal)   Pulse 62   Temp 98.8 F (37.1 C) (Oral)   Ht '5\' 8"'$  (1.727 m)   Wt 178 lb (80.7 kg)   SpO2 96%   BMI 27.06 kg/m                 Constitutional: Pt appears in NAD               HENT: Head: NCAT.                Right Ear: External ear normal.                 Left Ear: External ear normal.                Eyes: . Pupils are equal, round, and reactive to light. Conjunctivae and EOM are normal               Nose: without d/c or deformity               Neck: Neck supple. Gross normal ROM               Cardiovascular: Normal rate and regular rhythm.                 Pulmonary/Chest: Effort normal and breath sounds without rales or wheezing.                Abd:  Soft, NT, ND, + BS, no organomegaly               Neurological: Pt is alert. At baseline orientation, motor grossly intact               Skin:  Skin is warm. No rashes, no other new lesions, LE edema - none               Psychiatric: Pt behavior is normal without agitation   Micro: none  Cardiac tracings I have personally interpreted today:  none  Pertinent Radiological findings (summarize): none   Lab Results  Component Value Date   WBC 7.1 09/25/2020   HGB 13.5 09/25/2020   HCT 39.0 09/25/2020   PLT 180.0 09/25/2020   GLUCOSE 93 09/25/2020   CHOL 168 09/25/2020   TRIG 79.0 09/25/2020   HDL 49.00 09/25/2020   LDLCALC 103 (H) 09/25/2020   ALT 14 09/25/2020   AST 20 09/25/2020   NA 138 09/25/2020   K 4.0 09/25/2020   CL 100 09/25/2020   CREATININE 1.09 09/25/2020   BUN 23 09/25/2020   CO2 29 09/25/2020   TSH 3.21 09/25/2020   HGBA1C 5.2 09/25/2020   Assessment/Plan:  Julia Norton is a 85 y.o. White or Caucasian [1] female with  has a past medical history of Allergic rhinitis, Asthma, Breast cancer ([place]) (1987), Erysipelas, Hemorrhoids, Hypertension, and Hypothyroidism.  Hypothyroidism Lab Results  Component Value Date   TSH 3.21 09/25/2020   Stable, pt to continue levothyroxine   Hypertension BP Readings from Last 3 Encounters:  01/01/21 (!) 158/80  12/07/20 (!) 146/74  09/25/20 128/80   Uncontrolled here, but pt states controlled at home, pt to continue medical treatment diovan hct, hydralazine as declines change   Encounter for competency evaluation Pt found competent for handling of all personal financial and healthcare concerns  Asthma stalbe at this time, does not require inhaler per pt  Followup: Return in about 6 months (around 07/01/2021).  Julia Cower, MD 01/09/2021 3:04 PM [facility]

## 2021-01-01 NOTE — Patient Instructions (Addendum)
You had the flu shot today  Please continue to monitor your BP at home once per day for 10 days, and call with the average, with the goal being to be less than 140/90 at minimum.  You are given the letter today  Please continue all other medications as before, and refills have been done if requested.  Please have the pharmacy call with any other refills you may need.  Please continue your efforts at being more active, low cholesterol diet, and weight control.  Please keep your appointments with your specialists as you may have planned

## 2021-01-09 ENCOUNTER — Encounter: Payer: Self-pay | Admitting: Internal Medicine

## 2021-01-09 DIAGNOSIS — Z0189 Encounter for other specified special examinations: Secondary | ICD-10-CM | POA: Insufficient documentation

## 2021-01-09 NOTE — Assessment & Plan Note (Signed)
Lab Results  Component Value Date   TSH 3.21 09/25/2020   Stable, pt to continue levothyroxine

## 2021-01-09 NOTE — Assessment & Plan Note (Signed)
BP Readings from Last 3 Encounters:  01/01/21 (!) 158/80  12/07/20 (!) 146/74  09/25/20 128/80   Uncontrolled here, but pt states controlled at home, pt to continue medical treatment diovan hct, hydralazine as declines change

## 2021-01-09 NOTE — Assessment & Plan Note (Signed)
Pt found competent for handling of all personal financial and healthcare concerns

## 2021-01-09 NOTE — Assessment & Plan Note (Signed)
stalbe at this time, does not require inhaler per pt

## 2021-01-11 ENCOUNTER — Encounter: Payer: Self-pay | Admitting: Surgery

## 2021-01-11 ENCOUNTER — Ambulatory Visit (HOSPITAL_COMMUNITY)
Admission: RE | Admit: 2021-01-11 | Discharge: 2021-01-11 | Disposition: A | Discharge status: Home / Self Care | Payer: Medicare Other | Source: Ambulatory Visit | Attending: Surgery | Admitting: Surgery

## 2021-01-11 ENCOUNTER — Ambulatory Visit (INDEPENDENT_AMBULATORY_CARE_PROVIDER_SITE_OTHER): Payer: Medicare Other | Admitting: Surgery

## 2021-01-11 ENCOUNTER — Other Ambulatory Visit: Payer: Self-pay

## 2021-01-11 VITALS — BP 151/77 | HR 67 | Temp 98.0°F | Resp 20 | Ht 68.0 in | Wt 176.0 lb

## 2021-01-11 DIAGNOSIS — R0989 Other specified symptoms and signs involving the circulatory and respiratory systems: Secondary | ICD-10-CM | POA: Insufficient documentation

## 2021-01-11 DIAGNOSIS — R42 Dizziness and giddiness: Secondary | ICD-10-CM | POA: Diagnosis not present

## 2021-01-11 NOTE — Progress Notes (Signed)
[facility]  Patient name: Julia Norton MRN: [id number] DOB: 06/14/1935 Sex: female   REQUESTING PROVIDER:    Dr. Percival Spanish   REASON FOR CONSULT:    Carotid bruit  HISTORY OF PRESENT ILLNESS:   Julia Norton is a 85 y.o. female, who is referred for evaluation of a carotid bruit.  Patient has a history of dizziness which began in March 2021.  At that time she thought she had a stroke because her legs felt like jelly and she was weak all over.  Her work-up did not reveal a stroke.  She has also undergone additional imaging including Holter monitor, MRI, CT scan, all of which have been unremarkable.  She does have a history of vertigo approximately 20 years ago but has not had issues since that time.  She is not orthostatic.  She is a former smoker.  She is medically managed for hypertension.  PAST MEDICAL HISTORY    Past Medical History:  Diagnosis Date   Allergic rhinitis    Asthma    Breast cancer ([place]) 1987   Erysipelas    Hemorrhoids    Hypertension    Hypothyroidism      FAMILY HISTORY   Family History  Problem Relation Age of Onset   Celiac disease Sister    Heart disease Sister 3       heart attack, died 49   Arthritis Sister    COPD Sister    Hypertension Sister    Heart disease Mother        CHF   Arthritis Mother    Hypertension Mother    Heart disease Father 19       heart attack   Arthritis Father     SOCIAL HISTORY:   Social History   Socioeconomic History   Marital status: Married    Spouse name: Not on file   Number of children: 3   Years of education: Not on file   Highest education level: High school graduate  Occupational History   Not on file  Tobacco Use   Smoking status: Former    Types: Cigarettes    Quit date: 04/18/1961    Years since quitting: 59.7   Smokeless tobacco: Never  Vaping Use   Vaping Use: Never used  Substance and Sexual Activity    Alcohol use: Never   Drug use: Never   Sexual activity: Not Currently  Other Topics Concern   Not on file  Social History Narrative   Lives with husband.  She has three children and 7 grands and one great.       Caffeine: very little (chocolate), drinks decaf coffee   Social Determinants of Health   Financial Resource Strain: Not on file  Food Insecurity: Not on file  Transportation Needs: Not on file  Physical Activity: Not on file  Stress: Not on file  Social Connections: Not on file  Intimate Partner Violence: Not on file    ALLERGIES:    Allergies  Allergen Reactions   Penicillins     CURRENT MEDICATIONS:    Current Outpatient Medications  Medication Sig Dispense Refill   aspirin 81 MG tablet Take 2 tablets by mouth once daily     Cholecalciferol (VITAMIN D3) 25 MCG (1000 UT) CAPS Take by mouth daily.     hydrALAZINE (APRESOLINE) 50 MG tablet Take 1 tablet (50 mg total) by mouth 2 (two) times daily. 180 tablet 3   levothyroxine (SYNTHROID) 100 MCG  tablet Take 1 tablet (100 mcg total) by mouth daily. 90 tablet 3   loratadine (CLARITIN) 10 MG tablet Take 10 mg by mouth daily.     Magnesium 400 MG CAPS Take by mouth.     Multiple Vitamin (MULTIVITAMIN PO) Take 1 tablet by mouth daily.     Omega-3 Fatty Acids (FISH OIL) 1000 MG CAPS Take 1 capsule by mouth daily.     solifenacin (VESICARE) 10 MG tablet Take 1 tablet (10 mg total) by mouth daily. 90 tablet 3   valsartan-hydrochlorothiazide (DIOVAN-HCT) 320-25 MG tablet Take 1 tablet by mouth daily. 90 tablet 3   augmented betamethasone dipropionate (DIPROLENE-AF) 0.05 % cream Apply topically. 1-2 times per day (Patient not taking: No sig reported)     No current facility-administered medications for this visit.    REVIEW OF SYSTEMS:   [X]  denotes positive finding, [ ]  denotes negative finding Cardiac  Comments:  Chest pain or chest pressure:    Shortness of breath upon exertion:    Short of breath when lying  flat:    Irregular heart rhythm:        Vascular    Pain in calf, thigh, or hip brought on by ambulation:    Pain in feet at night that wakes you up from your sleep:     Blood clot in your veins:    Leg swelling:  x       Pulmonary    Oxygen at home:    Productive cough:     Wheezing:         Neurologic    Sudden weakness in arms or legs:     Sudden numbness in arms or legs:     Sudden onset of difficulty speaking or slurred speech:    Temporary loss of vision in one eye:     Problems with dizziness:  x       Gastrointestinal    Blood in stool:      Vomited blood:         Genitourinary    Burning when urinating:     Blood in urine:        Psychiatric    Major depression:         Hematologic    Bleeding problems:    Problems with blood clotting too easily:        Skin    Rashes or ulcers:        Constitutional    Fever or chills:     PHYSICAL EXAM:   Vitals:   01/11/21 1022  BP: 133/67  Pulse: 67  Resp: 20  Temp: 98 F (36.7 C)  SpO2: 95%  Weight: 176 lb (79.8 kg)  Height: 5\' 8"  (1.727 m)    GENERAL: The patient is a well-nourished female, in no acute distress. The vital signs are documented above. CARDIAC: There is a regular rate and rhythm.  VASCULAR: 1-2+ bilateral lower extremity pitting edema PULMONARY: Nonlabored respirations ABDOMEN: Soft and non-tender.  No pulsatile mass MUSCULOSKELETAL: There are no major deformities or cyanosis. NEUROLOGIC: No focal weakness or paresthesias are detected. SKIN: There are no ulcers or rashes noted. PSYCHIATRIC: The patient has a normal affect.  STUDIES:   I have reviewed the following studies: Right Carotid: Velocities in the right ICA are consistent with a 1-39%  stenosis.   Left Carotid: Velocities in the left ICA are consistent with a 1-39%  stenosis.   Vertebrals:  Bilateral vertebral arteries demonstrate antegrade flow.  Subclavians: Normal flow hemodynamics were seen in bilateral subclavian                arteries.   ASSESSMENT and PLAN   Dizziness: I discussed with the patient that her carotid Doppler studies today were essentially normal.  She has normal vertebral waveforms as well and palpable radial pulses.  I do not think that her dizziness is related to her carotid artery.  The bruit that was heard is likely secondary to tortuosity of the vessel.  I have recommended that she follow-up again with Dr. Jaynee Eagles for further management of her dizziness.   Leia Alf, MD, FACS [facility] [phone] Pager [phone]

## 2021-02-23 DIAGNOSIS — R2689 Other abnormalities of gait and mobility: Secondary | ICD-10-CM | POA: Insufficient documentation

## 2021-03-23 ENCOUNTER — Telehealth: Payer: Self-pay | Admitting: Neurology

## 2021-03-23 DIAGNOSIS — H8110 Benign paroxysmal vertigo, unspecified ear: Secondary | ICD-10-CM

## 2021-03-23 NOTE — Telephone Encounter (Signed)
Pt called, having same problem; dizziness and balance. Reach out to [facility] rehabilitation instructed to call back and let physician know that I need referral for rehabilitation. Would like a call from the nurse.

## 2021-03-23 NOTE — Telephone Encounter (Signed)
Spoke with pt. She stated her vertigo never got completely better but has gotten worse.  She is very thankful for the referral for another round of therapy.  I advised her to give them a call rather than wait for a call as it could take a couple weeks.   Order placed again for vestibular therapy w/ PT.

## 2021-03-26 ENCOUNTER — Ambulatory Visit: Payer: Medicare Other | Attending: Neurology | Admitting: Physical Therapy

## 2021-03-26 ENCOUNTER — Encounter: Payer: Self-pay | Admitting: Physical Therapy

## 2021-03-26 ENCOUNTER — Other Ambulatory Visit: Payer: Self-pay

## 2021-03-26 DIAGNOSIS — M542 Cervicalgia: Secondary | ICD-10-CM | POA: Diagnosis present

## 2021-03-26 DIAGNOSIS — R262 Difficulty in walking, not elsewhere classified: Secondary | ICD-10-CM

## 2021-03-26 DIAGNOSIS — R296 Repeated falls: Secondary | ICD-10-CM | POA: Diagnosis present

## 2021-03-26 DIAGNOSIS — H8110 Benign paroxysmal vertigo, unspecified ear: Secondary | ICD-10-CM | POA: Insufficient documentation

## 2021-03-26 DIAGNOSIS — R42 Dizziness and giddiness: Secondary | ICD-10-CM

## 2021-03-26 DIAGNOSIS — R2681 Unsteadiness on feet: Secondary | ICD-10-CM

## 2021-03-26 NOTE — Therapy (Signed)
[facility] [address] [place], [place], [address] Phone: [phone]   Fax:  [phone]  Physical Therapy Evaluation  Patient Details  Name: Julia Norton MRN: [id number] Date of Birth: July 14, 1935 Referring Provider (PT): Melvenia Beam, MD   Encounter Date: 03/26/2021   PT End of Session - 03/26/21 1249     Visit Number 1    Number of Visits 17    Date for PT Re-Evaluation 05/25/21    Authorization Type UHC    Progress Note Due on Visit 10    PT Start Time 1150    PT Stop Time 1235    PT Time Calculation (min) 45 min    Equipment Utilized During Treatment Gait belt    Activity Tolerance Patient tolerated treatment well    Behavior During Therapy WFL for tasks assessed/performed             Past Medical History:  Diagnosis Date   Allergic rhinitis    Asthma    Breast cancer ([place]) 1987   Erysipelas    Hemorrhoids    Hypertension    Hypothyroidism     Past Surgical History:  Procedure Laterality Date   BREAST LUMPECTOMY Right 1986   malignant   TONSILLECTOMY      There were no vitals filed for this visit.    Subjective Assessment - 03/26/21 1157     Subjective Since D/C from vestibular rehab pt saw cardiology, vascular surgery and ENT.  No significant findings, referred back to neurology and referred back to vestibular rehab.  Pt reports when she D/C from therapy it was better but still there; lately over the past couple of weeks it has gotten worse.  Some days she can barely stand up.  Feels it when she first gets up and keeps her cane by her bed; still can fall over into the wall even with cane.  3 falls in the past 6 months; had one fall where she fell backwards after bending down to the floor, fell into vacuum and hit her head on a wooden table.  Another happened when moving something heavy backwards and was backing up.  On bad days she can go any direction.  No room spinning.  Having a  bad day almost every other day; has calmed down this week.  Tries to avoid looking up into tall cabinets.  Doesn't feel secure stepping up.  Has been having increased neck pain, especially on R.  Goes to see orthopedic MD on 12/20 about the neck.    Pertinent History Allergies, asthma, breast CA with lumpectomy, HTN, hypothyroidism, h/o BPPV    Limitations Standing;Walking    Patient Stated Goals To be back to normal; gain confidence to move around more.    Currently in Pain? Yes    Pain Location Neck    Pain Orientation Right;Left    Pain Descriptors / Indicators Tightness    Pain Type Acute pain                [facility] PT Assessment - 03/26/21 1208       Assessment   Medical Diagnosis BPPV    Referring Provider (PT) Melvenia Beam, MD    Onset Date/Surgical Date 03/23/21    Next MD Visit 12/20 orthopedics    Prior Therapy yes for dizziness      Precautions   Precautions Other (comment)    Precaution Comments Allergies, asthma, breast CA with lumpectomy, HTN, hypothyroidism, h/o BPPV  Balance Screen   Has the patient fallen in the past 6 months Yes    How many times? 3    Has the patient had a decrease in activity level because of a fear of falling?  Yes      [place] residence    Additional Comments Now using cane indoors and outdoors all the time; still driving      Prior Function   Level of Independence Independent      Observation/Other Assessments   Focus on Therapeutic Outcomes (FOTO)  DPS: 45; DFS: 48      Sensation   Light Touch Appears Intact      Coordination   Gross Motor Movements are Fluid and Coordinated Yes    Finger Nose Finger Test [facility]    Heel Shin Test [facility]      Posture/Postural Control   Posture/Postural Control Postural limitations    Postural Limitations Rounded Shoulders;Forward head;Increased thoracic kyphosis;Posterior pelvic tilt      ROM / Strength   AROM / PROM / Strength Strength;AROM       AROM   Overall AROM  Deficits    AROM Assessment Site Cervical    Cervical Flexion 50    Cervical Extension 35   pain in suboccipital region   Cervical - Right Side Bend 25    Cervical - Left Side Bend 20    Cervical - Right Rotation 45    Cervical - Left Rotation 55      Strength   Overall Strength Deficits    Overall Strength Comments Hip flexion 3+/5 bilaterally, knee extension 4/5, knee flexion 4+/5, hip ABD/ADD 4/5, ankle DF 4/5      Ambulation/Gait   Ambulation/Gait Yes    Ambulation/Gait Assistance 4: Min assist    Ambulation/Gait Assistance Details With cane, pt requires supervision; without cane pt requires min A due to imbalance, veering and LE crossing over one another    Ambulation Distance (Feet) 230 Feet    Assistive device None    Gait Pattern Step-through pattern;Decreased stride length;Lateral hip instability    Ambulation Surface Level;Indoor    Stairs Yes    Stairs Assistance 5: Supervision    Stairs Assistance Details (indicate cue type and reason) alternating to ascend, step to descending    Stair Management Technique Two rails;Alternating pattern;Step to pattern;Forwards    Number of Stairs 4    Height of Stairs 6      Standardized Balance Assessment   Standardized Balance Assessment Five Times Sit to Stand;Dynamic Gait Index    Five times sit to stand comments  21.6 seconds from chair with use of UE; COG shifted backwards, one episode of LOB forwards.      Dynamic Gait Index   Level Surface Moderate Impairment    Change in Gait Speed Moderate Impairment    Gait with Horizontal Head Turns Mild Impairment    Gait with Vertical Head Turns Mild Impairment    Gait and Pivot Turn Mild Impairment    Step Over Obstacle Moderate Impairment    Step Around Obstacles Mild Impairment    Steps Moderate Impairment    Total Score 12    DGI comment: 12/24 (was 22/24 in March 2022)                        Objective measurements completed on  examination: See above findings.  PT Education - 03/26/21 1249     Education Details clinical findings and regression of balance compared to March; PT POC and goals    Person(s) Educated Patient    Methods Explanation    Comprehension Verbalized understanding              PT Short Term Goals - 03/26/21 1258       PT SHORT TERM GOAL #1   Title Pt will participate in more in depth vestibular re-assessment    Time 4    Period Weeks    Status New    Target Date 04/25/21      PT SHORT TERM GOAL #2   Title Pt will initiate HEP focusing on LE strength, balance and neck ROM    Time 4    Period Weeks    Status New    Target Date 04/25/21      PT SHORT TERM GOAL #3   Title Pt will demonstrate decreased falls risk during transitions as indicated by decrease in five time sit to stand to </= 17 seconds demonstrating ability to shift COG fully over BOS without UE support    Baseline 21 seconds with UE touching arm rests through out sit <> stand    Time 4    Period Weeks    Status New    Target Date 04/25/21      PT SHORT TERM GOAL #4   Title Pt will demonstrate decreased falls risk during ambulation as indicated by increase in DGI to >/= 16/24    Baseline 12/24    Time 4    Period Weeks    Status New    Target Date 04/25/21               PT Long Term Goals - 03/26/21 1300       PT LONG TERM GOAL #1   Title Pt will report improvement in DPS to >/= 58 and increase in DFS to >/= 52    Baseline DPS: 45; DFS: 48    Time 8    Period Weeks    Status New    Target Date 05/25/21      PT LONG TERM GOAL #2   Title Pt will demonstrate independence with final HEP    Time 8    Period Weeks    Status New    Target Date 05/25/21      PT LONG TERM GOAL #3   Title Pt will demonstrate improvement in DGI to >/= 20/24    Time 8    Period Weeks    Status New    Target Date 05/25/21      PT LONG TERM GOAL #4   Title Pt will report no symptoms of  dizziness during sit > stand, bending forwards, performing head/body turns and tilting head back    Time 8    Period Weeks    Status New    Target Date 05/25/21      PT LONG TERM GOAL #5   Title Pt will decrease five time sit to stand to </= 17 seconds without the use of UE when performing sit > stand and stand > sit    Time 8    Period Weeks    Status New    Target Date 05/25/21      Additional Long Term Goals   Additional Long Term Goals Yes      PT LONG TERM GOAL #6   Title Pt will  demonstrate ability to perform neck extension without pain and will demonstrate 10 deg improvement in neck lateral flexion and rotation bilaterally with 50% decreased pain    Baseline see flowsheet    Time 8    Period Weeks    Status New    Target Date 05/25/21                    Plan - 03/26/21 1250     Clinical Impression Statement Pt is an 85 year old female well known to this clinician referred to Neuro OPPT for evaluation of recurrent dizziness and balance impairments.  Pt's PMH is significant for the following: Allergies, asthma, breast CA with lumpectomy, HTN, hypothyroidism, h/o BPPV. The following deficits were noted during pt's exam: impaired neck ROM and neck pain, disequilibrium, impaired LE strength, impaired static and dynamic standing balance, impaired balance reactions, and gait abnormalities.  Pt's DGI score indicates pt is at high risk for falls; DGI score has significantly declined from when she finished PT in March. Pt would benefit from skilled PT to address these impairments and functional limitations to maximize functional mobility independence and reduce falls risk.    Personal Factors and Comorbidities Comorbidity 3+;Fitness;Past/Current Experience    Comorbidities Allergies, asthma, breast CA with lumpectomy, HTN, hypothyroidism, h/o BPPV    Examination-Activity Limitations Bend;Locomotion Level;Reach Overhead;Stand    Examination-Participation Restrictions  Cleaning;Laundry;Meal Prep    Stability/Clinical Decision Making Evolving/Moderate complexity    Clinical Decision Making Moderate    Rehab Potential Good    PT Frequency 2x / week    PT Duration 8 weeks    PT Treatment/Interventions ADLs/Self Care Home Management;Aquatic Therapy;Canalith Repostioning;Cryotherapy;Moist Heat;DME Instruction;Gait training;Stair training;Functional mobility training;Therapeutic activities;Therapeutic exercise;Balance training;Neuromuscular re-education;Patient/family education;Manual techniques;Passive range of motion;Dry needling;Vestibular    PT Next Visit Plan More in depth vestibular reassessment; re start HEP focusing on standing balance, LE strengthening, gait without cane.  Check in after ortho appt on 12/20 about neck-incorporate neck treatment?    Consulted and Agree with Plan of Care Patient             Patient will benefit from skilled therapeutic intervention in order to improve the following deficits and impairments:  Abnormal gait, Decreased balance, Decreased strength, Difficulty walking, Dizziness, Postural dysfunction, Pain  Visit Diagnosis: Unsteadiness on feet  Difficulty in walking, not elsewhere classified  Dizziness and giddiness  Repeated falls     Problem List Patient Active Problem List   Diagnosis Date Noted   Encounter for competency evaluation 01/09/2021   SOB (shortness of breath) 12/06/2020   Benign paroxysmal positional vertigo 05/12/2020   Orthostatic hypotension 09/23/2019   Dizziness 09/17/2019   OAB (overactive bladder) 08/27/2019   Pain of left thumb 08/27/2019   Family history of factor V Leiden mutation 08/11/2019   Peripheral edema 08/11/2019   Encounter for well adult exam with abnormal findings 08/08/2019   Hypothyroidism    Hypertension    Asthma    Allergic rhinitis    hx: right breast cancer 11/25/2011    Rico Junker, PT, DPT 03/26/21    1:07 PM    [facility] [address] [place], [place], [address] Phone: [phone]   Fax:  [phone]  Name: CLARICE ZULAUF MRN: [id number] Date of Birth: 08-Nov-1935

## 2021-04-01 ENCOUNTER — Ambulatory Visit: Payer: Medicare Other | Admitting: Physical Therapy

## 2021-04-01 ENCOUNTER — Other Ambulatory Visit: Payer: Self-pay

## 2021-04-01 DIAGNOSIS — R42 Dizziness and giddiness: Secondary | ICD-10-CM

## 2021-04-01 DIAGNOSIS — R2681 Unsteadiness on feet: Secondary | ICD-10-CM | POA: Diagnosis not present

## 2021-04-01 DIAGNOSIS — R296 Repeated falls: Secondary | ICD-10-CM

## 2021-04-01 DIAGNOSIS — R262 Difficulty in walking, not elsewhere classified: Secondary | ICD-10-CM

## 2021-04-01 NOTE — Patient Instructions (Signed)
Access Code: [email or web address] URL: [email or web address] Date: 04/01/2021 Prepared by: Misty Stanley  Exercises Heel Toe Raises with Unilateral Counter Support - 1 x daily - 7 x weekly - 2 sets - 10 reps Sit to Stand with Immediate Step - 1 x daily - 7 x weekly - 3-5 sets Romberg Stance on Foam Pad with Head Rotation - 1 x daily - 7 x weekly - 1 sets - 10 reps Feet Together Balance with Head Nods on Foam Pad - 1 x daily - 7 x weekly - 1 sets - 10 reps

## 2021-04-01 NOTE — Therapy (Signed)
[facility] [address] [place], [place], [address] Phone: [phone]   Fax:  [phone]  Physical Therapy Treatment  Patient Details  Name: Julia Norton MRN: [id number] Date of Birth: December 26, 1935 Referring Provider (PT): Melvenia Beam, MD   Encounter Date: 04/01/2021   PT End of Session - 04/01/21 1627     Visit Number 2    Number of Visits 17    Date for PT Re-Evaluation 05/25/21    Authorization Type UHC    Progress Note Due on Visit 10    PT Start Time 1450    PT Stop Time 1530    PT Time Calculation (min) 40 min    Activity Tolerance Patient tolerated treatment well    Behavior During Therapy [facility] for tasks assessed/performed             Past Medical History:  Diagnosis Date   Allergic rhinitis    Asthma    Breast cancer ([place]) 1987   Erysipelas    Hemorrhoids    Hypertension    Hypothyroidism     Past Surgical History:  Procedure Laterality Date   BREAST LUMPECTOMY Right 1986   malignant   TONSILLECTOMY      There were no vitals filed for this visit.   Subjective Assessment - 04/01/21 1452     Subjective No falls; yesterday was not a good day but the day before was.  Today is "not as good as I would like it."  Neck is not as bad as it was last visit; has been taking Aleve 2x/day and seems to be helping; not waking her up anymore.  Goes to orthopedic on Tuesday.    Pertinent History Allergies, asthma, breast CA with lumpectomy, HTN, hypothyroidism, h/o BPPV    Limitations Standing;Walking    Patient Stated Goals To be back to normal; gain confidence to move around more.    Currently in Pain? Yes                     Vestibular Assessment - 04/01/21 1456       Vestibular Assessment   General Observation Lost vision in R eye, has partially returned, blurry in R eye.  Doesn't see the black dot in her eye anymore      Symptom Behavior   Type of Dizziness   Imbalance;"Funny feeling in head"    Frequency of Dizziness Constant but less severe when sitting    Duration of Dizziness constant    Symptom Nature Constant    Aggravating Factors Turning head quickly;Forward bending;Supine to sit;Turning body quickly;Sitting with head tilted back;Sit to stand;Looking up to the ceiling    Relieving Factors Comments;Lying supine   sitting   Progression of Symptoms Worse      Oculomotor Exam   Oculomotor Alignment Normal    Spontaneous Absent    Gaze-induced  Absent    Smooth Pursuits Intact    Saccades Hypometric;Undershoots      Oculomotor Exam-Fixation Suppressed    Left Head Impulse unable to test; pt guarded    Right Head Impulse unable to test; pt guarded      Vestibulo-Ocular Reflex   VOR to Slow Head Movement Normal    VOR Cancellation Normal      Positional Testing   Dix-Hallpike Dix-Hallpike Right;Dix-Hallpike Left    Horizontal Canal Testing Horizontal Canal Right;Horizontal Canal Left      Dix-Hallpike Right   Dix-Hallpike Right Duration 0  Dix-Hallpike Right Symptoms No nystagmus      Dix-Hallpike Left   Dix-Hallpike Left Duration 0    Dix-Hallpike Left Symptoms No nystagmus      Horizontal Canal Right   Horizontal Canal Right Duration 0    Horizontal Canal Right Symptoms Normal      Horizontal Canal Left   Horizontal Canal Left Duration 0    Horizontal Canal Left Symptoms Normal      Positional Sensitivities   Sit to Supine No dizziness    Supine to Left Side No dizziness    Supine to Right Side No dizziness    Supine to Sitting Lightheadedness    Right Hallpike No dizziness    Up from Right Hallpike No dizziness    Up from Left Hallpike Lightheadedness    Nose to Right Knee No dizziness    Right Knee to Sitting Lightedness    Nose to Left Knee No dizziness    Left Knee to Sitting Lightheadedness    Head Turning x 5 No dizziness    Head Nodding x 5 Lightheadedness    Pivot Right in Standing Lightheadedness     Pivot Left in Standing No dizziness    Rolling Right No dizziness    Rolling Left No dizziness             Re-initiated and revised patient's vestibular and balance HEP.  Reviewed the following exercises with pt return demonstrating each exercise safely:  Access Code: [email or web address] URL: [email or web address] Date: 04/01/2021 Prepared by: Misty Stanley  Exercises Heel Toe Raises with Unilateral Counter Support - 1 x daily - 7 x weekly - 2 sets - 10 reps - advised pt to perform with EC for increased challenge Sit to Stand with Immediate Step - 1 x daily - 7 x weekly - 3-5 sets - alternating LE with each sit > stand Romberg Stance on Foam Pad with Head Rotation - 1 x daily - 7 x weekly - 1 sets - 10 reps - EO Feet Together Balance with Head Nods on Foam Pad - 1 x daily - 7 x weekly - 1 sets - 10 reps - EO     PT Short Term Goals - 03/26/21 1258       PT SHORT TERM GOAL #1   Title Pt will participate in more in depth vestibular re-assessment    Time 4    Period Weeks    Status New    Target Date 04/25/21      PT SHORT TERM GOAL #2   Title Pt will initiate HEP focusing on LE strength, balance and neck ROM    Time 4    Period Weeks    Status New    Target Date 04/25/21      PT SHORT TERM GOAL #3   Title Pt will demonstrate decreased falls risk during transitions as indicated by decrease in five time sit to stand to </= 17 seconds demonstrating ability to shift COG fully over BOS without UE support    Baseline 21 seconds with UE touching arm rests through out sit <> stand    Time 4    Period Weeks    Status New    Target Date 04/25/21      PT SHORT TERM GOAL #4   Title Pt will demonstrate decreased falls risk during ambulation as indicated by increase in DGI to >/= 16/24    Baseline 12/24    Time 4    Period Weeks  Status New    Target Date 04/25/21               PT Long Term Goals - 03/26/21 1300       PT LONG TERM GOAL #1   Title Pt will  report improvement in DPS to >/= 58 and increase in DFS to >/= 52    Baseline DPS: 45; DFS: 48    Time 8    Period Weeks    Status New    Target Date 05/25/21      PT LONG TERM GOAL #2   Title Pt will demonstrate independence with final HEP    Time 8    Period Weeks    Status New    Target Date 05/25/21      PT LONG TERM GOAL #3   Title Pt will demonstrate improvement in DGI to >/= 20/24    Time 8    Period Weeks    Status New    Target Date 05/25/21      PT LONG TERM GOAL #4   Title Pt will report no symptoms of dizziness during sit > stand, bending forwards, performing head/body turns and tilting head back    Time 8    Period Weeks    Status New    Target Date 05/25/21      PT LONG TERM GOAL #5   Title Pt will decrease five time sit to stand to </= 17 seconds without the use of UE when performing sit > stand and stand > sit    Time 8    Period Weeks    Status New    Target Date 05/25/21      Additional Long Term Goals   Additional Long Term Goals Yes      PT LONG TERM GOAL #6   Title Pt will demonstrate ability to perform neck extension without pain and will demonstrate 10 deg improvement in neck lateral flexion and rotation bilaterally with 50% decreased pain    Baseline see flowsheet    Time 8    Period Weeks    Status New    Target Date 05/25/21                   Plan - 04/01/21 1628     Clinical Impression Statement Performed re-assessment of vestibular system/function and compared findings with previous PT findings.  No significant changes from previous episode of care and pt continues to be negative for positional vertigo.  Pt continues to present with disequilibrium and motion sensitivity.  Re-initiated balance HEP to address balance and vestibular impairments identified.    Personal Factors and Comorbidities Comorbidity 3+;Fitness;Past/Current Experience    Comorbidities Allergies, asthma, breast CA with lumpectomy, HTN, hypothyroidism, h/o BPPV     Examination-Activity Limitations Bend;Locomotion Level;Reach Overhead;Stand    Examination-Participation Restrictions Cleaning;Laundry;Meal Prep    Stability/Clinical Decision Making Evolving/Moderate complexity    Rehab Potential Good    PT Frequency 2x / week    PT Duration 8 weeks    PT Treatment/Interventions ADLs/Self Care Home Management;Aquatic Therapy;Canalith Repostioning;Cryotherapy;Moist Heat;DME Instruction;Gait training;Stair training;Functional mobility training;Therapeutic activities;Therapeutic exercise;Balance training;Neuromuscular re-education;Patient/family education;Manual techniques;Passive range of motion;Dry needling;Vestibular    PT Next Visit Plan Add to HEP focusing on standing balance, LE strengthening, gait without cane.  Check in after ortho appt on 12/20 about neck-incorporate neck treatment?    Consulted and Agree with Plan of Care Patient  Patient will benefit from skilled therapeutic intervention in order to improve the following deficits and impairments:  Abnormal gait, Decreased balance, Decreased strength, Difficulty walking, Dizziness, Postural dysfunction, Pain  Visit Diagnosis: Unsteadiness on feet  Difficulty in walking, not elsewhere classified  Dizziness and giddiness  Repeated falls     Problem List Patient Active Problem List   Diagnosis Date Noted   Encounter for competency evaluation 01/09/2021   SOB (shortness of breath) 12/06/2020   Benign paroxysmal positional vertigo 05/12/2020   Orthostatic hypotension 09/23/2019   Dizziness 09/17/2019   OAB (overactive bladder) 08/27/2019   Pain of left thumb 08/27/2019   Family history of factor V Leiden mutation 08/11/2019   Peripheral edema 08/11/2019   Encounter for well adult exam with abnormal findings 08/08/2019   Hypothyroidism    Hypertension    Asthma    Allergic rhinitis    hx: right breast cancer 11/25/2011   Rico Junker, PT, DPT 04/01/21    4:41  PM    [facility] [address] [place], [place], [address] Phone: [phone]   Fax:  [phone]  Name: LEE-ANNE FLICKER MRN: [id number] Date of Birth: 1935-08-15

## 2021-04-08 ENCOUNTER — Ambulatory Visit: Payer: Medicare Other | Admitting: Physical Therapy

## 2021-04-08 ENCOUNTER — Other Ambulatory Visit: Payer: Self-pay

## 2021-04-08 DIAGNOSIS — R296 Repeated falls: Secondary | ICD-10-CM

## 2021-04-08 DIAGNOSIS — M542 Cervicalgia: Secondary | ICD-10-CM

## 2021-04-08 DIAGNOSIS — R42 Dizziness and giddiness: Secondary | ICD-10-CM

## 2021-04-08 DIAGNOSIS — R262 Difficulty in walking, not elsewhere classified: Secondary | ICD-10-CM

## 2021-04-08 DIAGNOSIS — R2681 Unsteadiness on feet: Secondary | ICD-10-CM | POA: Diagnosis not present

## 2021-04-08 NOTE — Therapy (Signed)
[facility] [address] [place], [place], [address] Phone: [phone]   Fax:  [phone]  Physical Therapy Treatment  Patient Details  Name: Julia Norton MRN: [id number] Date of Birth: 22-Dec-1935 Referring Provider (PT): Melvenia Beam, MD   Encounter Date: 04/08/2021   PT End of Session - 04/08/21 1642     Visit Number 3    Number of Visits 17    Date for PT Re-Evaluation 05/25/21    Authorization Type UHC    Progress Note Due on Visit 10    PT Start Time 1404    PT Stop Time 1450    PT Time Calculation (min) 46 min    Activity Tolerance Patient tolerated treatment well    Behavior During Therapy [facility] for tasks assessed/performed             Past Medical History:  Diagnosis Date   Allergic rhinitis    Asthma    Breast cancer ([place]) 1987   Erysipelas    Hemorrhoids    Hypertension    Hypothyroidism     Past Surgical History:  Procedure Laterality Date   BREAST LUMPECTOMY Right 1986   malignant   TONSILLECTOMY      There were no vitals filed for this visit.   Subjective Assessment - 04/08/21 1404     Subjective Saw orthopedic doctor about neck and hip.  Took x-rays and saw arthritis.  Referred pt for neck therapy.  Knee is more painful today.    Pertinent History Allergies, asthma, breast CA with lumpectomy, HTN, hypothyroidism, h/o BPPV    Limitations Standing;Walking    Patient Stated Goals To be back to normal; gain confidence to move around more.                [facility] PT Assessment - 04/08/21 1418       Posture/Postural Control   Posture/Postural Control Postural limitations    Postural Limitations Rounded Shoulders;Forward head;Increased thoracic kyphosis;Posterior pelvic tilt      ROM / Strength   AROM / PROM / Strength AROM      AROM   Overall AROM  Deficits    AROM Assessment Site Cervical    Cervical Flexion 50    Cervical Extension 35    Cervical - Right  Side Bend 25    Cervical - Left Side Bend 20    Cervical - Right Rotation 45    Cervical - Left Rotation 55      Palpation   Palpation comment Myofascial trigger points noted in R upper trap and SCM with pain referring to R forehead/orbital regions.  Manual palpation of cervical spine: decreased mobility in lower cervical spine.              [facility] Adult PT Treatment/Exercise - 04/08/21 1418       Exercises   Exercises Neck;Shoulder      Neck Exercises: Supine   Cervical Isometrics 5 secs;5 reps;Right rotation;Right lateral flexion    Neck Retraction 10 reps;5 secs    Neck Retraction Limitations Required total tactile and verbal cues to sequence deep neck flexor activation - pt attempted to use neck extensors or SCM.  Needs continued practice      Manual Therapy   Manual Therapy Joint mobilization;Soft tissue mobilization;Manual Traction;Myofascial release    Manual therapy comments Performed in supine    Joint Mobilization Grade II upglides and downglides to upper, middle and lower cervical spine with head/neck rotation to  R to improve rotation ROM    Soft tissue mobilization STM to R and L upper trap and R SCM to address painful trigger points    Myofascial Release Suboccipital release due to significant capital extension    Manual Traction combined with suboccipital release to increase upper cervical/suboccipitial ROM prior to performing deep neck flexor strengthening              PT Education - 04/08/21 1642     Education Details began to review neck and postural exercises but pt unable to sequence properly    Person(s) Educated Patient    Methods Explanation;Demonstration;Tactile cues;Verbal cues    Comprehension Need further instruction              PT Short Term Goals - 03/26/21 1258       PT SHORT TERM GOAL #1   Title Pt will participate in more in depth vestibular re-assessment    Time 4    Period Weeks    Status New    Target Date 04/25/21      PT  SHORT TERM GOAL #2   Title Pt will initiate HEP focusing on LE strength, balance and neck ROM    Time 4    Period Weeks    Status New    Target Date 04/25/21      PT SHORT TERM GOAL #3   Title Pt will demonstrate decreased falls risk during transitions as indicated by decrease in five time sit to stand to </= 17 seconds demonstrating ability to shift COG fully over BOS without UE support    Baseline 21 seconds with UE touching arm rests through out sit <> stand    Time 4    Period Weeks    Status New    Target Date 04/25/21      PT SHORT TERM GOAL #4   Title Pt will demonstrate decreased falls risk during ambulation as indicated by increase in DGI to >/= 16/24    Baseline 12/24    Time 4    Period Weeks    Status New    Target Date 04/25/21               PT Long Term Goals - 03/26/21 1300       PT LONG TERM GOAL #1   Title Pt will report improvement in DPS to >/= 58 and increase in DFS to >/= 52    Baseline DPS: 45; DFS: 48    Time 8    Period Weeks    Status New    Target Date 05/25/21      PT LONG TERM GOAL #2   Title Pt will demonstrate independence with final HEP    Time 8    Period Weeks    Status New    Target Date 05/25/21      PT LONG TERM GOAL #3   Title Pt will demonstrate improvement in DGI to >/= 20/24    Time 8    Period Weeks    Status New    Target Date 05/25/21      PT LONG TERM GOAL #4   Title Pt will report no symptoms of dizziness during sit > stand, bending forwards, performing head/body turns and tilting head back    Time 8    Period Weeks    Status New    Target Date 05/25/21      PT LONG TERM GOAL #5   Title Pt will decrease  five time sit to stand to </= 17 seconds without the use of UE when performing sit > stand and stand > sit    Time 8    Period Weeks    Status New    Target Date 05/25/21      Additional Long Term Goals   Additional Long Term Goals Yes      PT LONG TERM GOAL #6   Title Pt will demonstrate ability to  perform neck extension without pain and will demonstrate 10 deg improvement in neck lateral flexion and rotation bilaterally with 50% decreased pain    Baseline see flowsheet    Time 8    Period Weeks    Status New    Target Date 05/25/21              Plan - 04/08/21 1643     Clinical Impression Statement Performed further assessment of patient's cervical mobility and soft tissue assessment with multiple myofascial trigger points noted, decreased lower cervical and thoracic ROM and multiple postural abnormalities.  Began to address with supine manual therapy and strengthening exercises but pt unable to properly sequence exercises/mm activation today.  Will continue to review at next session and provide to pt for HEP.    Personal Factors and Comorbidities Comorbidity 3+;Fitness;Past/Current Experience    Comorbidities Allergies, asthma, breast CA with lumpectomy, HTN, hypothyroidism, h/o BPPV    Examination-Activity Limitations Bend;Locomotion Level;Reach Overhead;Stand    Examination-Participation Restrictions Cleaning;Laundry;Meal Prep    Stability/Clinical Decision Making Evolving/Moderate complexity    Clinical Decision Making Moderate    Rehab Potential Good    PT Frequency 2x / week    PT Duration 8 weeks    PT Treatment/Interventions ADLs/Self Care Home Management;Aquatic Therapy;Canalith Repostioning;Cryotherapy;Moist Heat;DME Instruction;Gait training;Stair training;Functional mobility training;Therapeutic activities;Therapeutic exercise;Balance training;Neuromuscular re-education;Patient/family education;Manual techniques;Passive range of motion;Dry needling;Vestibular    PT Next Visit Plan Neck: gentle manual therapy, thoracic extension, postural exercises, deep neck flexor strengthening.  Add to HEP focusing on standing balance, LE strengthening, gait without cane.    Consulted and Agree with Plan of Care Patient             Patient will benefit from skilled therapeutic  intervention in order to improve the following deficits and impairments:  Abnormal gait, Decreased balance, Decreased strength, Difficulty walking, Dizziness, Postural dysfunction, Pain, Decreased range of motion  Visit Diagnosis: Unsteadiness on feet  Difficulty in walking, not elsewhere classified  Dizziness and giddiness  Cervicalgia  Repeated falls     Problem List Patient Active Problem List   Diagnosis Date Noted   Encounter for competency evaluation 01/09/2021   SOB (shortness of breath) 12/06/2020   Benign paroxysmal positional vertigo 05/12/2020   Orthostatic hypotension 09/23/2019   Dizziness 09/17/2019   OAB (overactive bladder) 08/27/2019   Pain of left thumb 08/27/2019   Family history of factor V Leiden mutation 08/11/2019   Peripheral edema 08/11/2019   Encounter for well adult exam with abnormal findings 08/08/2019   Hypothyroidism    Hypertension    Asthma    Allergic rhinitis    hx: right breast cancer 11/25/2011    Rico Junker, PT, DPT 04/08/21    4:54 PM    [facility] [address] [place], [place], [address] Phone: [phone]   Fax:  [phone]  Name: Julia Norton MRN: [id number] Date of Birth: 1936/04/06

## 2021-04-16 ENCOUNTER — Ambulatory Visit: Payer: Medicare Other

## 2021-04-20 ENCOUNTER — Ambulatory Visit: Payer: Medicare Other | Attending: Neurology | Admitting: Physical Therapy

## 2021-04-20 ENCOUNTER — Other Ambulatory Visit: Payer: Self-pay

## 2021-04-20 DIAGNOSIS — R296 Repeated falls: Secondary | ICD-10-CM

## 2021-04-20 DIAGNOSIS — M542 Cervicalgia: Secondary | ICD-10-CM | POA: Diagnosis present

## 2021-04-20 DIAGNOSIS — R42 Dizziness and giddiness: Secondary | ICD-10-CM

## 2021-04-20 DIAGNOSIS — R262 Difficulty in walking, not elsewhere classified: Secondary | ICD-10-CM | POA: Diagnosis present

## 2021-04-20 DIAGNOSIS — R2681 Unsteadiness on feet: Secondary | ICD-10-CM

## 2021-04-20 NOTE — Patient Instructions (Addendum)
Access Code: [email or web address] URL: [email or web address] Date: 04/20/2021 Prepared by: Misty Stanley  Exercises Heel Toe Raises with Unilateral Counter Support - 1 x daily - 7 x weekly - 2 sets - 10 reps Sit to Stand with Immediate Step - 1 x daily - 7 x weekly - 3-5 sets Romberg Stance on Foam Pad with Head Rotation - 1 x daily - 7 x weekly - 1 sets - 10 reps Feet Together Balance with Head Nods on Foam Pad - 1 x daily - 7 x weekly - 1 sets - 10 reps Supine Deep Neck Flexor Nods - 1 x daily - 7 x weekly - 2 sets - 10 reps - 6 seconds hold

## 2021-04-21 NOTE — Therapy (Signed)
[facility] [address] [place], [place], [address] Phone: [phone]   Fax:  [phone]  Physical Therapy Treatment  Patient Details  Name: Julia Norton MRN: [id number] Date of Birth: 10-Jul-1935 Referring Provider (PT): Melvenia Beam, MD   Encounter Date: 04/20/2021   PT End of Session - 04/21/21 1518     Visit Number 4    Number of Visits 17    Date for PT Re-Evaluation 05/25/21    Authorization Type UHC    Progress Note Due on Visit 10    PT Start Time 1452    PT Stop Time 1535    PT Time Calculation (min) 43 min    Activity Tolerance Patient tolerated treatment well    Behavior During Therapy Julia Norton for tasks assessed/performed             Past Medical History:  Diagnosis Date   Allergic rhinitis    Asthma    Breast cancer ([place]) 1987   Erysipelas    Hemorrhoids    Hypertension    Hypothyroidism     Past Surgical History:  Procedure Laterality Date   BREAST LUMPECTOMY Right 1986   malignant   TONSILLECTOMY      There were no vitals filed for this visit.   Subjective Assessment - 04/20/21 1455     Subjective Was sick after Christmas and wasn't able to make it in to therapy last week; wasn't able to do any exercises when sick.  Neck is a little better; finished Prednisone the day after Christmas.  Has been using heat as needed; doesn't hurt as much when lying down and no longer having shooting pains into the back of the head.  Dizziness has been a little worse lately.    Pertinent History Allergies, asthma, breast CA with lumpectomy, HTN, hypothyroidism, h/o BPPV    Limitations Standing;Walking    Patient Stated Goals To be back to normal; gain confidence to move around more.    Currently in Pain? No/denies                [facility] Adult PT Treatment/Exercise - 04/21/21 1527       Exercises   Exercises Neck;Shoulder      Neck Exercises: Supine   Neck Retraction 10 reps;5 secs     Neck Retraction Limitations Supine deep neck flexor strengthening with PT performing palpation of SCM to provide feedback and cues for correct mm activation      Shoulder Exercises: Supine   Horizontal ABduction Strengthening;Both;5 reps;Theraband    Theraband Level (Shoulder Horizontal ABduction) Level 2 (Red)    Horizontal ABduction Limitations attempted to perform bilaterally but pt demonstrated increased use of upper trap; performed multiple attempts without resistance with PT providing hand over hand facilitation of horizontal ABD with scapular retraction; also provided pt with education on directional movements of scapula.  Pt continued to require maximum tactile and verbal cues to perform; did not add to HEP at this time                 Balance Exercises - 04/20/21 1527       Balance Exercises: Standing   Sit to Stand Elevated surface;Upper extremity support;Foam/compliant surface    Sit to Stand Limitations x5 to each side with step forward and head turn to ipsilateral side on solid surface, EO.  Transitioned to sit > stand with EC on solid surface, feet apart x 8 reps with supervision > feet apart on  compliant surface with EC x 8 reps with min A due to dizziness and mild imbalance.                PT Education - 04/21/21 1517     Education Details added deep neck flexor exercise to HEP; needs further education for postural exercises    Person(s) Educated Patient    Methods Explanation;Handout;Demonstration;Tactile cues;Verbal cues    Comprehension Verbalized understanding;Returned demonstration;Need further instruction              PT Short Term Goals - 03/26/21 1258       PT SHORT TERM GOAL #1   Title Pt will participate in more in depth vestibular re-assessment    Time 4    Period Weeks    Status New    Target Date 04/25/21      PT SHORT TERM GOAL #2   Title Pt will initiate HEP focusing on LE strength, balance and neck ROM    Time 4    Period  Weeks    Status New    Target Date 04/25/21      PT SHORT TERM GOAL #3   Title Pt will demonstrate decreased falls risk during transitions as indicated by decrease in five time sit to stand to </= 17 seconds demonstrating ability to shift COG fully over BOS without UE support    Baseline 21 seconds with UE touching arm rests through out sit <> stand    Time 4    Period Weeks    Status New    Target Date 04/25/21      PT SHORT TERM GOAL #4   Title Pt will demonstrate decreased falls risk during ambulation as indicated by increase in DGI to >/= 16/24    Baseline 12/24    Time 4    Period Weeks    Status New    Target Date 04/25/21               PT Long Term Goals - 03/26/21 1300       PT LONG TERM GOAL #1   Title Pt will report improvement in DPS to >/= 58 and increase in DFS to >/= 52    Baseline DPS: 45; DFS: 48    Time 8    Period Weeks    Status New    Target Date 05/25/21      PT LONG TERM GOAL #2   Title Pt will demonstrate independence with final HEP    Time 8    Period Weeks    Status New    Target Date 05/25/21      PT LONG TERM GOAL #3   Title Pt will demonstrate improvement in DGI to >/= 20/24    Time 8    Period Weeks    Status New    Target Date 05/25/21      PT LONG TERM GOAL #4   Title Pt will report no symptoms of dizziness during sit > stand, bending forwards, performing head/body turns and tilting head back    Time 8    Period Weeks    Status New    Target Date 05/25/21      PT LONG TERM GOAL #5   Title Pt will decrease five time sit to stand to </= 17 seconds without the use of UE when performing sit > stand and stand > sit    Time 8    Period Weeks    Status New  Target Date 05/25/21      Additional Long Term Goals   Additional Long Term Goals Yes      PT LONG TERM GOAL #6   Title Pt will demonstrate ability to perform neck extension without pain and will demonstrate 10 deg improvement in neck lateral flexion and rotation  bilaterally with 50% decreased pain    Baseline see flowsheet    Time 8    Period Weeks    Status New    Target Date 05/25/21                   Plan - 04/21/21 1518     Clinical Impression Statement Continued to review deep neck flexor activation and strengthening; added to HEP this session.  Began to incorporate supine postural exercises focusing on posterior chain exercises; did not add to HEP today due to increased cues and facilitation required from therapist; will continue to review.  Will also continue to address balance on more compliant surfaces and with vision removed for increased vestibular integration.    Personal Factors and Comorbidities Comorbidity 3+;Fitness;Past/Current Experience    Comorbidities Allergies, asthma, breast CA with lumpectomy, HTN, hypothyroidism, h/o BPPV    Examination-Activity Limitations Bend;Locomotion Level;Reach Overhead;Stand    Examination-Participation Restrictions Cleaning;Laundry;Meal Prep    Stability/Clinical Decision Making Evolving/Moderate complexity    Rehab Potential Good    PT Frequency 2x / week    PT Duration 8 weeks    PT Treatment/Interventions ADLs/Self Care Home Management;Aquatic Therapy;Canalith Repostioning;Cryotherapy;Moist Heat;DME Instruction;Gait training;Stair training;Functional mobility training;Therapeutic activities;Therapeutic exercise;Balance training;Neuromuscular re-education;Patient/family education;Manual techniques;Passive range of motion;Dry needling;Vestibular    PT Next Visit Plan Check STG.  For Neck/Posture: posterior chain strengthening - hip extension, thoracic extension, postural exercises, deep neck flexor strengthening.  Continue to work on standing balance on compliant surfaces, vision removed.  Gait without cane/SLS training.    Consulted and Agree with Plan of Care Patient             Patient will benefit from skilled therapeutic intervention in order to improve the following deficits and  impairments:  Abnormal gait, Decreased balance, Decreased strength, Difficulty walking, Dizziness, Postural dysfunction, Pain, Decreased range of motion  Visit Diagnosis: Unsteadiness on feet  Difficulty in walking, not elsewhere classified  Dizziness and giddiness  Cervicalgia  Repeated falls     Problem List Patient Active Problem List   Diagnosis Date Noted   Encounter for competency evaluation 01/09/2021   SOB (shortness of breath) 12/06/2020   Benign paroxysmal positional vertigo 05/12/2020   Orthostatic hypotension 09/23/2019   Dizziness 09/17/2019   OAB (overactive bladder) 08/27/2019   Pain of left thumb 08/27/2019   Family history of factor V Leiden mutation 08/11/2019   Peripheral edema 08/11/2019   Encounter for well adult exam with abnormal findings 08/08/2019   Hypothyroidism    Hypertension    Asthma    Allergic rhinitis    hx: right breast cancer 11/25/2011   Rico Junker, PT, DPT 04/21/21    3:32 PM   [facility] [address] [place], [place], [address] Phone: [phone]   Fax:  [phone]  Name: Julia Norton MRN: [id number] Date of Birth: [birth year]

## 2021-04-22 ENCOUNTER — Other Ambulatory Visit: Payer: Self-pay

## 2021-04-22 ENCOUNTER — Ambulatory Visit: Payer: Medicare Other

## 2021-04-22 DIAGNOSIS — R296 Repeated falls: Secondary | ICD-10-CM

## 2021-04-22 DIAGNOSIS — R2681 Unsteadiness on feet: Secondary | ICD-10-CM | POA: Diagnosis not present

## 2021-04-22 DIAGNOSIS — R42 Dizziness and giddiness: Secondary | ICD-10-CM

## 2021-04-22 DIAGNOSIS — R262 Difficulty in walking, not elsewhere classified: Secondary | ICD-10-CM

## 2021-04-22 DIAGNOSIS — M542 Cervicalgia: Secondary | ICD-10-CM

## 2021-04-22 NOTE — Therapy (Signed)
[facility] [address] [place], [place], [address] Phone: [phone]   Fax:  [phone]  Physical Therapy Treatment  Patient Details  Name: Julia Norton MRN: [id number] Date of Birth: November 26, 1935 Referring Provider (PT): Melvenia Beam, MD   Encounter Date: 04/22/2021   PT End of Session - 04/22/21 1413     Visit Number 5    Number of Visits 17    Date for PT Re-Evaluation 05/25/21    Authorization Type UHC    Progress Note Due on Visit 10    PT Start Time 1317    PT Stop Time 1359    PT Time Calculation (min) 42 min    Activity Tolerance Patient tolerated treatment well    Behavior During Therapy [facility] for tasks assessed/performed             Past Medical History:  Diagnosis Date   Allergic rhinitis    Asthma    Breast cancer ([place]) 1987   Erysipelas    Hemorrhoids    Hypertension    Hypothyroidism     Past Surgical History:  Procedure Laterality Date   BREAST LUMPECTOMY Right 1986   malignant   TONSILLECTOMY      There were no vitals filed for this visit.   Subjective Assessment - 04/22/21 1319     Subjective Patient reports no new changes. Still experiencing some dizziness and feels as it has been worse recently. Neck feels better, but still having some pain.    Pertinent History Allergies, asthma, breast CA with lumpectomy, HTN, hypothyroidism, h/o BPPV    Limitations Standing;Walking    Patient Stated Goals To be back to normal; gain confidence to move around more.    Currently in Pain? Yes    Pain Score 3     Pain Location Neck    Pain Orientation Right;Left    Pain Descriptors / Indicators Tightness    Pain Type Acute pain    Pain Frequency Intermittent    Aggravating Factors  head turns                [facility] PT Assessment - 04/22/21 0001       Standardized Balance Assessment   Standardized Balance Assessment Five Times Sit to Stand;Dynamic Gait Index    Five times  sit to stand comments  18.19 secs with UE support, still challenge coming to upright position      Dynamic Gait Index   Level Surface Moderate Impairment    Change in Gait Speed Mild Impairment    Gait with Horizontal Head Turns Mild Impairment    Gait with Vertical Head Turns Mild Impairment    Gait and Pivot Turn Mild Impairment    Step Over Obstacle Moderate Impairment    Step Around Obstacles Normal    Steps Moderate Impairment    Total Score 14    DGI comment: 14/24              [facility] Adult PT Treatment/Exercise - 04/22/21 0001       Transfers   Transfers Sit to Stand;Stand to Sit    Sit to Stand 5: Supervision    Five time sit to stand comments  18.19 secs with UE support, still challenge coming to upright position    Stand to Sit 5: Supervision            Completed all of the following exercises during session as review HEP. Educated on option to purchase  balance pad.  Access Code: [email or web address] URL: [email or web address] Date: 04/20/2021 Prepared by: Misty Stanley   Exercises Heel Toe Raises with Unilateral Counter Support - 1 x daily - 7 x weekly - 2 sets - 10 reps Sit to Stand with Immediate Step - 1 x daily - 7 x weekly - 3-5 sets Romberg Stance on Foam Pad with Head Rotation - 1 x daily - 7 x weekly - 1 sets - 10 reps Feet Together Balance with Head Nods on Foam Pad - 1 x daily - 7 x weekly - 1 sets - 10 reps Supine Deep Neck Flexor Nods - 1 x daily - 7 x weekly - 2 sets - 10 reps - 6 seconds hold - continue to provide tactile cues for proper completion.         PT Short Term Goals - 04/22/21 1321       PT SHORT TERM GOAL #1   Title Pt will participate in more in depth vestibular re-assessment    Baseline had vestibular assesment    Time 4    Period Weeks    Status Achieved    Target Date 04/25/21      PT SHORT TERM GOAL #2   Title Pt will initiate HEP focusing on LE strength, balance and neck ROM    Baseline HEP initaited and  reviewed on 04/22/21    Time 4    Period Weeks    Status Achieved    Target Date 04/25/21      PT SHORT TERM GOAL #3   Title Pt will demonstrate decreased falls risk during transitions as indicated by decrease in five time sit to stand to </= 17 seconds demonstrating ability to shift COG fully over BOS without UE support    Baseline 21 seconds with UE touching arm rests through out sit <> stand; 18.19 secs with UE support, still challenge coming to upright position    Time 4    Period Weeks    Status Not Met    Target Date 04/25/21      PT SHORT TERM GOAL #4   Title Pt will demonstrate decreased falls risk during ambulation as indicated by increase in DGI to >/= 16/24    Baseline 12/24; 14/24    Time 4    Period Weeks    Status Not Met    Target Date 04/25/21               PT Long Term Goals - 03/26/21 1300       PT LONG TERM GOAL #1   Title Pt will report improvement in DPS to >/= 58 and increase in DFS to >/= 52    Baseline DPS: 45; DFS: 48    Time 8    Period Weeks    Status New    Target Date 05/25/21      PT LONG TERM GOAL #2   Title Pt will demonstrate independence with final HEP    Time 8    Period Weeks    Status New    Target Date 05/25/21      PT LONG TERM GOAL #3   Title Pt will demonstrate improvement in DGI to >/= 20/24    Time 8    Period Weeks    Status New    Target Date 05/25/21      PT LONG TERM GOAL #4   Title Pt will report no symptoms of dizziness during sit > stand, bending forwards, performing head/body  turns and tilting head back    Time 8    Period Weeks    Status New    Target Date 05/25/21      PT LONG TERM GOAL #5   Title Pt will decrease five time sit to stand to </= 17 seconds without the use of UE when performing sit > stand and stand > sit    Time 8    Period Weeks    Status New    Target Date 05/25/21      Additional Long Term Goals   Additional Long Term Goals Yes      PT LONG TERM GOAL #6   Title Pt will  demonstrate ability to perform neck extension without pain and will demonstrate 10 deg improvement in neck lateral flexion and rotation bilaterally with 50% decreased pain    Baseline see flowsheet    Time 8    Period Weeks    Status New    Target Date 05/25/21                   Plan - 04/22/21 1411     Clinical Impression Statement Completed assesment of patient's progress toward STGs. Patient able to meet STG #1 and 2. Patient made progress toward STG #3 and #4 nbut not to goal level. Patient improved DGI to 14/24 and 5x sit <> stand to 18.19 seconds. Reviewed HEP with patient, and continued deep neck flexor training.    Personal Factors and Comorbidities Comorbidity 3+;Fitness;Past/Current Experience    Comorbidities Allergies, asthma, breast CA with lumpectomy, HTN, hypothyroidism, h/o BPPV    Examination-Activity Limitations Bend;Locomotion Level;Reach Overhead;Stand    Examination-Participation Restrictions Cleaning;Laundry;Meal Prep    Stability/Clinical Decision Making Evolving/Moderate complexity    Rehab Potential Good    PT Frequency 2x / week    PT Duration 8 weeks    PT Treatment/Interventions ADLs/Self Care Home Management;Aquatic Therapy;Canalith Repostioning;Cryotherapy;Moist Heat;DME Instruction;Gait training;Stair training;Functional mobility training;Therapeutic activities;Therapeutic exercise;Balance training;Neuromuscular re-education;Patient/family education;Manual techniques;Passive range of motion;Dry needling;Vestibular    PT Next Visit Plan For Neck/Posture: posterior chain strengthening - hip extension, thoracic extension, postural exercises, deep neck flexor strengthening.  Continue to work on standing balance on compliant surfaces, vision removed.  Gait without cane/SLS training.    Consulted and Agree with Plan of Care Patient             Patient will benefit from skilled therapeutic intervention in order to improve the following deficits and  impairments:  Abnormal gait, Decreased balance, Decreased strength, Difficulty walking, Dizziness, Postural dysfunction, Pain, Decreased range of motion  Visit Diagnosis: Unsteadiness on feet  Difficulty in walking, not elsewhere classified  Dizziness and giddiness  Cervicalgia  Repeated falls     Problem List Patient Active Problem List   Diagnosis Date Noted   Encounter for competency evaluation 01/09/2021   SOB (shortness of breath) 12/06/2020   Benign paroxysmal positional vertigo 05/12/2020   Orthostatic hypotension 09/23/2019   Dizziness 09/17/2019   OAB (overactive bladder) 08/27/2019   Pain of left thumb 08/27/2019   Family history of factor V Leiden mutation 08/11/2019   Peripheral edema 08/11/2019   Encounter for well adult exam with abnormal findings 08/08/2019   Hypothyroidism    Hypertension    Asthma    Allergic rhinitis    hx: right breast cancer 11/25/2011    Jones Bales, PT, DPT 04/22/2021, 2:14 PM  [facility] [address] [place], [place], [address] Phone: [phone]  Fax:  [phone]  Name: Julia Norton MRN: [id number] Date of Birth: 01/14/1936

## 2021-04-27 ENCOUNTER — Other Ambulatory Visit: Payer: Self-pay

## 2021-04-27 ENCOUNTER — Ambulatory Visit: Payer: Medicare Other | Admitting: Physical Therapy

## 2021-04-27 DIAGNOSIS — R262 Difficulty in walking, not elsewhere classified: Secondary | ICD-10-CM

## 2021-04-27 DIAGNOSIS — R2681 Unsteadiness on feet: Secondary | ICD-10-CM | POA: Diagnosis not present

## 2021-04-27 DIAGNOSIS — R296 Repeated falls: Secondary | ICD-10-CM

## 2021-04-27 DIAGNOSIS — M542 Cervicalgia: Secondary | ICD-10-CM

## 2021-04-27 DIAGNOSIS — R42 Dizziness and giddiness: Secondary | ICD-10-CM

## 2021-04-27 NOTE — Patient Instructions (Signed)
Gaze Stabilization: Standing Feet Apart    Feet shoulder width apart, keeping eyes on target on wall __3__ feet away, have a chair in front for support if needed but try not to touch. Tilt head down 15-30 and move head side to side for __60__ seconds.  Repeat while moving head up and down for __60__ seconds. Do __2__ sessions per day.

## 2021-04-28 ENCOUNTER — Encounter: Payer: Self-pay | Admitting: Physical Therapy

## 2021-04-28 NOTE — Therapy (Signed)
[facility] [address] [place], [place], [address] Phone: [phone]   Fax:  [phone]  Physical Therapy Treatment  Patient Details  Name: Julia Norton MRN: [id number] Date of Birth: Sep 07, 1935 Referring Provider (PT): Melvenia Beam, MD   Encounter Date: 04/27/2021   PT End of Session - 04/28/21 1600     Visit Number 6    Number of Visits 17    Date for PT Re-Evaluation 05/25/21    Authorization Type UHC    Progress Note Due on Visit 10    PT Start Time 1449    PT Stop Time 1530    PT Time Calculation (min) 41 min    Activity Tolerance Patient tolerated treatment well    Behavior During Therapy [facility] for tasks assessed/performed             Past Medical History:  Diagnosis Date   Allergic rhinitis    Asthma    Breast cancer ([place]) 1987   Erysipelas    Hemorrhoids    Hypertension    Hypothyroidism     Past Surgical History:  Procedure Laterality Date   BREAST LUMPECTOMY Right 1986   malignant   TONSILLECTOMY      There were no vitals filed for this visit.   Subjective Assessment - 04/27/21 1451     Subjective Reports having dizziness yesterday but changes intensity daily.  Yesterday was a bad day for dizziness, had a hard time bending down.  Looked up how to do self neck massage on You Tube.  Neck is a little painful.    Pertinent History Allergies, asthma, breast CA with lumpectomy, HTN, hypothyroidism, h/o BPPV    Limitations Standing;Walking    Patient Stated Goals To be back to normal; gain confidence to move around more.    Currently in Pain? Yes              Vestibular Treatment/Exercise - 04/27/21 1456       Vestibular Treatment/Exercise   Vestibular Treatment Provided Gaze    Gaze Exercises X1 Viewing Horizontal;X1 Viewing Vertical      X1 Viewing Horizontal   Foot Position seated with back support; standing feet apart with UE support and then without UE support     Comments 30 seconds; no dizziness.  Increased balance challenge in standing/increased sway but no dizziness      X1 Viewing Vertical   Foot Position seated with back support; standing feet apart with UE support and then without UE support    Comments 30 seconds; no dizziness.  Increased balance challenge in standing/increased sway but no dizziness               Balance Exercises - 04/27/21 1526       Balance Exercises: Standing   Balance Beam Standing tandem on blue foam balance beam performing anterior<>posterior weight shifts x 10 reps with each LE forwards.  Then added in back leg lift > SLS > tandem gait forwards x 4 laps forwards with intermittent UE support.  Changed to standing across beam with narrow BOS while performing 10 reps alternating UE raises keeping eyes forwards, second set of 10 reps added in vertical head nods and gaze upwards.  Performed alternating reaching across midline with trunk rotation keeping eyes forwards x 10 reps; second set added in head turns and gaze laterally to L and R x 10 reps.  Finished with side stepping to L and R x 2 laps with intermittent  UE support and focus on weight shift and full step length, foot clearance.                PT Education - 04/28/21 1600     Education Details updated HEP    Person(s) Educated Patient    Methods Explanation;Demonstration;Handout    Comprehension Verbalized understanding;Returned demonstration              PT Short Term Goals - 04/22/21 1321       PT SHORT TERM GOAL #1   Title Pt will participate in more in depth vestibular re-assessment    Baseline had vestibular assesment    Time 4    Period Weeks    Status Achieved    Target Date 04/25/21      PT SHORT TERM GOAL #2   Title Pt will initiate HEP focusing on LE strength, balance and neck ROM    Baseline HEP initaited and reviewed on 04/22/21    Time 4    Period Weeks    Status Achieved    Target Date 04/25/21      PT SHORT TERM  GOAL #3   Title Pt will demonstrate decreased falls risk during transitions as indicated by decrease in five time sit to stand to </= 17 seconds demonstrating ability to shift COG fully over BOS without UE support    Baseline 21 seconds with UE touching arm rests through out sit <> stand; 18.19 secs with UE support, still challenge coming to upright position    Time 4    Period Weeks    Status Not Met    Target Date 04/25/21      PT SHORT TERM GOAL #4   Title Pt will demonstrate decreased falls risk during ambulation as indicated by increase in DGI to >/= 16/24    Baseline 12/24; 14/24    Time 4    Period Weeks    Status Not Met    Target Date 04/25/21               PT Long Term Goals - 03/26/21 1300       PT LONG TERM GOAL #1   Title Pt will report improvement in DPS to >/= 58 and increase in DFS to >/= 52    Baseline DPS: 45; DFS: 48    Time 8    Period Weeks    Status New    Target Date 05/25/21      PT LONG TERM GOAL #2   Title Pt will demonstrate independence with final HEP    Time 8    Period Weeks    Status New    Target Date 05/25/21      PT LONG TERM GOAL #3   Title Pt will demonstrate improvement in DGI to >/= 20/24    Time 8    Period Weeks    Status New    Target Date 05/25/21      PT LONG TERM GOAL #4   Title Pt will report no symptoms of dizziness during sit > stand, bending forwards, performing head/body turns and tilting head back    Time 8    Period Weeks    Status New    Target Date 05/25/21      PT LONG TERM GOAL #5   Title Pt will decrease five time sit to stand to </= 17 seconds without the use of UE when performing sit > stand and stand > sit  Time 8    Period Weeks    Status New    Target Date 05/25/21      Additional Long Term Goals   Additional Long Term Goals Yes      PT LONG TERM GOAL #6   Title Pt will demonstrate ability to perform neck extension without pain and will demonstrate 10 deg improvement in neck lateral  flexion and rotation bilaterally with 50% decreased pain    Baseline see flowsheet    Time 8    Period Weeks    Status New    Target Date 05/25/21                   Plan - 04/28/21 1602     Clinical Impression Statement Continued to address vestibular and balance impairments with addition of x1 viewing in standing.  Also continued to address dynamic balance impairments with use of compliant surface and addition of dynamic UE movement, weight shifting and SLS.    Personal Factors and Comorbidities Comorbidity 3+;Fitness;Past/Current Experience    Comorbidities Allergies, asthma, breast CA with lumpectomy, HTN, hypothyroidism, h/o BPPV    Examination-Activity Limitations Bend;Locomotion Level;Reach Overhead;Stand    Examination-Participation Restrictions Cleaning;Laundry;Meal Prep    Rehab Potential Good    PT Frequency 2x / week    PT Duration 8 weeks    PT Treatment/Interventions ADLs/Self Care Home Management;Aquatic Therapy;Canalith Repostioning;Cryotherapy;Moist Heat;DME Instruction;Gait training;Stair training;Functional mobility training;Therapeutic activities;Therapeutic exercise;Balance training;Neuromuscular re-education;Patient/family education;Manual techniques;Passive range of motion;Dry needling;Vestibular    PT Next Visit Plan For Neck/Posture: posterior chain strengthening - hip extension, thoracic extension, postural exercises, deep neck flexor strengthening.  Continue to work on standing balance on compliant surfaces, vision removed.  Gait without cane/SLS training.    Consulted and Agree with Plan of Care Patient             Patient will benefit from skilled therapeutic intervention in order to improve the following deficits and impairments:  Abnormal gait, Decreased balance, Decreased strength, Difficulty walking, Dizziness, Postural dysfunction, Pain, Decreased range of motion  Visit Diagnosis: Unsteadiness on feet  Difficulty in walking, not elsewhere  classified  Dizziness and giddiness  Cervicalgia  Repeated falls     Problem List Patient Active Problem List   Diagnosis Date Noted   Encounter for competency evaluation 01/09/2021   SOB (shortness of breath) 12/06/2020   Benign paroxysmal positional vertigo 05/12/2020   Orthostatic hypotension 09/23/2019   Dizziness 09/17/2019   OAB (overactive bladder) 08/27/2019   Pain of left thumb 08/27/2019   Family history of factor V Leiden mutation 08/11/2019   Peripheral edema 08/11/2019   Encounter for well adult exam with abnormal findings 08/08/2019   Hypothyroidism    Hypertension    Asthma    Allergic rhinitis    hx: right breast cancer 11/25/2011    Rico Junker, PT, DPT 04/28/21    4:13 PM    [facility] [address] [place], [place], [address] Phone: [phone]   Fax:  [phone]  Name: SHAYLEIGH BOULDIN MRN: [id number] Date of Birth: 11-Jun-1935

## 2021-04-29 ENCOUNTER — Ambulatory Visit: Payer: Medicare Other | Admitting: Physical Therapy

## 2021-04-29 ENCOUNTER — Other Ambulatory Visit: Payer: Self-pay

## 2021-04-29 DIAGNOSIS — M542 Cervicalgia: Secondary | ICD-10-CM

## 2021-04-29 DIAGNOSIS — R2681 Unsteadiness on feet: Secondary | ICD-10-CM

## 2021-04-29 DIAGNOSIS — R262 Difficulty in walking, not elsewhere classified: Secondary | ICD-10-CM

## 2021-04-29 DIAGNOSIS — R296 Repeated falls: Secondary | ICD-10-CM

## 2021-04-29 DIAGNOSIS — R42 Dizziness and giddiness: Secondary | ICD-10-CM

## 2021-04-29 NOTE — Therapy (Signed)
[facility] [address] [place], [place], [address] Phone: [phone]   Fax:  [phone]  Physical Therapy Treatment  Patient Details  Name: Julia Norton MRN: [id number] Date of Birth: 31-Aug-1935 Referring Provider (PT): Melvenia Beam, MD   Encounter Date: 04/29/2021   PT End of Session - 04/29/21 2029     Visit Number 7    Number of Visits 17    Date for PT Re-Evaluation 05/25/21    Authorization Type U[employer]    Progress Note Due on Visit 10    PT Start Time 1448    PT Stop Time 1530    PT Time Calculation (min) 42 min    Activity Tolerance Patient tolerated treatment well    Behavior During Therapy [facility] for tasks assessed/performed             Past Medical History:  Diagnosis Date   Allergic rhinitis    Asthma    Breast cancer ([place]) 1987   Erysipelas    Hemorrhoids    Hypertension    Hypothyroidism     Past Surgical History:  Procedure Laterality Date   BREAST LUMPECTOMY Right 1986   malignant   TONSILLECTOMY      There were no vitals filed for this visit.   Subjective Assessment - 04/29/21 1452     Subjective Usual dizziness since last session.  Neck is "not too bad." Has been doing a little bit of self-massage.    Pertinent History Allergies, asthma, breast CA with lumpectomy, HTN, hypothyroidism, h/o BPPV    Limitations Standing;Walking    Patient Stated Goals To be back to normal; gain confidence to move around more.    Currently in Pain? Yes              [facility] Adult PT Treatment/Exercise - 04/29/21 1453       Exercises   Exercises Neck;Shoulder      Neck Exercises: Supine   Neck Retraction 10 reps;5 secs;Limitations    Neck Retraction Limitations Isolated deep neck flexor activation/strengthening and then added in 10 reps with bridging for posterior hip activation      Shoulder Exercises: Supine   Horizontal ABduction Strengthening;Right;Left;Both;10 reps;Theraband     Theraband Level (Shoulder Horizontal ABduction) Level 1 (Yellow)    Horizontal ABduction Limitations performed single side x 10 reps each side and then bilat with therapist providing cues to decrease compensatory use of upper trap    Flexion Strengthening;Both;10 reps;Theraband    Theraband Level (Shoulder Flexion) Level 1 (Yellow)    Flexion Limitations holding band in bilat UE, pulling apart and maintaining tension while performing bilat flexion overhead    Diagonals Strengthening;Right;Left;10 reps;Theraband    Theraband Level (Shoulder Diagonals) Level 1 (Yellow)    Diagonals Limitations PNF D2 pattern               PT Education - 04/29/21 2028     Education Details continued neck and postural muscle activation training    Person(s) Educated Patient    Methods Explanation    Comprehension Verbalized understanding              PT Short Term Goals - 04/22/21 1321       PT SHORT TERM GOAL #1   Title Pt will participate in more in depth vestibular re-assessment    Baseline had vestibular assesment    Time 4    Period Weeks    Status Achieved    Target Date  04/25/21      PT SHORT TERM GOAL #2   Title Pt will initiate HEP focusing on LE strength, balance and neck ROM    Baseline HEP initaited and reviewed on 04/22/21    Time 4    Period Weeks    Status Achieved    Target Date 04/25/21      PT SHORT TERM GOAL #3   Title Pt will demonstrate decreased falls risk during transitions as indicated by decrease in five time sit to stand to </= 17 seconds demonstrating ability to shift COG fully over BOS without UE support    Baseline 21 seconds with UE touching arm rests through out sit <> stand; 18.19 secs with UE support, still challenge coming to upright position    Time 4    Period Weeks    Status Not Met    Target Date 04/25/21      PT SHORT TERM GOAL #4   Title Pt will demonstrate decreased falls risk during ambulation as indicated by increase in DGI to >/= 16/24     Baseline 12/24; 14/24    Time 4    Period Weeks    Status Not Met    Target Date 04/25/21               PT Long Term Goals - 03/26/21 1300       PT LONG TERM GOAL #1   Title Pt will report improvement in DPS to >/= 58 and increase in DFS to >/= 52    Baseline DPS: 45; DFS: 48    Time 8    Period Weeks    Status New    Target Date 05/25/21      PT LONG TERM GOAL #2   Title Pt will demonstrate independence with final HEP    Time 8    Period Weeks    Status New    Target Date 05/25/21      PT LONG TERM GOAL #3   Title Pt will demonstrate improvement in DGI to >/= 20/24    Time 8    Period Weeks    Status New    Target Date 05/25/21      PT LONG TERM GOAL #4   Title Pt will report no symptoms of dizziness during sit > stand, bending forwards, performing head/body turns and tilting head back    Time 8    Period Weeks    Status New    Target Date 05/25/21      PT LONG TERM GOAL #5   Title Pt will decrease five time sit to stand to </= 17 seconds without the use of UE when performing sit > stand and stand > sit    Time 8    Period Weeks    Status New    Target Date 05/25/21      Additional Long Term Goals   Additional Long Term Goals Yes      PT LONG TERM GOAL #6   Title Pt will demonstrate ability to perform neck extension without pain and will demonstrate 10 deg improvement in neck lateral flexion and rotation bilaterally with 50% decreased pain    Baseline see flowsheet    Time 8    Period Weeks    Status New    Target Date 05/25/21              Plan - 04/29/21 2029     Clinical Impression Statement Continued to address postural  abnormalities and neck pain with supine deep neck flexor strengthening and resisted postural exercises.  With max-total verbal and tactile cues from PT, pt was able to progress to performing postural exercises without overuse of upper trap.  Pt did report pain in R shoulder with diagonal exercises and reports ongoing mild  pain in R neck.  Due to shoulder pain, did not add to HEP this visit.  Will continue to address and progress towards LTG.    Personal Factors and Comorbidities Comorbidity 3+;Fitness;Past/Current Experience    Comorbidities Allergies, asthma, breast CA with lumpectomy, HTN, hypothyroidism, h/o BPPV    Examination-Activity Limitations Bend;Locomotion Level;Reach Overhead;Stand    Examination-Participation Restrictions Cleaning;Laundry;Meal Prep    Rehab Potential Good    PT Frequency 2x / week    PT Duration 8 weeks    PT Treatment/Interventions ADLs/Self Care Home Management;Aquatic Therapy;Canalith Repostioning;Cryotherapy;Moist Heat;DME Instruction;Gait training;Stair training;Functional mobility training;Therapeutic activities;Therapeutic exercise;Balance training;Neuromuscular re-education;Patient/family education;Manual techniques;Passive range of motion;Dry needling;Vestibular    PT Next Visit Plan For Neck/Posture: posterior chain strengthening - hip extension, thoracic extension, postural exercises, deep neck flexor strengthening.  Continue to work on [employer], standing balance on compliant surfaces, vision removed.  Gait without cane/SLS training.    Consulted and Agree with Plan of Care Patient             Patient will benefit from skilled therapeutic intervention in order to improve the following deficits and impairments:  Abnormal gait, Decreased balance, Decreased strength, Difficulty walking, Dizziness, Postural dysfunction, Pain, Decreased range of motion  Visit Diagnosis: Unsteadiness on feet  Difficulty in walking, not elsewhere classified  Dizziness and giddiness  Cervicalgia  Repeated falls   Problem List Patient Active Problem List   Diagnosis Date Noted   Encounter for competency evaluation 01/09/2021   SOB (shortness of breath) 12/06/2020   Benign paroxysmal positional vertigo 05/12/2020   Orthostatic hypotension 09/23/2019   Dizziness 09/17/2019   OAB  (overactive bladder) 08/27/2019   Pain of left thumb 08/27/2019   Family history of factor V Leiden mutation 08/11/2019   Peripheral edema 08/11/2019   Encounter for well adult exam with abnormal findings 08/08/2019   Hypothyroidism    Hypertension    Asthma    Allergic rhinitis    hx: right breast cancer 11/25/2011    Rico Junker, PT, DPT 04/29/21    9:27 PM  [facility] [address] [place], [place], [address] Phone: [phone]   Fax:  [phone]  Name: DARINA HARTWELL MRN: [id number] Date of Birth: [birth year]

## 2021-05-04 ENCOUNTER — Other Ambulatory Visit: Payer: Self-pay

## 2021-05-04 ENCOUNTER — Ambulatory Visit: Payer: Medicare Other | Admitting: Physical Therapy

## 2021-05-04 DIAGNOSIS — R296 Repeated falls: Secondary | ICD-10-CM

## 2021-05-04 DIAGNOSIS — R262 Difficulty in walking, not elsewhere classified: Secondary | ICD-10-CM

## 2021-05-04 DIAGNOSIS — R2681 Unsteadiness on feet: Secondary | ICD-10-CM

## 2021-05-04 DIAGNOSIS — R42 Dizziness and giddiness: Secondary | ICD-10-CM

## 2021-05-04 NOTE — Patient Instructions (Signed)
Gaze Stabilization: Standing Feet Together (Compliant Surface)    Feet together on pillow, keeping eyes on target on wall __3__ feet away, tilt head down 15-30 and move head side to side for __60__ seconds. Repeat while moving head up and down for __60__ seconds. Do __2__ sessions per day.

## 2021-05-04 NOTE — Therapy (Signed)
[facility] [address] [place], [place], [address] Phone: [phone]   Fax:  [phone]  Physical Therapy Treatment  Patient Details  Name: Julia Norton MRN: [id number] Date of Birth: 05-Sep-1935 Referring Provider (PT): Melvenia Beam, MD   Encounter Date: 05/04/2021   PT End of Session - 05/04/21 1644     Visit Number 8    Number of Visits 17    Date for PT Re-Evaluation 05/25/21    Authorization Type UHC    Progress Note Due on Visit 10    PT Start Time 1447    PT Stop Time 1530    PT Time Calculation (min) 43 min    Activity Tolerance Patient tolerated treatment well    Behavior During Therapy [facility] for tasks assessed/performed             Past Medical History:  Diagnosis Date   Allergic rhinitis    Asthma    Breast cancer ([place]) 1987   Erysipelas    Hemorrhoids    Hypertension    Hypothyroidism     Past Surgical History:  Procedure Laterality Date   BREAST LUMPECTOMY Right 1986   malignant   TONSILLECTOMY      There were no vitals filed for this visit.   Subjective Assessment - 05/04/21 1452     Subjective Day before yesterday was a bad day but yesterday and today are good.  Head still feels like it has pressure/heaviness on it.    Pertinent History Allergies, asthma, breast CA with lumpectomy, HTN, hypothyroidism, h/o BPPV    Limitations Standing;Walking    Patient Stated Goals To be back to normal; gain confidence to move around more.    Currently in Pain? Yes    Pain Score 2     Pain Location Neck    Pain Orientation Right    Pain Descriptors / Indicators Tightness                 [facility] Adult PT Treatment/Exercise - 05/04/21 1512       Ambulation/Gait   Ramp 4: Min assist    Ramp Details (indicate cue type and reason) standing facing up the ramp and then down the ramp on solid ramp and then with blue mat over ramp for compliant surface.  Performed 1 set x 10 reps  stepping backwards in place with EO and then EC.  Also performed on solid ramp 10 reps head nods and head turns with EC             Vestibular Treatment/Exercise - 05/04/21 1457       Vestibular Treatment/Exercise   Vestibular Treatment Provided Gaze    Gaze Exercises X1 Viewing Horizontal;X1 Viewing Vertical      X1 Viewing Horizontal   Foot Position standing feet together solid surface; compliant surface feet apart and then feet together    Reps 3    Comments 60 seconds, no dizziness but slightly more imbalance with vertical head movements      X1 Viewing Vertical   Foot Position standing feet together solid surface; compliant surface feet apart and then feet together    Reps 3    Comments 60 seconds, no dizziness but slightly more imbalance with vertical head movements                    PT Education - 05/04/21 1643     Education Details updated VOR for HEP  Person(s) Educated Patient    Methods Explanation;Demonstration;Handout    Comprehension Verbalized understanding;Returned demonstration              PT Short Term Goals - 04/22/21 1321       PT SHORT TERM GOAL #1   Title Pt will participate in more in depth vestibular re-assessment    Baseline had vestibular assesment    Time 4    Period Weeks    Status Achieved    Target Date 04/25/21      PT SHORT TERM GOAL #2   Title Pt will initiate HEP focusing on LE strength, balance and neck ROM    Baseline HEP initaited and reviewed on 04/22/21    Time 4    Period Weeks    Status Achieved    Target Date 04/25/21      PT SHORT TERM GOAL #3   Title Pt will demonstrate decreased falls risk during transitions as indicated by decrease in five time sit to stand to </= 17 seconds demonstrating ability to shift COG fully over BOS without UE support    Baseline 21 seconds with UE touching arm rests through out sit <> stand; 18.19 secs with UE support, still challenge coming to upright position    Time 4     Period Weeks    Status Not Met    Target Date 04/25/21      PT SHORT TERM GOAL #4   Title Pt will demonstrate decreased falls risk during ambulation as indicated by increase in DGI to >/= 16/24    Baseline 12/24; 14/24    Time 4    Period Weeks    Status Not Met    Target Date 04/25/21               PT Long Term Goals - 03/26/21 1300       PT LONG TERM GOAL #1   Title Pt will report improvement in DPS to >/= 58 and increase in DFS to >/= 52    Baseline DPS: 45; DFS: 48    Time 8    Period Weeks    Status New    Target Date 05/25/21      PT LONG TERM GOAL #2   Title Pt will demonstrate independence with final HEP    Time 8    Period Weeks    Status New    Target Date 05/25/21      PT LONG TERM GOAL #3   Title Pt will demonstrate improvement in DGI to >/= 20/24    Time 8    Period Weeks    Status New    Target Date 05/25/21      PT LONG TERM GOAL #4   Title Pt will report no symptoms of dizziness during sit > stand, bending forwards, performing head/body turns and tilting head back    Time 8    Period Weeks    Status New    Target Date 05/25/21      PT LONG TERM GOAL #5   Title Pt will decrease five time sit to stand to </= 17 seconds without the use of UE when performing sit > stand and stand > sit    Time 8    Period Weeks    Status New    Target Date 05/25/21      Additional Long Term Goals   Additional Long Term Goals Yes      PT LONG TERM GOAL #6  Title Pt will demonstrate ability to perform neck extension without pain and will demonstrate 10 deg improvement in neck lateral flexion and rotation bilaterally with 50% decreased pain    Baseline see flowsheet    Time 8    Period Weeks    Status New    Target Date 05/25/21                   Plan - 05/04/21 1644     Clinical Impression Statement Continued to progress VOR today to more compliant surface and continued to work on more dynamic standing balance/weight shifting and stepping  on uneven surfaces with and without vision.  Unable to provoke any symptoms of dizziness but pt continues to demonstrate imbalance on compliant surface and when vision removed.  Will continue to address and progress towards LTG.    Personal Factors and Comorbidities Comorbidity 3+;Fitness;Past/Current Experience    Comorbidities Allergies, asthma, breast CA with lumpectomy, HTN, hypothyroidism, h/o BPPV    Examination-Activity Limitations Bend;Locomotion Level;Reach Overhead;Stand    Examination-Participation Restrictions Cleaning;Laundry;Meal Prep    Rehab Potential Good    PT Frequency 2x / week    PT Duration 8 weeks    PT Treatment/Interventions ADLs/Self Care Home Management;Aquatic Therapy;Canalith Repostioning;Cryotherapy;Moist Heat;DME Instruction;Gait training;Stair training;Functional mobility training;Therapeutic activities;Therapeutic exercise;Balance training;Neuromuscular re-education;Patient/family education;Manual techniques;Passive range of motion;Dry needling;Vestibular    PT Next Visit Plan For Neck/Posture: manual therapy to help with R rotation; soft tissue work on upper trap; posterior chain strengthening - hip extension, thoracic extension, postural exercises, deep neck flexor strengthening.  Continue to work on [employer], standing balance on compliant surfaces, vision removed.  Gait without cane/SLS training.    Consulted and Agree with Plan of Care Patient             Patient will benefit from skilled therapeutic intervention in order to improve the following deficits and impairments:  Abnormal gait, Decreased balance, Decreased strength, Difficulty walking, Dizziness, Postural dysfunction, Pain, Decreased range of motion  Visit Diagnosis: Unsteadiness on feet  Difficulty in walking, not elsewhere classified  Dizziness and giddiness  Repeated falls     Problem List Patient Active Problem List   Diagnosis Date Noted   Encounter for competency evaluation  01/09/2021   SOB (shortness of breath) 12/06/2020   Benign paroxysmal positional vertigo 05/12/2020   Orthostatic hypotension 09/23/2019   Dizziness 09/17/2019   OAB (overactive bladder) 08/27/2019   Pain of left thumb 08/27/2019   Family history of factor V Leiden mutation 08/11/2019   Peripheral edema 08/11/2019   Encounter for well adult exam with abnormal findings 08/08/2019   Hypothyroidism    Hypertension    Asthma    Allergic rhinitis    hx: right breast cancer 11/25/2011    Rico Junker, PT, DPT 05/04/21    4:49 PM    [facility] [address] [place], [place], [address] Phone: [phone]   Fax:  [phone]  Name: CLEMIE GENERAL MRN: [id number] Date of Birth: 14-Jul-1935

## 2021-05-06 ENCOUNTER — Other Ambulatory Visit: Payer: Self-pay

## 2021-05-06 ENCOUNTER — Ambulatory Visit: Payer: Medicare Other | Admitting: Physical Therapy

## 2021-05-06 DIAGNOSIS — R2681 Unsteadiness on feet: Secondary | ICD-10-CM | POA: Diagnosis not present

## 2021-05-06 DIAGNOSIS — R42 Dizziness and giddiness: Secondary | ICD-10-CM

## 2021-05-06 DIAGNOSIS — R262 Difficulty in walking, not elsewhere classified: Secondary | ICD-10-CM

## 2021-05-06 DIAGNOSIS — M542 Cervicalgia: Secondary | ICD-10-CM

## 2021-05-06 DIAGNOSIS — R296 Repeated falls: Secondary | ICD-10-CM

## 2021-05-06 NOTE — Therapy (Signed)
[facility] [address] [place], [place], [address] Phone: [phone]   Fax:  [phone]  Physical Therapy Treatment  Patient Details  Name: Julia Norton MRN: [id number] Date of Birth: Dec 23, 1935 Referring Provider (PT): Melvenia Beam, MD   Encounter Date: 05/06/2021   PT End of Session - 05/06/21 1720     Visit Number 9    Number of Visits 17    Date for PT Re-Evaluation 05/25/21    Authorization Type [employer]    Progress Note Due on Visit 10    PT Start Time 1452    PT Stop Time 1532    PT Time Calculation (min) 40 min    Activity Tolerance Patient tolerated treatment well    Behavior During Therapy [facility] for tasks assessed/performed             Past Medical History:  Diagnosis Date   Allergic rhinitis    Asthma    Breast cancer ([place]) 1987   Erysipelas    Hemorrhoids    Hypertension    Hypothyroidism     Past Surgical History:  Procedure Laterality Date   BREAST LUMPECTOMY Right 1986   malignant   TONSILLECTOMY      There were no vitals filed for this visit.   Subjective Assessment - 05/06/21 1456     Subjective Neck was bothering her a lot this morning; not sure why.    Pertinent History Allergies, asthma, breast CA with lumpectomy, HTN, hypothyroidism, h/o BPPV    Limitations Standing;Walking    Patient Stated Goals To be back to normal; gain confidence to move around more.    Currently in Pain? Yes              [facility] Adult PT Treatment/Exercise - 05/06/21 1500       Manual Therapy   Manual Therapy Joint mobilization;Passive ROM;Manual Traction;Myofascial release;Muscle Energy Technique    Manual therapy comments Performed in supine; focused on rotation to R    Joint Mobilization R 1st rib depression mobilization with passive neck lateral flexion to R.  Performed suboccipital/upper cervical flexion and grade I/II AP mobilizations; added in progressive rotation to upper  cervical mobilizations.  With neck in 20-30 deg rotation, continued to provide Grade II upglides and downglides to upper and mid cervical spine to improve rotation ROM.    Myofascial Release continued to preform suboccipital release combined with manual traction to increase upper cervical ROM    Passive ROM Passive ROM into R rotation after performing 6 second contract-relax of R rotation; performed x 5 reps    Manual Traction combined with suboccipital release to increase upper cervical/suboccipitial ROM    Muscle Energy Technique R rotation contract relax with pt performing isometric activation against therapist's manual resistance x 6 second hold x 5 reps; able to progressively increase R rotation ROM               PT Education - 05/06/21 1720     Education Details encouraged pt to continue to perform R upper trap stretch and gentle AROM rotation to L and R    Person(s) Educated Patient    Methods Explanation    Comprehension Verbalized understanding              PT Short Term Goals - 04/22/21 1321       PT SHORT TERM GOAL #1   Title Pt will participate in more in depth vestibular re-assessment    Baseline had  vestibular assesment    Time 4    Period Weeks    Status Achieved    Target Date 04/25/21      PT SHORT TERM GOAL #2   Title Pt will initiate HEP focusing on LE strength, balance and neck ROM    Baseline HEP initaited and reviewed on 04/22/21    Time 4    Period Weeks    Status Achieved    Target Date 04/25/21      PT SHORT TERM GOAL #3   Title Pt will demonstrate decreased falls risk during transitions as indicated by decrease in five time sit to stand to </= 17 seconds demonstrating ability to shift COG fully over BOS without UE support    Baseline 21 seconds with UE touching arm rests through out sit <> stand; 18.19 secs with UE support, still challenge coming to upright position    Time 4    Period Weeks    Status Not Met    Target Date 04/25/21      PT  SHORT TERM GOAL #4   Title Pt will demonstrate decreased falls risk during ambulation as indicated by increase in DGI to >/= 16/24    Baseline 12/24; 14/24    Time 4    Period Weeks    Status Not Met    Target Date 04/25/21               PT Long Term Goals - 03/26/21 1300       PT LONG TERM GOAL #1   Title Pt will report improvement in DPS to >/= 58 and increase in DFS to >/= 52    Baseline DPS: 45; DFS: 48    Time 8    Period Weeks    Status New    Target Date 05/25/21      PT LONG TERM GOAL #2   Title Pt will demonstrate independence with final HEP    Time 8    Period Weeks    Status New    Target Date 05/25/21      PT LONG TERM GOAL #3   Title Pt will demonstrate improvement in DGI to >/= 20/24    Time 8    Period Weeks    Status New    Target Date 05/25/21      PT LONG TERM GOAL #4   Title Pt will report no symptoms of dizziness during sit > stand, bending forwards, performing head/body turns and tilting head back    Time 8    Period Weeks    Status New    Target Date 05/25/21      PT LONG TERM GOAL #5   Title Pt will decrease five time sit to stand to </= 17 seconds without the use of UE when performing sit > stand and stand > sit    Time 8    Period Weeks    Status New    Target Date 05/25/21      Additional Long Term Goals   Additional Long Term Goals Yes      PT LONG TERM GOAL #6   Title Pt will demonstrate ability to perform neck extension without pain and will demonstrate 10 deg improvement in neck lateral flexion and rotation bilaterally with 50% decreased pain    Baseline see flowsheet    Time 8    Period Weeks    Status New    Target Date 05/25/21  Plan - 05/06/21 1721     Clinical Impression Statement Continued to utilize manual therapy to continue to address neck pain and limitations in neck rotation ROM.  Following manual therapy, isometric activation and PROM, pt demonstrated increased active rotation to R  but continue to report some pain at end range.  Will continue to address.    Personal Factors and Comorbidities Comorbidity 3+;Fitness;Past/Current Experience    Comorbidities Allergies, asthma, breast CA with lumpectomy, HTN, hypothyroidism, h/o BPPV    Examination-Activity Limitations Bend;Locomotion Level;Reach Overhead;Stand    Examination-Participation Restrictions Cleaning;Laundry;Meal Prep    Rehab Potential Good    PT Frequency 2x / week    PT Duration 8 weeks    PT Treatment/Interventions ADLs/Self Care Home Management;Aquatic Therapy;Canalith Repostioning;Cryotherapy;Moist Heat;DME Instruction;Gait training;Stair training;Functional mobility training;Therapeutic activities;Therapeutic exercise;Balance training;Neuromuscular re-education;Patient/family education;Manual techniques;Passive range of motion;Dry needling;Vestibular    PT Next Visit Plan 10th visit PN.  For Neck/Posture: manual therapy to help with R rotation; soft tissue work on upper trap; posterior chain strengthening - hip extension, thoracic extension, postural exercises, deep neck flexor strengthening.  Continue to work on [employer], standing balance on compliant surfaces, vision removed.  Gait without cane/SLS training.    Consulted and Agree with Plan of Care Patient             Patient will benefit from skilled therapeutic intervention in order to improve the following deficits and impairments:  Abnormal gait, Decreased balance, Decreased strength, Difficulty walking, Dizziness, Postural dysfunction, Pain, Decreased range of motion  Visit Diagnosis: Unsteadiness on feet  Difficulty in walking, not elsewhere classified  Cervicalgia  Dizziness and giddiness  Repeated falls     Problem List Patient Active Problem List   Diagnosis Date Noted   Encounter for competency evaluation 01/09/2021   SOB (shortness of breath) 12/06/2020   Benign paroxysmal positional vertigo 05/12/2020   Orthostatic  hypotension 09/23/2019   Dizziness 09/17/2019   OAB (overactive bladder) 08/27/2019   Pain of left thumb 08/27/2019   Family history of factor V Leiden mutation 08/11/2019   Peripheral edema 08/11/2019   Encounter for well adult exam with abnormal findings 08/08/2019   Hypothyroidism    Hypertension    Asthma    Allergic rhinitis    hx: right breast cancer 11/25/2011    Rico Junker, PT, DPT 05/06/21    5:24 PM    [facility] [address] [place], [place], [address] Phone: [phone]   Fax:  [phone]  Name: Julia Norton MRN: [id number] Date of Birth: Feb 20, 1936

## 2021-05-11 ENCOUNTER — Ambulatory Visit: Payer: Medicare Other | Admitting: Physical Therapy

## 2021-05-11 ENCOUNTER — Other Ambulatory Visit: Payer: Self-pay

## 2021-05-11 DIAGNOSIS — R2681 Unsteadiness on feet: Secondary | ICD-10-CM

## 2021-05-11 DIAGNOSIS — R296 Repeated falls: Secondary | ICD-10-CM

## 2021-05-11 DIAGNOSIS — R42 Dizziness and giddiness: Secondary | ICD-10-CM

## 2021-05-11 DIAGNOSIS — R262 Difficulty in walking, not elsewhere classified: Secondary | ICD-10-CM

## 2021-05-12 NOTE — Therapy (Signed)
[facility] [address] [place], [place], [address] Phone: [phone]   Fax:  [phone]  Physical Therapy Treatment and 10th Visit Progress Note  Patient Details  Name: Julia Norton MRN: [id number] Date of Birth: 10-21-1935 Referring Provider (PT): Melvenia Beam, MD   Encounter Date: 05/11/2021  Progress Note Reporting Period 03/26/2021 to 05/11/2021  See note below for Objective Data and Assessment of Progress/Goals.    PT End of Session - 05/11/21 1538     Visit Number 10    Number of Visits 17    Date for PT Re-Evaluation 05/25/21    Authorization Type [employer]    Progress Note Due on Visit 10    PT Start Time 1450    PT Stop Time 1533    PT Time Calculation (min) 43 min    Activity Tolerance Patient tolerated treatment well    Behavior During Therapy WFL for tasks assessed/performed             Past Medical History:  Diagnosis Date   Allergic rhinitis    Asthma    Breast cancer ([place]) 1987   Erysipelas    Hemorrhoids    Hypertension    Hypothyroidism     Past Surgical History:  Procedure Laterality Date   BREAST LUMPECTOMY Right 1986   malignant   TONSILLECTOMY      There were no vitals filed for this visit.   Subjective Assessment - 05/11/21 1453     Subjective Neck is about the same.  Went to see orthopedic physician who recommended pt continue to work with PT.  No significant episode of dizziness since last session; feeling a little bit of dizziness today.    Pertinent History Allergies, asthma, breast CA with lumpectomy, HTN, hypothyroidism, h/o BPPV    Limitations Standing;Walking    Patient Stated Goals To be back to normal; gain confidence to move around more.    Currently in Pain? Yes    Pain Score 1     Pain Location Neck    Pain Orientation Right              [facility] Adult PT Treatment/Exercise - 05/11/21 1522       Ambulation/Gait   Ramp 5: Supervision    Ramp  Details (indicate cue type and reason) standing facing up ramp and then down ramp with compliant mat over ramp.  Performed 5 reps 3 way stepping with each LE forwards, lateral, retro without UE support.  Also facing up and down ramp performed staggered stance with each foot forwards.  Performed 10 reps head turns and head nods with each foot forwards, no UE support and no external support for balance.      Exercises   Exercises Knee/Hip      Knee/Hip Exercises: Standing   Lateral Step Up Right;Left;1 set;5 reps;Hand Hold: 0;Limitations    Lateral Step Up Limitations on blue foam, mild R hip pain with lateral stepping    Forward Step Up Right;Left;1 set;5 reps;Hand Hold: 0;Limitations    Forward Step Up Limitations on blue foam             Vestibular Treatment/Exercise - 05/11/21 1456       Vestibular Treatment/Exercise   Vestibular Treatment Provided Gaze    Gaze Exercises X1 Viewing Horizontal;X1 Viewing Vertical      X1 Viewing Horizontal   Foot Position standing on pillow, narrow BOS    Reps 2    Comments 60  seconds, added busy background with no change in symptoms.  No dizziness reported, only slight imbalance      X1 Viewing Vertical   Foot Position standing on pillow, narrow BOS    Reps 2    Comments 60 seconds, added busy background with no change in symptoms.  No dizziness reported, only slight imbalance                Balance Exercises - 05/11/21 1523       Balance Exercises: Standing   Sit to Stand Foam/compliant surface    Sit to Stand Limitations attempted sit > stand from mat but pt reported increased pain in L knee, changed to step ups                PT Education - 05/11/21 1538     Education Details No change to HEP today; advised pt to not perform step ups on compliant surface at home due to risk for catching foot and tripping    Person(s) Educated Patient    Methods Explanation    Comprehension Verbalized understanding               PT Short Term Goals - 04/22/21 1321       PT SHORT TERM GOAL #1   Title Pt will participate in more in depth vestibular re-assessment    Baseline had vestibular assesment    Time 4    Period Weeks    Status Achieved    Target Date 04/25/21      PT SHORT TERM GOAL #2   Title Pt will initiate HEP focusing on LE strength, balance and neck ROM    Baseline HEP initaited and reviewed on 04/22/21    Time 4    Period Weeks    Status Achieved    Target Date 04/25/21      PT SHORT TERM GOAL #3   Title Pt will demonstrate decreased falls risk during transitions as indicated by decrease in five time sit to stand to </= 17 seconds demonstrating ability to shift COG fully over BOS without UE support    Baseline 21 seconds with UE touching arm rests through out sit <> stand; 18.19 secs with UE support, still challenge coming to upright position    Time 4    Period Weeks    Status Not Met    Target Date 04/25/21      PT SHORT TERM GOAL #4   Title Pt will demonstrate decreased falls risk during ambulation as indicated by increase in DGI to >/= 16/24    Baseline 12/24; 14/24    Time 4    Period Weeks    Status Not Met    Target Date 04/25/21               PT Long Term Goals - 03/26/21 1300       PT LONG TERM GOAL #1   Title Pt will report improvement in DPS to >/= 58 and increase in DFS to >/= 52    Baseline DPS: 45; DFS: 48    Time 8    Period Weeks    Status New    Target Date 05/25/21      PT LONG TERM GOAL #2   Title Pt will demonstrate independence with final HEP    Time 8    Period Weeks    Status New    Target Date 05/25/21      PT LONG TERM GOAL #3  Title Pt will demonstrate improvement in DGI to >/= 20/24    Time 8    Period Weeks    Status New    Target Date 05/25/21      PT LONG TERM GOAL #4   Title Pt will report no symptoms of dizziness during sit > stand, bending forwards, performing head/body turns and tilting head back    Time 8    Period Weeks     Status New    Target Date 05/25/21      PT LONG TERM GOAL #5   Title Pt will decrease five time sit to stand to </= 17 seconds without the use of UE when performing sit > stand and stand > sit    Time 8    Period Weeks    Status New    Target Date 05/25/21      Additional Long Term Goals   Additional Long Term Goals Yes      PT LONG TERM GOAL #6   Title Pt will demonstrate ability to perform neck extension without pain and will demonstrate 10 deg improvement in neck lateral flexion and rotation bilaterally with 50% decreased pain    Baseline see flowsheet    Time 8    Period Weeks    Status New    Target Date 05/25/21                   Plan - 05/11/21 1539     Clinical Impression Statement Pt reporting mild dizziness at beginning of session but did not report increase in dizziness with gaze adaptation exercises.  Pt also demonstrated improved static and dynamic standing balance on compliant surfaces today with decreased assistance required by PT to maintain balance when EO and with EC.  Pt limited in ability to perform sit <> stand and step ups today due to knee and hip pain.  Pt is making good progress towards LTG.    Personal Factors and Comorbidities Comorbidity 3+;Fitness;Past/Current Experience    Comorbidities Allergies, asthma, breast CA with lumpectomy, HTN, hypothyroidism, h/o BPPV    Examination-Activity Limitations Bend;Locomotion Level;Reach Overhead;Stand    Examination-Participation Restrictions Cleaning;Laundry;Meal Prep    Rehab Potential Good    PT Frequency 2x / week    PT Duration 8 weeks    PT Treatment/Interventions ADLs/Self Care Home Management;Aquatic Therapy;Canalith Repostioning;Cryotherapy;Moist Heat;DME Instruction;Gait training;Stair training;Functional mobility training;Therapeutic activities;Therapeutic exercise;Balance training;Neuromuscular re-education;Patient/family education;Manual techniques;Passive range of motion;Dry needling;Vestibular     PT Next Visit Plan For Neck/Posture: manual therapy to help with R rotation; soft tissue work on upper trap; posterior chain strengthening - hip extension, thoracic extension, postural exercises, deep neck flexor strengthening.  Continue to work on [employer], standing balance on compliant surfaces, vision removed.  Gait without cane/SLS training.    Consulted and Agree with Plan of Care Patient             Patient will benefit from skilled therapeutic intervention in order to improve the following deficits and impairments:  Abnormal gait, Decreased balance, Decreased strength, Difficulty walking, Dizziness, Postural dysfunction, Pain, Decreased range of motion  Visit Diagnosis: Unsteadiness on feet  Difficulty in walking, not elsewhere classified  Dizziness and giddiness  Repeated falls     Problem List Patient Active Problem List   Diagnosis Date Noted   Encounter for competency evaluation 01/09/2021   SOB (shortness of breath) 12/06/2020   Benign paroxysmal positional vertigo 05/12/2020   Orthostatic hypotension 09/23/2019   Dizziness 09/17/2019   OAB (  overactive bladder) 08/27/2019   Pain of left thumb 08/27/2019   Family history of factor V Leiden mutation 08/11/2019   Peripheral edema 08/11/2019   Encounter for well adult exam with abnormal findings 08/08/2019   Hypothyroidism    Hypertension    Asthma    Allergic rhinitis    hx: right breast cancer 11/25/2011    Rico Junker, PT, DPT 05/12/21    10:45 AM   [facility] [address] [place], [place], [address] Phone: [phone]   Fax:  [phone]  Name: Julia Norton MRN: [id number] Date of Birth: 01-16-1936

## 2021-05-13 ENCOUNTER — Other Ambulatory Visit: Payer: Self-pay

## 2021-05-13 ENCOUNTER — Ambulatory Visit: Payer: Medicare Other | Admitting: Physical Therapy

## 2021-05-13 DIAGNOSIS — R2681 Unsteadiness on feet: Secondary | ICD-10-CM | POA: Diagnosis not present

## 2021-05-13 DIAGNOSIS — M542 Cervicalgia: Secondary | ICD-10-CM

## 2021-05-13 DIAGNOSIS — R42 Dizziness and giddiness: Secondary | ICD-10-CM

## 2021-05-13 NOTE — Therapy (Signed)
[facility] [address] [place], [place], [address] Phone: [phone]   Fax:  [phone]  Physical Therapy Treatment  Patient Details  Name: Julia Norton MRN: [id number] Date of Birth: [birth year] Referring Provider (PT): Melvenia Beam, MD   Encounter Date: 05/13/2021   PT End of Session - 05/13/21 2016     Visit Number 11    Number of Visits 17    Date for PT Re-Evaluation 05/25/21    Authorization Type U[employer]    Progress Note Due on Visit 20    PT Start Time 1455    PT Stop Time 1531    PT Time Calculation (min) 36 min    Activity Tolerance Patient tolerated treatment well    Behavior During Therapy [facility] for tasks assessed/performed             Past Medical History:  Diagnosis Date   Allergic rhinitis    Asthma    Breast cancer ([place]) 1987   Erysipelas    Hemorrhoids    Hypertension    Hypothyroidism     Past Surgical History:  Procedure Laterality Date   BREAST LUMPECTOMY Right 1986   malignant   TONSILLECTOMY      There were no vitals filed for this visit.   Subjective Assessment - 05/13/21 1457     Subjective No increased dizziness or imbalance after last session.  Hip and knee recovered from last session.  Neck still having pain with R rotation; pain is at mid cervical level on R side.    Pertinent History Allergies, asthma, breast CA with lumpectomy, HTN, hypothyroidism, h/o BPPV    Limitations Standing;Walking    Patient Stated Goals To be back to normal; gain confidence to move around more.    Currently in Pain? No/denies              [facility] Adult PT Treatment/Exercise - 05/13/21 1459       Exercises   Exercises Shoulder      Neck Exercises: Supine   Cervical Isometrics Right rotation;Left rotation;5 secs;5 reps    Cervical Isometrics Limitations gradually progressing AROM to each side and performing isometric activation at that range    Neck Retraction 10 reps;5  secs;Limitations    Neck Retraction Limitations deep neck flexor training with PT providing facilitation at upper cervical spine and forehead to minimize excessive extension or flexion    Capital Flexion 10 reps;Limitations    Capital Flexion Limitations with PT manually blocking movement below C2; also performed neck rotation to L and R and then adding in capital flexion with rotation x 10 reps each side      Shoulder Exercises: Supine   Flexion Strengthening;Both;10 reps;Weights    Shoulder Flexion Weight (lbs) 6    Flexion Limitations held 6lb weight with bilat UE and performed bilat overhead flexion combined with inhalation and LE bridge for posterior chain strengthening and thoracic extension      Manual Therapy   Manual Therapy Joint mobilization;Passive ROM;Manual Traction;Myofascial release    Manual therapy comments Performed in supine; focused on rotation to R    Joint Mobilization R 1st rib depression mobilization with passive neck lateral flexion to R.  With neck in 20-30 deg R rotation, continued to provide Grade II upglides and downglides to upper and mid cervical spine to improve rotation ROM.    Myofascial Release continued to preform suboccipital release combined with manual traction to increase upper cervical ROM  Passive ROM PROM to R upper trapezius x 2 reps x 60 seconds    Manual Traction combined with suboccipital release to increase upper cervical/suboccipitial ROM                PT Short Term Goals - 04/22/21 1321       PT SHORT TERM GOAL #1   Title Pt will participate in more in depth vestibular re-assessment    Baseline had vestibular assesment    Time 4    Period Weeks    Status Achieved    Target Date 04/25/21      PT SHORT TERM GOAL #2   Title Pt will initiate HEP focusing on LE strength, balance and neck ROM    Baseline HEP initaited and reviewed on 04/22/21    Time 4    Period Weeks    Status Achieved    Target Date 04/25/21      PT SHORT  TERM GOAL #3   Title Pt will demonstrate decreased falls risk during transitions as indicated by decrease in five time sit to stand to </= 17 seconds demonstrating ability to shift COG fully over BOS without UE support    Baseline 21 seconds with UE touching arm rests through out sit <> stand; 18.19 secs with UE support, still challenge coming to upright position    Time 4    Period Weeks    Status Not Met    Target Date 04/25/21      PT SHORT TERM GOAL #4   Title Pt will demonstrate decreased falls risk during ambulation as indicated by increase in DGI to >/= 16/24    Baseline 12/24; 14/24    Time 4    Period Weeks    Status Not Met    Target Date 04/25/21               PT Long Term Goals - 03/26/21 1300       PT LONG TERM GOAL #1   Title Pt will report improvement in DPS to >/= 58 and increase in DFS to >/= 52    Baseline DPS: 45; DFS: 48    Time 8    Period Weeks    Status New    Target Date 05/25/21      PT LONG TERM GOAL #2   Title Pt will demonstrate independence with final HEP    Time 8    Period Weeks    Status New    Target Date 05/25/21      PT LONG TERM GOAL #3   Title Pt will demonstrate improvement in DGI to >/= 20/24    Time 8    Period Weeks    Status New    Target Date 05/25/21      PT LONG TERM GOAL #4   Title Pt will report no symptoms of dizziness during sit > stand, bending forwards, performing head/body turns and tilting head back    Time 8    Period Weeks    Status New    Target Date 05/25/21      PT LONG TERM GOAL #5   Title Pt will decrease five time sit to stand to </= 17 seconds without the use of UE when performing sit > stand and stand > sit    Time 8    Period Weeks    Status New    Target Date 05/25/21      Additional Long Term Goals   Additional  Long Term Goals Yes      PT LONG TERM GOAL #6   Title Pt will demonstrate ability to perform neck extension without pain and will demonstrate 10 deg improvement in neck lateral  flexion and rotation bilaterally with 50% decreased pain    Baseline see flowsheet    Time 8    Period Weeks    Status New    Target Date 05/25/21             Plan - 05/13/21 2017     Clinical Impression Statement Continued to utilize manual therapy to continue to address neck pain and limitations in neck rotation ROM. Following manual therapy, isometric activation and PROM, pt demonstrated increased active rotation to R and reported improvement in pain at end range.  Described more as a "strain" or stretch at end range instead of sharp pain.  Will continue to address and progress towards LTG.    Personal Factors and Comorbidities Comorbidity 3+;Fitness;Past/Current Experience    Comorbidities Allergies, asthma, breast CA with lumpectomy, HTN, hypothyroidism, h/o BPPV    Examination-Activity Limitations Bend;Locomotion Level;Reach Overhead;Stand    Examination-Participation Restrictions Cleaning;Laundry;Meal Prep    Rehab Potential Good    PT Frequency 2x / week    PT Duration 8 weeks    PT Treatment/Interventions ADLs/Self Care Home Management;Aquatic Therapy;Canalith Repostioning;Cryotherapy;Moist Heat;DME Instruction;Gait training;Stair training;Functional mobility training;Therapeutic activities;Therapeutic exercise;Balance training;Neuromuscular re-education;Patient/family education;Manual techniques;Passive range of motion;Dry needling;Vestibular    PT Next Visit Plan For Neck/Posture: manual therapy to help with R rotation; soft tissue work on L upper trap; posterior chain strengthening - hip extension, thoracic extension, postural exercises, deep neck flexor strengthening.  Continue to work on [employer], standing balance on compliant surfaces, vision removed.  Gait without cane/SLS training.    Consulted and Agree with Plan of Care Patient             Patient will benefit from skilled therapeutic intervention in order to improve the following deficits and impairments:   Abnormal gait, Decreased balance, Decreased strength, Difficulty walking, Dizziness, Postural dysfunction, Pain, Decreased range of motion  Visit Diagnosis: Cervicalgia  Dizziness and giddiness     Problem List Patient Active Problem List   Diagnosis Date Noted   Encounter for competency evaluation 01/09/2021   SOB (shortness of breath) 12/06/2020   Benign paroxysmal positional vertigo 05/12/2020   Orthostatic hypotension 09/23/2019   Dizziness 09/17/2019   OAB (overactive bladder) 08/27/2019   Pain of left thumb 08/27/2019   Family history of factor V Leiden mutation 08/11/2019   Peripheral edema 08/11/2019   Encounter for well adult exam with abnormal findings 08/08/2019   Hypothyroidism    Hypertension    Asthma    Allergic rhinitis    hx: right breast cancer 11/25/2011    Rico Junker, PT, DPT 05/13/21    8:19 PM    [facility] [address] [place], [place], [address] Phone: [phone]   Fax:  [phone]  Name: Julia Norton MRN: [id number] Date of Birth: 11/05/1935

## 2021-05-18 ENCOUNTER — Other Ambulatory Visit: Payer: Self-pay

## 2021-05-18 ENCOUNTER — Ambulatory Visit: Payer: Medicare Other | Admitting: Physical Therapy

## 2021-05-18 DIAGNOSIS — R2681 Unsteadiness on feet: Secondary | ICD-10-CM | POA: Diagnosis not present

## 2021-05-18 DIAGNOSIS — R262 Difficulty in walking, not elsewhere classified: Secondary | ICD-10-CM

## 2021-05-18 DIAGNOSIS — R296 Repeated falls: Secondary | ICD-10-CM

## 2021-05-18 DIAGNOSIS — R42 Dizziness and giddiness: Secondary | ICD-10-CM

## 2021-05-18 NOTE — Therapy (Signed)
[facility] [address] [place], [place], [address] Phone: [phone]   Fax:  [phone]  Physical Therapy Treatment  Patient Details  Name: Julia Norton MRN: [id number] Date of Birth: [birth year] Referring Provider (PT): Melvenia Beam, MD   Encounter Date: 05/18/2021   PT End of Session - 05/18/21 1558     Visit Number 12    Number of Visits 17    Date for PT Re-Evaluation 05/25/21    Authorization Type UHC    Progress Note Due on Visit 20    PT Start Time 1401    PT Stop Time 1446    PT Time Calculation (min) 45 min    Activity Tolerance Patient tolerated treatment well    Behavior During Therapy [facility] for tasks assessed/performed             Past Medical History:  Diagnosis Date   Allergic rhinitis    Asthma    Breast cancer ([place]) 1987   Erysipelas    Hemorrhoids    Hypertension    Hypothyroidism     Past Surgical History:  Procedure Laterality Date   BREAST LUMPECTOMY Right 1986   malignant   TONSILLECTOMY      There were no vitals filed for this visit.   Subjective Assessment - 05/18/21 1407     Subjective Pt reports continued pain on R side of neck, especially with continuous turning/looking to the R.  L knee was very painful this morning when she first got up but is better now.  Had some dizziness yesterday; came on later in the morning and then cleared up later in the day.    Pertinent History Allergies, asthma, breast CA with lumpectomy, HTN, hypothyroidism, h/o BPPV    Limitations Standing;Walking    Patient Stated Goals To be back to normal; gain confidence to move around more.    Currently in Pain? Yes               Balance Exercises - 05/18/21 1425       Balance Exercises: Standing   Stepping Strategy Anterior;Posterior;Foam/compliant surface;10 reps    Stepping Strategy Limitations standing across balance beam, stepping forward then back off beam returning to  beam in between without UE support.  R foot noted to catch beam when stepping forwards - focused on SLS on L and full R hip and knee flexion to clear beam.  When stepping with LLE, PT assisted with full weight shift to RLE.    Balance Beam Standing across balance beam: reaching down to same side to pick up and then place 2 cones to each side x 3 sets.  Also performed 3 sets x 2 reps reaching down and across midline for cones on opposite side, picking up and the putting back down on ground.  Intermittent min A if pt experienced LOB posteriorly.  Continued to stand across balance beam with wide BOS and performing transverse plane rotations of eyes, head, neck and trunk to hand ball to PT behind pt and then rotate to other side to retrieve ball, continuously x 10 reps.  Changed to passing the ball from up/right<>down/left x 10 and up/left <> down/right x 10 with mild dizziness and intermittent min A for balance with posterior LOB.    Gait with Head Turns Forward;5 reps;Limitations    Gait with Head Turns Limitations walking forwards without cane performing alternating head turns to L and R looking for, retrieving ball or passing it back  to therapist.  PT varied height of ball.  Cues to look with eyes and head turn first and then reaching for ball or hand.                  PT Short Term Goals - 04/22/21 1321       PT SHORT TERM GOAL #1   Title Pt will participate in more in depth vestibular re-assessment    Baseline had vestibular assesment    Time 4    Period Weeks    Status Achieved    Target Date 04/25/21      PT SHORT TERM GOAL #2   Title Pt will initiate HEP focusing on LE strength, balance and neck ROM    Baseline HEP initaited and reviewed on 04/22/21    Time 4    Period Weeks    Status Achieved    Target Date 04/25/21      PT SHORT TERM GOAL #3   Title Pt will demonstrate decreased falls risk during transitions as indicated by decrease in five time sit to stand to </= 17 seconds  demonstrating ability to shift COG fully over BOS without UE support    Baseline 21 seconds with UE touching arm rests through out sit <> stand; 18.19 secs with UE support, still challenge coming to upright position    Time 4    Period Weeks    Status Not Met    Target Date 04/25/21      PT SHORT TERM GOAL #4   Title Pt will demonstrate decreased falls risk during ambulation as indicated by increase in DGI to >/= 16/24    Baseline 12/24; 14/24    Time 4    Period Weeks    Status Not Met    Target Date 04/25/21               PT Long Term Goals - 03/26/21 1300       PT LONG TERM GOAL #1   Title Pt will report improvement in DPS to >/= 58 and increase in DFS to >/= 52    Baseline DPS: 45; DFS: 48    Time 8    Period Weeks    Status New    Target Date 05/25/21      PT LONG TERM GOAL #2   Title Pt will demonstrate independence with final HEP    Time 8    Period Weeks    Status New    Target Date 05/25/21      PT LONG TERM GOAL #3   Title Pt will demonstrate improvement in DGI to >/= 20/24    Time 8    Period Weeks    Status New    Target Date 05/25/21      PT LONG TERM GOAL #4   Title Pt will report no symptoms of dizziness during sit > stand, bending forwards, performing head/body turns and tilting head back    Time 8    Period Weeks    Status New    Target Date 05/25/21      PT LONG TERM GOAL #5   Title Pt will decrease five time sit to stand to </= 17 seconds without the use of UE when performing sit > stand and stand > sit    Time 8    Period Weeks    Status New    Target Date 05/25/21      Additional Long Term Goals   Additional  Long Term Goals Yes      PT LONG TERM GOAL #6   Title Pt will demonstrate ability to perform neck extension without pain and will demonstrate 10 deg improvement in neck lateral flexion and rotation bilaterally with 50% decreased pain    Baseline see flowsheet    Time 8    Period Weeks    Status New    Target Date 05/25/21                Plan - 05/18/21 1559     Clinical Impression Statement Despite pt experiencing increased knee pain this morning pt was able to better tolerate dynamic and standing balance on compliant surface today; no reports of knee pain.  With repeated bending forwards and rotation, PT was able to elicit mild symptoms of dizziness.  Pt did not report any neck pain with head rotation today.  Will continue to address and progress towards LTG.    Personal Factors and Comorbidities Comorbidity 3+;Fitness;Past/Current Experience    Comorbidities Allergies, asthma, breast CA with lumpectomy, HTN, hypothyroidism, h/o BPPV    Examination-Activity Limitations Bend;Locomotion Level;Reach Overhead;Stand    Examination-Participation Restrictions Cleaning;Laundry;Meal Prep    Rehab Potential Good    PT Frequency 2x / week    PT Duration 8 weeks    PT Treatment/Interventions ADLs/Self Care Home Management;Aquatic Therapy;Canalith Repostioning;Cryotherapy;Moist Heat;DME Instruction;Gait training;Stair training;Functional mobility training;Therapeutic activities;Therapeutic exercise;Balance training;Neuromuscular re-education;Patient/family education;Manual techniques;Passive range of motion;Dry needling;Vestibular    PT Next Visit Plan Next week - check goals, more visits or ready for D/C?  For Neck/Posture: manual therapy to help with R rotation; soft tissue work on L upper trap; posterior chain strengthening - hip extension, thoracic extension, postural exercises, deep neck flexor strengthening.  Continue to work on [employer], standing balance on compliant surfaces, vision removed.    Consulted and Agree with Plan of Care Patient             Patient will benefit from skilled therapeutic intervention in order to improve the following deficits and impairments:  Abnormal gait, Decreased balance, Decreased strength, Difficulty walking, Dizziness, Postural dysfunction, Pain, Decreased range of  motion  Visit Diagnosis: Dizziness and giddiness  Unsteadiness on feet  Difficulty in walking, not elsewhere classified  Repeated falls     Problem List Patient Active Problem List   Diagnosis Date Noted   Encounter for competency evaluation 01/09/2021   SOB (shortness of breath) 12/06/2020   Benign paroxysmal positional vertigo 05/12/2020   Orthostatic hypotension 09/23/2019   Dizziness 09/17/2019   OAB (overactive bladder) 08/27/2019   Pain of left thumb 08/27/2019   Family history of factor V Leiden mutation 08/11/2019   Peripheral edema 08/11/2019   Encounter for well adult exam with abnormal findings 08/08/2019   Hypothyroidism    Hypertension    Asthma    Allergic rhinitis    hx: right breast cancer 11/25/2011    Rico Junker, PT, DPT 05/18/21    4:05 PM    [facility] [address] [place], [place], [address] Phone: [phone]   Fax:  [phone]  Name: Julia Norton MRN: [id number] Date of Birth: 06-Oct-1935

## 2021-05-20 ENCOUNTER — Ambulatory Visit: Payer: Medicare Other | Admitting: Physical Therapy

## 2021-05-24 ENCOUNTER — Ambulatory Visit: Payer: Medicare Other | Attending: Internal Medicine | Admitting: Physical Therapy

## 2021-05-24 ENCOUNTER — Other Ambulatory Visit: Payer: Self-pay

## 2021-05-24 DIAGNOSIS — R42 Dizziness and giddiness: Secondary | ICD-10-CM | POA: Insufficient documentation

## 2021-05-24 DIAGNOSIS — R262 Difficulty in walking, not elsewhere classified: Secondary | ICD-10-CM | POA: Insufficient documentation

## 2021-05-24 DIAGNOSIS — R296 Repeated falls: Secondary | ICD-10-CM | POA: Insufficient documentation

## 2021-05-24 DIAGNOSIS — M542 Cervicalgia: Secondary | ICD-10-CM | POA: Diagnosis not present

## 2021-05-24 DIAGNOSIS — R2681 Unsteadiness on feet: Secondary | ICD-10-CM | POA: Diagnosis present

## 2021-05-24 NOTE — Therapy (Signed)
[facility] [address] [place], [place], [address] Phone: [phone]   Fax:  [phone]  Physical Therapy Treatment  Patient Details  Name: Julia Norton MRN: [id number] Date of Birth: 05-19-1935 Referring Provider (PT): Melvenia Beam, MD   Encounter Date: 05/24/2021   PT End of Session - 05/24/21 2034     Visit Number 13    Number of Visits 17    Date for PT Re-Evaluation 05/25/21    Authorization Type UHC    Progress Note Due on Visit 20    PT Start Time 1318    PT Stop Time 1400    PT Time Calculation (min) 42 min    Activity Tolerance Patient tolerated treatment well    Behavior During Therapy [facility] for tasks assessed/performed             Past Medical History:  Diagnosis Date   Allergic rhinitis    Asthma    Breast cancer ([place]) 1987   Erysipelas    Hemorrhoids    Hypertension    Hypothyroidism     Past Surgical History:  Procedure Laterality Date   BREAST LUMPECTOMY Right 1986   malignant   TONSILLECTOMY      There were no vitals filed for this visit.   Subjective Assessment - 05/24/21 1320     Subjective Had dental emergency on Thursday and had to have a crown.  Pt feeling more dizziness today but got out of the car without her cane without thinking about it.  Neck is sore and needs attention today.    Pertinent History Allergies, asthma, breast CA with lumpectomy, HTN, hypothyroidism, h/o BPPV    Limitations Standing;Walking    Patient Stated Goals To be back to normal; gain confidence to move around more.    Currently in Pain? Yes    Pain Score 5     Pain Location Neck                [facility] PT Assessment - 05/24/21 1355       AROM   Cervical - Right Rotation 55    Cervical - Left Rotation 55      Palpation   Palpation comment Continues to have myofascial trigger point in R levator mm               [facility] Adult PT Treatment/Exercise - 05/24/21 2035        Neck Exercises: Supine   Neck Retraction 10 reps;5 secs;Limitations    Neck Retraction Limitations deep neck flexor training with PT providing cues to use downward gaze and tactile cues to facilitate correct activation      Neck Exercises: Stretches   Levator Stretch Right;1 rep;Limitations    Levator Stretch Limitations seated; pt attempted to perform but unable to keep scapula stabilized and depressed when performing rotation and flexion away from R side      Manual Therapy   Manual Therapy Joint mobilization;Soft tissue mobilization;Myofascial release;Manual Traction    Manual therapy comments Performed in supine; focused on both rotation to R and L today - upper cervical rotation and lower cervical rotation    Joint Mobilization Focused on capital rotation to L and R - performed AP mobilizations (pressing down through forehead with chin tucked) with head in midline and then with capital rotation to L and R.  With neck in 20-30 deg R rotation and then in L rotation, continued to provide Grade II upglides and downglides to  upper and mid cervical spine to improve rotation ROM.    Soft tissue mobilization STM to R and L upper trap; no significant trigger points noted in UT today.  Pt did report painful trigger point in R levator mm    Myofascial Release continued to preform suboccipital release combined with manual traction to increase upper cervical ROM    Manual Traction combined with suboccipital release to increase upper cervical/suboccipitial ROM                     PT Education - 05/24/21 2034     Education Details plan to check LTG next session; improvement in neck rotation ROM    Person(s) Educated Patient    Methods Explanation    Comprehension Verbalized understanding              PT Short Term Goals - 04/22/21 1321       PT SHORT TERM GOAL #1   Title Pt will participate in more in depth vestibular re-assessment    Baseline had vestibular assesment    Time 4     Period Weeks    Status Achieved    Target Date 04/25/21      PT SHORT TERM GOAL #2   Title Pt will initiate HEP focusing on LE strength, balance and neck ROM    Baseline HEP initaited and reviewed on 04/22/21    Time 4    Period Weeks    Status Achieved    Target Date 04/25/21      PT SHORT TERM GOAL #3   Title Pt will demonstrate decreased falls risk during transitions as indicated by decrease in five time sit to stand to </= 17 seconds demonstrating ability to shift COG fully over BOS without UE support    Baseline 21 seconds with UE touching arm rests through out sit <> stand; 18.19 secs with UE support, still challenge coming to upright position    Time 4    Period Weeks    Status Not Met    Target Date 04/25/21      PT SHORT TERM GOAL #4   Title Pt will demonstrate decreased falls risk during ambulation as indicated by increase in DGI to >/= 16/24    Baseline 12/24; 14/24    Time 4    Period Weeks    Status Not Met    Target Date 04/25/21               PT Long Term Goals - 03/26/21 1300       PT LONG TERM GOAL #1   Title Pt will report improvement in DPS to >/= 58 and increase in DFS to >/= 52    Baseline DPS: 45; DFS: 48    Time 8    Period Weeks    Status New    Target Date 05/25/21      PT LONG TERM GOAL #2   Title Pt will demonstrate independence with final HEP    Time 8    Period Weeks    Status New    Target Date 05/25/21      PT LONG TERM GOAL #3   Title Pt will demonstrate improvement in DGI to >/= 20/24    Time 8    Period Weeks    Status New    Target Date 05/25/21      PT LONG TERM GOAL #4   Title Pt will report no symptoms of dizziness during sit >  stand, bending forwards, performing head/body turns and tilting head back    Time 8    Period Weeks    Status New    Target Date 05/25/21      PT LONG TERM GOAL #5   Title Pt will decrease five time sit to stand to </= 17 seconds without the use of UE when performing sit > stand and  stand > sit    Time 8    Period Weeks    Status New    Target Date 05/25/21      Additional Long Term Goals   Additional Long Term Goals Yes      PT LONG TERM GOAL #6   Title Pt will demonstrate ability to perform neck extension without pain and will demonstrate 10 deg improvement in neck lateral flexion and rotation bilaterally with 50% decreased pain    Baseline see flowsheet    Time 8    Period Weeks    Status New    Target Date 05/25/21                   Plan - 05/24/21 2053     Clinical Impression Statement Pt is responding well to manual therapy and demonstrates equal neck rotation ROM between L and R.  Pt continues to report painful trigger point in R levator scapula muscle which PT will continue to address.  Will assess progress towards LTG next session.    Personal Factors and Comorbidities Comorbidity 3+;Fitness;Past/Current Experience    Comorbidities Allergies, asthma, breast CA with lumpectomy, HTN, hypothyroidism, h/o BPPV    Examination-Activity Limitations Bend;Locomotion Level;Reach Overhead;Stand    Examination-Participation Restrictions Cleaning;Laundry;Meal Prep    Rehab Potential Good    PT Frequency 2x / week    PT Duration 8 weeks    PT Treatment/Interventions ADLs/Self Care Home Management;Aquatic Therapy;Canalith Repostioning;Cryotherapy;Moist Heat;DME Instruction;Gait training;Stair training;Functional mobility training;Therapeutic activities;Therapeutic exercise;Balance training;Neuromuscular re-education;Patient/family education;Manual techniques;Passive range of motion;Dry needling;Vestibular    PT Next Visit Plan Check LTG and recert?  Address R levator trigger points.  For Neck/Posture: manual therapy to help with R rotation; soft tissue work on L upper trap; posterior chain strengthening - hip extension, thoracic extension, postural exercises, deep neck flexor strengthening.  Continue to work on [employer], standing balance on compliant surfaces,  vision removed.    Consulted and Agree with Plan of Care Patient             Patient will benefit from skilled therapeutic intervention in order to improve the following deficits and impairments:  Abnormal gait, Decreased balance, Decreased strength, Difficulty walking, Dizziness, Postural dysfunction, Pain, Decreased range of motion  Visit Diagnosis: Cervicalgia     Problem List Patient Active Problem List   Diagnosis Date Noted   Encounter for competency evaluation 01/09/2021   SOB (shortness of breath) 12/06/2020   Benign paroxysmal positional vertigo 05/12/2020   Orthostatic hypotension 09/23/2019   Dizziness 09/17/2019   OAB (overactive bladder) 08/27/2019   Pain of left thumb 08/27/2019   Family history of factor V Leiden mutation 08/11/2019   Peripheral edema 08/11/2019   Encounter for well adult exam with abnormal findings 08/08/2019   Hypothyroidism    Hypertension    Asthma    Allergic rhinitis    hx: right breast cancer 11/25/2011    Dierdre Highman, PT, DPT 05/24/21    8:57 PM    [facility] [address] [place], [place], [address] Phone: [phone]  Fax:  [phone]  Name: Julia Norton MRN: [id number] Date of Birth: 11/17/1935

## 2021-05-27 ENCOUNTER — Other Ambulatory Visit: Payer: Self-pay

## 2021-05-27 ENCOUNTER — Ambulatory Visit: Payer: Medicare Other | Admitting: Physical Therapy

## 2021-05-27 DIAGNOSIS — M542 Cervicalgia: Secondary | ICD-10-CM

## 2021-05-27 DIAGNOSIS — R262 Difficulty in walking, not elsewhere classified: Secondary | ICD-10-CM

## 2021-05-27 DIAGNOSIS — R2681 Unsteadiness on feet: Secondary | ICD-10-CM

## 2021-05-27 DIAGNOSIS — R42 Dizziness and giddiness: Secondary | ICD-10-CM

## 2021-05-27 DIAGNOSIS — R296 Repeated falls: Secondary | ICD-10-CM

## 2021-05-27 NOTE — Therapy (Signed)
[facility] [address] [place], [place], [address] Phone: [phone]   Fax:  [phone]  Physical Therapy Treatment  Patient Details  Name: Julia Norton MRN: [id number] Date of Birth: Dec 24, 1935 Referring Provider (PT): Melvenia Beam, MD   Encounter Date: 05/27/2021   PT End of Session - 05/27/21 2026     Visit Number 14    Number of Visits 22    Date for PT Re-Evaluation 07/26/21    Authorization Type UHC    Progress Note Due on Visit 20    PT Start Time 1315    PT Stop Time 1403    PT Time Calculation (min) 48 min    Activity Tolerance Patient tolerated treatment well    Behavior During Therapy [facility] for tasks assessed/performed             Past Medical History:  Diagnosis Date   Allergic rhinitis    Asthma    Breast cancer ([place]) 1987   Erysipelas    Hemorrhoids    Hypertension    Hypothyroidism     Past Surgical History:  Procedure Laterality Date   BREAST LUMPECTOMY Right 1986   malignant   TONSILLECTOMY      There were no vitals filed for this visit.   Subjective Assessment - 05/27/21 1320     Subjective Neck was very sore after last session but better today.    Pertinent History Allergies, asthma, breast CA with lumpectomy, HTN, hypothyroidism, h/o BPPV    Limitations Standing;Walking    Patient Stated Goals To be back to normal; gain confidence to move around more.    Currently in Pain? No/denies                [facility] PT Assessment - 05/27/21 1322       Assessment   Medical Diagnosis BPPV    Referring Provider (PT) Melvenia Beam, MD    Onset Date/Surgical Date 03/23/21    Prior Therapy yes for dizziness      Precautions   Precautions Other (comment)    Precaution Comments Allergies, asthma, breast CA with lumpectomy, HTN, hypothyroidism, h/o BPPV      Prior Function   Level of Independence Independent      Observation/Other Assessments   Focus on  Therapeutic Outcomes (FOTO)  DPS: 60; DFS: 52.3      AROM   Overall AROM  Deficits    AROM Assessment Site Cervical    Cervical Flexion 60    Cervical Extension 40    Cervical - Right Side Bend 35    Cervical - Left Side Bend 30    Cervical - Right Rotation 55    Cervical - Left Rotation 55      Standardized Balance Assessment   Standardized Balance Assessment Five Times Sit to Stand;Dynamic Gait Index    Five times sit to stand comments  14.19 seconds with UE support from mat, mild dizziness performing sit <> stand at faster speed      Dynamic Gait Index   Level Surface Mild Impairment    Change in Gait Speed Mild Impairment    Gait with Horizontal Head Turns Mild Impairment    Gait with Vertical Head Turns Mild Impairment    Gait and Pivot Turn Normal    Step Over Obstacle Mild Impairment    Step Around Obstacles Normal    Steps Mild Impairment    Total Score 18    DGI comment:  18/24                 Vestibular Assessment - 05/27/21 1342       Positional Sensitivities   Sit to Supine No dizziness    Supine to Left Side No dizziness    Supine to Right Side No dizziness    Supine to Sitting Lightheadedness    Nose to Right Knee No dizziness    Right Knee to Sitting No dizziness    Nose to Left Knee No dizziness    Left Knee to Sitting No dizziness    Head Turning x 5 Lightheadedness   0 in sitting; 1 in standing   Head Nodding x 5 Lightheadedness   in standing   Pivot Right in Standing No dizziness    Pivot Left in Standing No dizziness    Rolling Right No dizziness    Rolling Left No dizziness    Positional Sensitivities Comments Baseline dizziness 1/5               PT Education - 05/27/21 2026     Education Details progress towards goals and areas to continue to focus on with remaining visits    Person(s) Educated Patient    Methods Explanation    Comprehension Verbalized understanding              PT Short Term Goals - 04/22/21 1321        PT SHORT TERM GOAL #1   Title Pt will participate in more in depth vestibular re-assessment    Baseline had vestibular assesment    Time 4    Period Weeks    Status Achieved    Target Date 04/25/21      PT SHORT TERM GOAL #2   Title Pt will initiate HEP focusing on LE strength, balance and neck ROM    Baseline HEP initaited and reviewed on 04/22/21    Time 4    Period Weeks    Status Achieved    Target Date 04/25/21      PT SHORT TERM GOAL #3   Title Pt will demonstrate decreased falls risk during transitions as indicated by decrease in five time sit to stand to </= 17 seconds demonstrating ability to shift COG fully over BOS without UE support    Baseline 21 seconds with UE touching arm rests through out sit <> stand; 18.19 secs with UE support, still challenge coming to upright position    Time 4    Period Weeks    Status Not Met    Target Date 04/25/21      PT SHORT TERM GOAL #4   Title Pt will demonstrate decreased falls risk during ambulation as indicated by increase in DGI to >/= 16/24    Baseline 12/24; 14/24    Time 4    Period Weeks    Status Not Met    Target Date 04/25/21               PT Long Term Goals - 05/27/21 1357       PT LONG TERM GOAL #1   Title Pt will report improvement in DPS to >/= 58 and increase in DFS to >/= 52    Baseline DPS: 45; DFS: 48    Time 8    Period Weeks    Status Achieved    Target Date 05/25/21      PT LONG TERM GOAL #2   Title Pt will demonstrate independence with final  HEP    Time 8    Period Weeks    Status Achieved    Target Date 05/25/21      PT LONG TERM GOAL #3   Title Pt will demonstrate improvement in DGI to >/= 20/24    Baseline 18/24    Time 8    Period Weeks    Status Partially Met    Target Date 05/25/21      PT LONG TERM GOAL #4   Title Pt will report no symptoms of dizziness during sit > stand, bending forwards, performing head/body turns and tilting head back    Time 8    Period Weeks     Status Partially Met    Target Date 05/25/21      PT LONG TERM GOAL #5   Title Pt will decrease five time sit to stand to </= 17 seconds without the use of UE when performing sit > stand and stand > sit    Baseline 14 seconds    Time 8    Period Weeks    Status Achieved    Target Date 05/25/21      PT LONG TERM GOAL #6   Title Pt will demonstrate ability to perform neck extension without pain and will demonstrate 10 deg improvement in neck lateral flexion and rotation bilaterally with 50% decreased pain    Baseline see flowsheet    Time 8    Period Weeks    Status Partially Met    Target Date 05/25/21            New goals for recert:   PT Short Term Goals - 05/27/21 2035       PT SHORT TERM GOAL #1   Title = LTG             PT Long Term Goals - 05/27/21 2036       PT LONG TERM GOAL #1   Title Goals set at 4 weeks (3/11) but POC goes out 8 weeks (4/10) if needed      PT LONG TERM GOAL #2   Title Pt will demonstrate independence with final HEP    Time 4    Period Weeks    Status Revised    Target Date 06/26/21      PT LONG TERM GOAL #3   Title Pt will demonstrate improvement in DGI to >/= 20/24    Baseline 18/24    Time 4    Period Weeks    Status Revised    Target Date 06/26/21      PT LONG TERM GOAL #4   Title Pt will report no symptoms of dizziness during supine > sit, repeated horizontal and vertical head turns    Time 4    Period Weeks    Status New    Target Date 06/26/21      PT LONG TERM GOAL #6   Title Pt will demonstrate ability to perform neck extension without pain and will demonstrate 10 deg improvement in neck lateral flexion and rotation bilaterally with 50% decreased pain    Time 4    Period Weeks    Status Revised    Target Date 06/26/21               Plan - 05/27/21 2027     Clinical Impression Statement Performed assessment of patient's progress towards LTG.  Pt is making steady progress and has met 3/6 LTG.  Pt has met  FOTO goal  for both dizziness positional and dizziness functional status.  Pt is independent with current HEP and demonstrates significant improvement in 5 time sit to stand.  Pt has partially met and is progressing towards DGI and MSQ goals.  Pt is also making improvements in neck AROM but continues to report pain on R side.  Pt will benefit from continued skilled PT services to address ongoing impairments in neck pain, neck ROM, dizziness/disequilibrium, standing balance, and gait.    Personal Factors and Comorbidities Comorbidity 3+;Fitness;Past/Current Experience    Comorbidities Allergies, asthma, breast CA with lumpectomy, HTN, hypothyroidism, h/o BPPV    Examination-Activity Limitations Bend;Locomotion Level;Reach Overhead;Stand    Examination-Participation Restrictions Cleaning;Laundry;Meal Prep    Rehab Potential Good    PT Frequency 2x / week    PT Duration 8 weeks    PT Treatment/Interventions ADLs/Self Care Home Management;Aquatic Therapy;Canalith Repostioning;Cryotherapy;Moist Heat;DME Instruction;Gait training;Stair training;Functional mobility training;Therapeutic activities;Therapeutic exercise;Balance training;Neuromuscular re-education;Patient/family education;Manual techniques;Passive range of motion;Dry needling;Vestibular    PT Next Visit Plan Address R levator trigger points.  For Neck/Posture: manual therapy to help with R rotation; soft tissue work on L upper trap; posterior chain strengthening - hip extension, thoracic extension, postural exercises, deep neck flexor strengthening.  Continue to work on [employer], standing balance on compliant surfaces, vision removed.    Consulted and Agree with Plan of Care Patient             Patient will benefit from skilled therapeutic intervention in order to improve the following deficits and impairments:  Abnormal gait, Decreased balance, Decreased strength, Difficulty walking, Dizziness, Postural dysfunction, Pain, Decreased range of  motion  Visit Diagnosis: Cervicalgia  Dizziness and giddiness  Unsteadiness on feet  Difficulty in walking, not elsewhere classified  Repeated falls     Problem List Patient Active Problem List   Diagnosis Date Noted   Encounter for competency evaluation 01/09/2021   SOB (shortness of breath) 12/06/2020   Benign paroxysmal positional vertigo 05/12/2020   Orthostatic hypotension 09/23/2019   Dizziness 09/17/2019   OAB (overactive bladder) 08/27/2019   Pain of left thumb 08/27/2019   Family history of factor V Leiden mutation 08/11/2019   Peripheral edema 08/11/2019   Encounter for well adult exam with abnormal findings 08/08/2019   Hypothyroidism    Hypertension    Asthma    Allergic rhinitis    hx: right breast cancer 11/25/2011   Rico Junker, PT, DPT 05/27/21    8:38 PM    [facility] [address] [place], [place], [address] Phone: [phone]   Fax:  [phone]  Name: Julia Norton MRN: [id number] Date of Birth: 07-10-1935

## 2021-06-01 ENCOUNTER — Other Ambulatory Visit: Payer: Self-pay

## 2021-06-01 ENCOUNTER — Ambulatory Visit: Payer: Medicare Other | Admitting: Physical Therapy

## 2021-06-01 ENCOUNTER — Ambulatory Visit: Payer: Medicare Other | Admitting: Neurology

## 2021-06-01 DIAGNOSIS — M542 Cervicalgia: Secondary | ICD-10-CM

## 2021-06-01 NOTE — Therapy (Signed)
[facility] [address] [place], [place], [address] Phone: [phone]   Fax:  [phone]  Physical Therapy Treatment  Patient Details  Name: Julia Norton MRN: [id number] Date of Birth: May 01, 1935 Referring Provider (PT): Melvenia Beam, MD   Encounter Date: 06/01/2021   PT End of Session - 06/01/21 1508     Visit Number 15    Number of Visits 22    Date for PT Re-Evaluation 07/26/21    Authorization Type U[employer]    Progress Note Due on Visit 20    PT Start Time 1403    PT Stop Time 1445    PT Time Calculation (min) 42 min    Activity Tolerance Patient tolerated treatment well    Behavior During Therapy [facility] for tasks assessed/performed             Past Medical History:  Diagnosis Date   Allergic rhinitis    Asthma    Breast cancer ([place]) 1987   Erysipelas    Hemorrhoids    Hypertension    Hypothyroidism     Past Surgical History:  Procedure Laterality Date   BREAST LUMPECTOMY Right 1986   malignant   TONSILLECTOMY      There were no vitals filed for this visit.   Subjective Assessment - 06/01/21 1409     Subjective Had a good weekend.  No significant dizziness episodes.    Pertinent History Allergies, asthma, breast CA with lumpectomy, HTN, hypothyroidism, h/o BPPV    Limitations Standing;Walking    Patient Stated Goals To be back to normal; gain confidence to move around more.    Currently in Pain? No/denies               [facility] Adult PT Treatment/Exercise - 06/01/21 1459       Manual Therapy   Manual Therapy Joint mobilization;Soft tissue mobilization;Myofascial release;Scapular mobilization;Passive ROM    Manual therapy comments Performed in supine and in L sidelying    Joint Mobilization After performing tissue mobilization, scapular mobilization returned to supine and performed mobilization to continue to increase rotation to R.  Performed cervical rotation to R and then  added grade II/III upglides and downglides to mid cervical spine to increase mobility.    Soft tissue mobilization Performed STM to R levator mm in supine and sidelying focusing on manual trigger point release and elongation of muscle while performing scapular upward rotation and scapular depression with head rotated to the L.    Myofascial Release Utilized myofascial massage ball in supine with ball under R shoulder providing release of R upper trap and levator muscles while performing shoulder flexion <> extension and horizontal ABD<>ADD.    Scapular Mobilization In L sidelying PT performed scapular mobilization into depression and into upward rotation in combination with soft tissue mobilization.  Also performed scapular mobilization during R shoulder active flexion with ER <> extension with IR x 8 reps.    Passive ROM In supine performed passive ROM of bilat shoulders into retraction  and depression to stretch anterior chest/shoulder.  Cued pt to rotate head to L side and reach out through RUE while therapist provided slight over pressure to anterior shoulder to increase ROM x 5 reps.              PT Education - 06/01/21 1508     Education Details possible use of dry needling to address trigger point in R levator    Person(s) Educated Patient  Methods Explanation    Comprehension Verbalized understanding              PT Short Term Goals - 05/27/21 2035       PT SHORT TERM GOAL #1   Title = LTG               PT Long Term Goals - 05/27/21 2036       PT LONG TERM GOAL #1   Title Goals set at 4 weeks (3/11) but POC goes out 8 weeks (4/10) if needed      PT LONG TERM GOAL #2   Title Pt will demonstrate independence with final HEP    Time 4    Period Weeks    Status Revised    Target Date 06/26/21      PT LONG TERM GOAL #3   Title Pt will demonstrate improvement in DGI to >/= 20/24    Baseline 18/24    Time 4    Period Weeks    Status Revised    Target Date  06/26/21      PT LONG TERM GOAL #4   Title Pt will report no symptoms of dizziness during supine > sit, repeated horizontal and vertical head turns    Time 4    Period Weeks    Status New    Target Date 06/26/21      PT LONG TERM GOAL #6   Title Pt will demonstrate ability to perform neck extension without pain and will demonstrate 10 deg improvement in neck lateral flexion and rotation bilaterally with 50% decreased pain    Time 4    Period Weeks    Status Revised    Target Date 06/26/21                   Plan - 06/01/21 1509     Clinical Impression Statement Continued to address cervical spine ROM limitations with manual therapy and soft tissue mobilization of R levator muscle.  Pt continued to present with painful trigger point in R levator at end of session and may benefit from use of trigger point dry needling to address.    Personal Factors and Comorbidities Comorbidity 3+;Fitness;Past/Current Experience    Comorbidities Allergies, asthma, breast CA with lumpectomy, HTN, hypothyroidism, h/o BPPV    Examination-Activity Limitations Bend;Locomotion Level;Reach Overhead;Stand    Examination-Participation Restrictions Cleaning;Laundry;Meal Prep    Rehab Potential Good    PT Frequency 2x / week    PT Duration 8 weeks    PT Treatment/Interventions ADLs/Self Care Home Management;Aquatic Therapy;Canalith Repostioning;Cryotherapy;Moist Heat;DME Instruction;Gait training;Stair training;Functional mobility training;Therapeutic activities;Therapeutic exercise;Balance training;Neuromuscular re-education;Patient/family education;Manual techniques;Passive range of motion;Dry needling;Vestibular    PT Next Visit Plan Address R levator trigger points-educate on dry needling.  For Neck/Posture: manual therapy to help with R rotation; soft tissue work on L upper trap; posterior chain strengthening - hip extension, thoracic extension, postural exercises, deep neck flexor strengthening.   Continue to work on [employer], standing balance on compliant surfaces, vision removed.    Consulted and Agree with Plan of Care Patient             Patient will benefit from skilled therapeutic intervention in order to improve the following deficits and impairments:  Abnormal gait, Decreased balance, Decreased strength, Difficulty walking, Dizziness, Postural dysfunction, Pain, Decreased range of motion  Visit Diagnosis: Cervicalgia     Problem List Patient Active Problem List   Diagnosis Date Noted   Encounter for competency evaluation  01/09/2021   SOB (shortness of breath) 12/06/2020   Benign paroxysmal positional vertigo 05/12/2020   Orthostatic hypotension 09/23/2019   Dizziness 09/17/2019   OAB (overactive bladder) 08/27/2019   Pain of left thumb 08/27/2019   Family history of factor V Leiden mutation 08/11/2019   Peripheral edema 08/11/2019   Encounter for well adult exam with abnormal findings 08/08/2019   Hypothyroidism    Hypertension    Asthma    Allergic rhinitis    hx: right breast cancer 11/25/2011    Rico Junker, PT, DPT 06/01/21    3:13 PM    [facility] [address] [place], [place], [address] Phone: [phone]   Fax:  [phone]  Name: Julia Norton MRN: [id number] Date of Birth: 11-18-1935

## 2021-06-03 ENCOUNTER — Other Ambulatory Visit: Payer: Self-pay

## 2021-06-03 ENCOUNTER — Ambulatory Visit: Payer: Medicare Other | Admitting: Physical Therapy

## 2021-06-03 DIAGNOSIS — R2681 Unsteadiness on feet: Secondary | ICD-10-CM

## 2021-06-03 DIAGNOSIS — R262 Difficulty in walking, not elsewhere classified: Secondary | ICD-10-CM

## 2021-06-03 DIAGNOSIS — R296 Repeated falls: Secondary | ICD-10-CM

## 2021-06-03 DIAGNOSIS — M542 Cervicalgia: Secondary | ICD-10-CM

## 2021-06-03 DIAGNOSIS — R42 Dizziness and giddiness: Secondary | ICD-10-CM

## 2021-06-03 NOTE — Therapy (Signed)
[facility] [address] [place], [place], [address] Phone: [phone]   Fax:  [phone]  Physical Therapy Treatment  Patient Details  Name: Julia Norton MRN: [id number] Date of Birth: 1936/04/02 Referring Provider (PT): Melvenia Beam, MD   Encounter Date: 06/03/2021   PT End of Session - 06/03/21 1642     Visit Number 16    Number of Visits 22    Date for PT Re-Evaluation 07/26/21    Authorization Type UHC    Progress Note Due on Visit 20    PT Start Time 1318    PT Stop Time 1403    PT Time Calculation (min) 45 min    Activity Tolerance Patient tolerated treatment well    Behavior During Therapy [facility] for tasks assessed/performed             Past Medical History:  Diagnosis Date   Allergic rhinitis    Asthma    Breast cancer ([place]) 1987   Erysipelas    Hemorrhoids    Hypertension    Hypothyroidism     Past Surgical History:  Procedure Laterality Date   BREAST LUMPECTOMY Right 1986   malignant   TONSILLECTOMY      There were no vitals filed for this visit.   Subjective Assessment - 06/03/21 1321     Subjective Neck started to feel sore last night and more stiff today.    Pertinent History Allergies, asthma, breast CA with lumpectomy, HTN, hypothyroidism, h/o BPPV    Limitations Standing;Walking    Patient Stated Goals To be back to normal; gain confidence to move around more.    Currently in Pain? Yes               Vestibular Treatment/Exercise - 06/03/21 1331       Vestibular Treatment/Exercise   Vestibular Treatment Provided Gaze    Gaze Exercises X1 Viewing Horizontal;X1 Viewing Vertical      X1 Viewing Horizontal   Foot Position staggered stance on foam; R/L foot forwards    Reps 2    Comments 60 seconds, mild imbalance      X1 Viewing Vertical   Foot Position staggered stance on foam; R/L foot forwards    Reps 2    Comments 60 seconds, mild imbalance                Balance Exercises - 06/03/21 1351       Balance Exercises: Standing   Standing Eyes Closed Wide (BOA);Head turns;Foam/compliant surface;Other reps (comment);Limitations    Standing Eyes Closed Limitations standing across blue beam, head turns, head nods x 10 reps with min-mod A for balance and cues to attend to pressure in feet and maintaining in middle of foot.    Balance Beam Stood beside beam and performed diagonal forward/lateral stepping over beam to L and R x 2 sets with min A for balance during SLS; utilized technique of visualizing target for foot placement before stepping with improved control and balance.  Standing across beam performed: EO: stepping forwards<>back behind beam without stopping in middle x 10 reps each LE with intermittent UE support. Progressed to [facility] performing single step forwards or single step back x 10 reps each direction, each foot.  Finally progressed to [facility] with stepping forwards <> back off of beam but stopping on beam in the middle x 5 reps each LE, min A and UE support.  PT Education - 06/03/21 1642     Education Details provided pt with dry needling handout and reviewed all information with patient to allow pt time to consider and make informed decision; no change to HEP today    Person(s) Educated Patient    Methods Explanation;Handout    Comprehension Verbalized understanding              PT Short Term Goals - 05/27/21 2035       PT SHORT TERM GOAL #1   Title = LTG               PT Long Term Goals - 05/27/21 2036       PT LONG TERM GOAL #1   Title Goals set at 4 weeks (3/11) but POC goes out 8 weeks (4/10) if needed      PT LONG TERM GOAL #2   Title Pt will demonstrate independence with final HEP    Time 4    Period Weeks    Status Revised    Target Date 06/26/21      PT LONG TERM GOAL #3   Title Pt will demonstrate improvement in DGI to >/= 20/24    Baseline 18/24    Time 4    Period Weeks     Status Revised    Target Date 06/26/21      PT LONG TERM GOAL #4   Title Pt will report no symptoms of dizziness during supine > sit, repeated horizontal and vertical head turns    Time 4    Period Weeks    Status New    Target Date 06/26/21      PT LONG TERM GOAL #6   Title Pt will demonstrate ability to perform neck extension without pain and will demonstrate 10 deg improvement in neck lateral flexion and rotation bilaterally with 50% decreased pain    Time 4    Period Weeks    Status Revised    Target Date 06/26/21                   Plan - 06/03/21 1648     Clinical Impression Statement Patient slightly more off balance today but able to continue with dynamic balance training on compliant surfaces.  Increased sway and increased assistance required when vision removed.  Pt to consider dry needling information and then determine if she would like to utilize to address neck pain and ROM.    Personal Factors and Comorbidities Comorbidity 3+;Fitness;Past/Current Experience    Comorbidities Allergies, asthma, breast CA with lumpectomy, HTN, hypothyroidism, h/o BPPV    Examination-Activity Limitations Bend;Locomotion Level;Reach Overhead;Stand    Examination-Participation Restrictions Cleaning;Laundry;Meal Prep    Rehab Potential Good    PT Frequency 2x / week    PT Duration 8 weeks    PT Treatment/Interventions ADLs/Self Care Home Management;Aquatic Therapy;Canalith Repostioning;Cryotherapy;Moist Heat;DME Instruction;Gait training;Stair training;Functional mobility training;Therapeutic activities;Therapeutic exercise;Balance training;Neuromuscular re-education;Patient/family education;Manual techniques;Passive range of motion;Dry needling;Vestibular    PT Next Visit Plan Dry needling R levator??  Any cancellations later in the week for second appointment?  For Neck/Posture: manual therapy to help with R rotation; soft tissue work on L upper trap; posterior chain strengthening -  hip extension, thoracic extension, postural exercises, deep neck flexor strengthening.  Continue to work on [employer], standing balance on compliant surfaces, vision removed.    Consulted and Agree with Plan of Care Patient             Patient will  benefit from skilled therapeutic intervention in order to improve the following deficits and impairments:  Abnormal gait, Decreased balance, Decreased strength, Difficulty walking, Dizziness, Postural dysfunction, Pain, Decreased range of motion  Visit Diagnosis: Cervicalgia  Dizziness and giddiness  Unsteadiness on feet  Difficulty in walking, not elsewhere classified  Repeated falls     Problem List Patient Active Problem List   Diagnosis Date Noted   Encounter for competency evaluation 01/09/2021   SOB (shortness of breath) 12/06/2020   Benign paroxysmal positional vertigo 05/12/2020   Orthostatic hypotension 09/23/2019   Dizziness 09/17/2019   OAB (overactive bladder) 08/27/2019   Pain of left thumb 08/27/2019   Family history of factor V Leiden mutation 08/11/2019   Peripheral edema 08/11/2019   Encounter for well adult exam with abnormal findings 08/08/2019   Hypothyroidism    Hypertension    Asthma    Allergic rhinitis    hx: right breast cancer 11/25/2011    Rico Junker, PT, DPT 06/03/21    4:53 PM   [facility] [address] [place], [place], [address] Phone: [phone]   Fax:  [phone]  Name: Julia Norton MRN: [id number] Date of Birth: Oct 13, 1935

## 2021-06-03 NOTE — Patient Instructions (Signed)

## 2021-06-08 ENCOUNTER — Other Ambulatory Visit: Payer: Self-pay

## 2021-06-08 ENCOUNTER — Ambulatory Visit: Payer: Medicare Other | Admitting: Physical Therapy

## 2021-06-08 DIAGNOSIS — M542 Cervicalgia: Secondary | ICD-10-CM | POA: Diagnosis not present

## 2021-06-08 NOTE — Therapy (Signed)
[facility] [address] [place], [place], [address] Phone: [phone]   Fax:  [phone]  Physical Therapy Treatment  Patient Details  Name: Julia Norton MRN: [id number] Date of Birth: 11/15/1935 Referring Provider (PT): Melvenia Beam, MD   Encounter Date: 06/08/2021   PT End of Session - 06/08/21 1626     Visit Number 17    Number of Visits 22    Date for PT Re-Evaluation 07/26/21    Authorization Type UHC    Progress Note Due on Visit 20    PT Start Time 1535    PT Stop Time 1615    PT Time Calculation (min) 40 min    Activity Tolerance Patient tolerated treatment well    Behavior During Therapy [facility] for tasks assessed/performed             Past Medical History:  Diagnosis Date   Allergic rhinitis    Asthma    Breast cancer ([place]) 1987   Erysipelas    Hemorrhoids    Hypertension    Hypothyroidism     Past Surgical History:  Procedure Laterality Date   BREAST LUMPECTOMY Right 1986   malignant   TONSILLECTOMY      There were no vitals filed for this visit.   Subjective Assessment - 06/08/21 1542     Subjective No significant episodes of dizziness.  Had more questions about dry needling.  Neck isn't bothering her too much today so wants to wait on dry needling.    Pertinent History Allergies, asthma, breast CA with lumpectomy, HTN, hypothyroidism, h/o BPPV    Limitations Standing;Walking    Patient Stated Goals To be back to normal; gain confidence to move around more.    Currently in Pain? No/denies               [facility] Adult PT Treatment/Exercise - 06/08/21 1630       Neck Exercises: Supine   Cervical Isometrics Right rotation;Left rotation;5 reps    Neck Retraction 10 reps;5 secs;Limitations    Neck Retraction Limitations deep neck flexor training with PT providing manual slight manual traction and suboccipital release      Manual Therapy   Manual Therapy Joint  mobilization;Myofascial release;Passive ROM;Manual Traction;Muscle Energy Technique    Manual therapy comments Performed in supine with towel roll along spine and towel folded under each shoulder to provide support for rounded shoulders and to promote increased thoracic extension/elongation.    Joint Mobilization To faciliate increased cervical rotation to L and R performed R and L segmental upglides and downglides, grade II, along upper, mid and lower cervical spine x 2 sets.  No significant restrictions noted.    Myofascial Release Suboccipital release in combination with manual traction at beginning of session and following manual therapy    Passive ROM Pt performed her own passive ROM: cued pt to keep neck mm completely relaxed and use UE to passively rotate head/neck to R with manual resistance when rotating back to midline; performed 5 reps to R and then 5 reps to L.    Manual Traction combined with suboccipital release to increase upper cervical/suboccipitial ROM.  Performed isolated traction and then combined with active deep neck flexor activation.               PT Short Term Goals - 05/27/21 2035       PT SHORT TERM GOAL #1   Title = LTG  PT Long Term Goals - 05/27/21 2036       PT LONG TERM GOAL #1   Title Goals set at 4 weeks (3/11) but POC goes out 8 weeks (4/10) if needed      PT LONG TERM GOAL #2   Title Pt will demonstrate independence with final HEP    Time 4    Period Weeks    Status Revised    Target Date 06/26/21      PT LONG TERM GOAL #3   Title Pt will demonstrate improvement in DGI to >/= 20/24    Baseline 18/24    Time 4    Period Weeks    Status Revised    Target Date 06/26/21      PT LONG TERM GOAL #4   Title Pt will report no symptoms of dizziness during supine > sit, repeated horizontal and vertical head turns    Time 4    Period Weeks    Status New    Target Date 06/26/21      PT LONG TERM GOAL #6   Title Pt will  demonstrate ability to perform neck extension without pain and will demonstrate 10 deg improvement in neck lateral flexion and rotation bilaterally with 50% decreased pain    Time 4    Period Weeks    Status Revised    Target Date 06/26/21                   Plan - 06/08/21 1626     Clinical Impression Statement Pt reporting decreased pain on R side of neck today and requested to hold on dry needling for now.  Pt demonstrating increased deep neck flexor strength today and increased neck ROM in supine; also noted to have decreased restrictions in cervical spine when performing manual therapy.  Continues to report mild pain at end range R and L rotation when returning to sitting and upright against gravity.  Will continue to progress towards LTG.    Personal Factors and Comorbidities Comorbidity 3+;Fitness;Past/Current Experience    Comorbidities Allergies, asthma, breast CA with lumpectomy, HTN, hypothyroidism, h/o BPPV    Examination-Activity Limitations Bend;Locomotion Level;Reach Overhead;Stand    Examination-Participation Restrictions Cleaning;Laundry;Meal Prep    Rehab Potential Good    PT Frequency 2x / week    PT Duration 8 weeks    PT Treatment/Interventions ADLs/Self Care Home Management;Aquatic Therapy;Canalith Repostioning;Cryotherapy;Moist Heat;DME Instruction;Gait training;Stair training;Functional mobility training;Therapeutic activities;Therapeutic exercise;Balance training;Neuromuscular re-education;Patient/family education;Manual techniques;Passive range of motion;Dry needling;Vestibular    PT Next Visit Plan For Neck/Posture: manual therapy to help with R rotation; soft tissue work on L upper trap; posterior chain strengthening - hip extension, thoracic extension, postural exercises, deep neck flexor strengthening.  Continue to work on [employer], standing balance on compliant surfaces, vision removed.    Consulted and Agree with Plan of Care Patient              Patient will benefit from skilled therapeutic intervention in order to improve the following deficits and impairments:  Abnormal gait, Decreased balance, Decreased strength, Difficulty walking, Dizziness, Postural dysfunction, Pain, Decreased range of motion  Visit Diagnosis: Cervicalgia     Problem List Patient Active Problem List   Diagnosis Date Noted   Encounter for competency evaluation 01/09/2021   SOB (shortness of breath) 12/06/2020   Benign paroxysmal positional vertigo 05/12/2020   Orthostatic hypotension 09/23/2019   Dizziness 09/17/2019   OAB (overactive bladder) 08/27/2019   Pain of left thumb 08/27/2019  Family history of factor V Leiden mutation 08/11/2019   Peripheral edema 08/11/2019   Encounter for well adult exam with abnormal findings 08/08/2019   Hypothyroidism    Hypertension    Asthma    Allergic rhinitis    hx: right breast cancer 11/25/2011    Rico Junker, PT, DPT 06/08/21    4:37 PM    [facility] [address] [place], [place], [address] Phone: [phone]   Fax:  [phone]  Name: Julia Norton MRN: [id number] Date of Birth: 02/01/1936

## 2021-06-16 ENCOUNTER — Ambulatory Visit: Payer: Medicare Other | Attending: Internal Medicine

## 2021-06-16 ENCOUNTER — Other Ambulatory Visit: Payer: Self-pay

## 2021-06-16 VITALS — BP 159/83 | HR 76

## 2021-06-16 DIAGNOSIS — R262 Difficulty in walking, not elsewhere classified: Secondary | ICD-10-CM

## 2021-06-16 DIAGNOSIS — M542 Cervicalgia: Secondary | ICD-10-CM | POA: Diagnosis not present

## 2021-06-16 DIAGNOSIS — R42 Dizziness and giddiness: Secondary | ICD-10-CM

## 2021-06-16 DIAGNOSIS — R2681 Unsteadiness on feet: Secondary | ICD-10-CM

## 2021-06-16 DIAGNOSIS — R296 Repeated falls: Secondary | ICD-10-CM

## 2021-06-16 NOTE — Therapy (Signed)
[facility] ?[address] [place] ?[place], [place], [address] ?Phone: [phone]   Fax:  [phone] ? ?Physical Therapy Treatment ? ?Patient Details  ?Name: Julia Norton ?MRN: [id number] ?Date of Birth: Dec 08, 1935 ?Referring Provider (PT): Melvenia Beam, MD ? ? ?Encounter Date: 06/16/2021 ? ? PT End of Session - 06/16/21 1321   ? ? Visit Number 18   ? Number of Visits 22   ? Date for PT Re-Evaluation 07/26/21   ? Authorization Type UHC   ? Progress Note Due on Visit 20   ? PT Start Time 1320   pt arriving late  ? PT Stop Time 1400   ? PT Time Calculation (min) 40 min   ? Activity Tolerance Patient tolerated treatment well   ? Behavior During Therapy [facility] for tasks assessed/performed   ? ?  ?  ? ?  ? ? ?Past Medical History:  ?Diagnosis Date  ? Allergic rhinitis   ? Asthma   ? Breast cancer ([place]) 1987  ? Erysipelas   ? Hemorrhoids   ? Hypertension   ? Hypothyroidism   ? ? ?Past Surgical History:  ?Procedure Laterality Date  ? BREAST LUMPECTOMY Right 1986  ? malignant  ? TONSILLECTOMY    ? ? ?Vitals:  ? 06/16/21 1325  ?BP: (!) 159/83  ?Pulse: 76  ? ? ? Subjective Assessment - 06/16/21 1323   ? ? Subjective Patient reports some dizziness today, reports it gradually come on throughout the day. Feel pressure/heaviness in the head.   ? Pertinent History Allergies, asthma, breast CA with lumpectomy, HTN, hypothyroidism, h/o BPPV   ? Limitations Standing;Walking   ? Patient Stated Goals To be back to normal; gain confidence to move around more.   ? Currently in Pain? Yes   ? Pain Score 3    ? Pain Location Neck   ? Pain Orientation Right   ? Pain Descriptors / Indicators Tightness   ? Pain Type Acute pain   ? ?  ?  ? ?  ? ? ? ? Vestibular Treatment/Exercise - 06/16/21 0001   ? ?  ? Vestibular Treatment/Exercise  ? Vestibular Treatment Provided Gaze   ? Gaze Exercises X1 Viewing Horizontal;X1 Viewing Vertical   ?  ? X1 Viewing Horizontal  ? Foot Position staggered  stance on foam; R/L foot forwards   ? Reps 2   ? Comments 60 seconds, mild imbalance   ?  ? X1 Viewing Vertical  ? Foot Position staggered stance on foam; R/L foot forwards   ? Reps 2   ? Comments 60 seconds, mild imbalance   ? ?  ?  ? ?  ? ? ? ? ? Balance Exercises - 06/16/21 0001   ? ?  ? Balance Exercises: Standing  ? Stepping Strategy Anterior;Posterior;Foam/compliant surface;Limitations   ? Stepping Strategy Limitations standing across balance beam, stepping forward then back off beam returning to beam in between without UE support completed x 10 reps bilat. then progressed to completed forward steps with addition of horizontal/vertical head turns x 10 reps bilat.   ? Balance Beam standing across blue balance beam, completed bending down to bean bag (trialed to floor unable to complete due to knees), therefore placed bean bags on 6" step. Completed 2 sets x 10 toss. Intermittent balance challenge requiring CGA and touch A to countertop   ? Gait with Head Turns Forward;Limitations   ? Gait with Head Turns Limitations walking forwards with head turns to R/L  to card, PT holding lateral to patient to promote improved scanning, nore limited ROM noted on R > L. Completed 4 x 30'   ? ?  ?  ? ?  ? ? ? ? ? ? ? PT Short Term Goals - 05/27/21 2035   ? ?  ? PT SHORT TERM GOAL #1  ? Title = LTG   ? ?  ?  ? ?  ? ? ? ? PT Long Term Goals - 05/27/21 2036   ? ?  ? PT LONG TERM GOAL #1  ? Title Goals set at 4 weeks (3/11) but POC goes out 8 weeks (4/10) if needed   ?  ? PT LONG TERM GOAL #2  ? Title Pt will demonstrate independence with final HEP   ? Time 4   ? Period Weeks   ? Status Revised   ? Target Date 06/26/21   ?  ? PT LONG TERM GOAL #3  ? Title Pt will demonstrate improvement in DGI to >/= 20/24   ? Baseline 18/24   ? Time 4   ? Period Weeks   ? Status Revised   ? Target Date 06/26/21   ?  ? PT LONG TERM GOAL #4  ? Title Pt will report no symptoms of dizziness during supine > sit, repeated horizontal and vertical head  turns   ? Time 4   ? Period Weeks   ? Status New   ? Target Date 06/26/21   ?  ? PT LONG TERM GOAL #6  ? Title Pt will demonstrate ability to perform neck extension without pain and will demonstrate 10 deg improvement in neck lateral flexion and rotation bilaterally with 50% decreased pain   ? Time 4   ? Period Weeks   ? Status Revised   ? Target Date 06/26/21   ? ?  ?  ? ?  ? ? ? ? ? ? ? ? Plan - 06/16/21 1453   ? ? Clinical Impression Statement Continued VOR on complaint surfaces with staggered stance patient tolerating well. Continued standing balance with working on complaint surfaces and bending/dynamic balance activities. Increased challenge bending to ground noted. Continue to demo reduced ROM with rotation to R side. Will continue per POC.   ? Personal Factors and Comorbidities Comorbidity 3+;Fitness;Past/Current Experience   ? Comorbidities Allergies, asthma, breast CA with lumpectomy, HTN, hypothyroidism, h/o BPPV   ? Examination-Activity Limitations Bend;Locomotion Level;Reach Overhead;Stand   ? Examination-Participation Restrictions Cleaning;Laundry;Meal Prep   ? Rehab Potential Good   ? PT Frequency 2x / week   ? PT Duration 8 weeks   ? PT Treatment/Interventions ADLs/Self Care Home Management;Aquatic Therapy;Canalith Repostioning;Cryotherapy;Moist Heat;DME Instruction;Gait training;Stair training;Functional mobility training;Therapeutic activities;Therapeutic exercise;Balance training;Neuromuscular re-education;Patient/family education;Manual techniques;Passive range of motion;Dry needling;Vestibular   ? PT Next Visit Plan For Neck/Posture: manual therapy to help with R rotation; soft tissue work on L upper trap; posterior chain strengthening - hip extension, thoracic extension, postural exercises, deep neck flexor strengthening.  Continue to work on [employer], standing balance on compliant surfaces, vision removed.   ? Consulted and Agree with Plan of Care Patient   ? ?  ?  ? ?  ? ? ?Patient will  benefit from skilled therapeutic intervention in order to improve the following deficits and impairments:  Abnormal gait, Decreased balance, Decreased strength, Difficulty walking, Dizziness, Postural dysfunction, Pain, Decreased range of motion ? ?Visit Diagnosis: ?Cervicalgia ? ?Dizziness and giddiness ? ?Unsteadiness on feet ? ?Difficulty in walking, not elsewhere  classified ? ?Repeated falls ? ? ? ? ?Problem List ?Patient Active Problem List  ? Diagnosis Date Noted  ? Encounter for competency evaluation 01/09/2021  ? SOB (shortness of breath) 12/06/2020  ? Benign paroxysmal positional vertigo 05/12/2020  ? Orthostatic hypotension 09/23/2019  ? Dizziness 09/17/2019  ? OAB (overactive bladder) 08/27/2019  ? Pain of left thumb 08/27/2019  ? Family history of factor V Leiden mutation 08/11/2019  ? Peripheral edema 08/11/2019  ? Encounter for well adult exam with abnormal findings 08/08/2019  ? Hypothyroidism   ? Hypertension   ? Asthma   ? Allergic rhinitis   ? hx: right breast cancer 11/25/2011  ? ? ?Jones Bales, PT, DPT ?06/16/2021, 2:55 PM ? ?[place] ?[address] ?[place], [place], [address] ?Phone: [phone]   Fax:  [phone] ? ?Name: MASHAL SLAVICK ?MRN: [id number] ?Date of Birth: Nov 07, 1935 ? ? ? ?

## 2021-06-17 ENCOUNTER — Ambulatory Visit: Payer: Medicare Other

## 2021-06-17 DIAGNOSIS — R42 Dizziness and giddiness: Secondary | ICD-10-CM

## 2021-06-17 DIAGNOSIS — M542 Cervicalgia: Secondary | ICD-10-CM

## 2021-06-17 NOTE — Therapy (Signed)
[place] ?[place] ?[place], [place], [address] ?Phone: [phone]   Fax:  [phone] ? ?Physical Therapy Treatment ? ?Patient Details  ?Name: Julia Norton ?MRN: [id number] ?Date of Birth: 09-20-1935 ?Referring Provider (PT): Melvenia Beam, MD ? ? ?Encounter Date: 06/17/2021 ? ? PT End of Session - 06/17/21 1532   ? ? Visit Number 19   ? Number of Visits 22   ? Date for PT Re-Evaluation 07/26/21   ? Authorization Type UHC   ? Progress Note Due on Visit 20   ? PT Start Time 1532   ? PT Stop Time 1615   ? PT Time Calculation (min) 43 min   ? Activity Tolerance Patient tolerated treatment well   ? Behavior During Therapy [facility] for tasks assessed/performed   ? ?  ?  ? ?  ? ? ?Past Medical History:  ?Diagnosis Date  ? Allergic rhinitis   ? Asthma   ? Breast cancer ([place]) 1987  ? Erysipelas   ? Hemorrhoids   ? Hypertension   ? Hypothyroidism   ? ? ?Past Surgical History:  ?Procedure Laterality Date  ? BREAST LUMPECTOMY Right 1986  ? malignant  ? TONSILLECTOMY    ? ? ?There were no vitals filed for this visit. ? ? Subjective Assessment - 06/17/21 1534   ? ? Subjective Patient reports some dizziness, as well as the pressure in the head.  Patient also experiencing some neck pain as well.   ? Pertinent History Allergies, asthma, breast CA with lumpectomy, HTN, hypothyroidism, h/o BPPV   ? Limitations Standing;Walking   ? Patient Stated Goals To be back to normal; gain confidence to move around more.   ? Currently in Pain? Yes   ? Pain Score 3    ? Pain Location Neck   ? Pain Orientation Right   ? Pain Descriptors / Indicators Tightness   ? Pain Type Acute pain   ? Pain Onset More than a month ago   ? Pain Frequency Intermittent   ? ?  ?  ? ?  ? ? ? [place] Adult PT Treatment/Exercise - 06/17/21 0001   ? ?  ? Exercises  ? Exercises Neck   ?  ? Neck Exercises: Standing  ? Other Standing Exercises completed standing isometric small range cervical flexion into  ball, 10 x 3 second hold.   ?  ? Neck Exercises: Supine  ? Cervical Isometrics Right rotation;Left rotation;10 reps;3 secs   ? Cervical Isometrics Limitations completed AROM to each side and performing isometric activation at that range x 10 reps with 3 seconds. PT providing slgiht manual resistance to patient's tolerance.   ? Capital Flexion 10 reps;Limitations   ? Capital Flexion Limitations with PT manually blocking movement below C2, completed x 10 reps   ?  ? Neck Exercises: Stretches  ? Upper Trapezius Stretch Right;2 reps;30 seconds   ? Upper Trapezius Stretch Limitations supine; completed with PT providing passive stretch to area   ? Levator Stretch Right;Limitations;3 reps   ? Levator Stretch Limitations supine; completed with PT providing passive stretch to area with stabilization of scapula and shoulder depression, completed 3x 30 seconds.   ?  ? Shoulder Exercises: Sidelying  ? Other Sidelying Exercises completed sidelying open book stretch on bilateral sides x 10 reps with 5-10 seconds hold to promote thoracic spine mobility and stretch to pec region.   ?  ? Manual Therapy  ? Soft tissue mobilization Performed STM to  R levator mm and R Upper Trap in supine focusing on manual trigger point release and elongation of muscle with head rotated to the L.   ? ?  ?  ? ?  ? ? ? ? ? ? PT Short Term Goals - 05/27/21 2035   ? ?  ? PT SHORT TERM GOAL #1  ? Title = LTG   ? ?  ?  ? ?  ? ? ? ? PT Long Term Goals - 05/27/21 2036   ? ?  ? PT LONG TERM GOAL #1  ? Title Goals set at 4 weeks (3/11) but POC goes out 8 weeks (4/10) if needed   ?  ? PT LONG TERM GOAL #2  ? Title Pt will demonstrate independence with final HEP   ? Time 4   ? Period Weeks   ? Status Revised   ? Target Date 06/26/21   ?  ? PT LONG TERM GOAL #3  ? Title Pt will demonstrate improvement in DGI to >/= 20/24   ? Baseline 18/24   ? Time 4   ? Period Weeks   ? Status Revised   ? Target Date 06/26/21   ?  ? PT LONG TERM GOAL #4  ? Title Pt will report no  symptoms of dizziness during supine > sit, repeated horizontal and vertical head turns   ? Time 4   ? Period Weeks   ? Status New   ? Target Date 06/26/21   ?  ? PT LONG TERM GOAL #6  ? Title Pt will demonstrate ability to perform neck extension without pain and will demonstrate 10 deg improvement in neck lateral flexion and rotation bilaterally with 50% decreased pain   ? Time 4   ? Period Weeks   ? Status Revised   ? Target Date 06/26/21   ? ?  ?  ? ?  ? ? ? ? ? ? ? ? Plan - 06/17/21 1906   ? ? Clinical Impression Statement Continued to address cervical spine ROM with soft tissue mobilization of R levator muscle, as well as continued isometrics for improved deep knee flexor strength. Pt reporting minimal pain today with activities but still noted mainly at end range with rotation to R. Will continue per POC.   ? Personal Factors and Comorbidities Comorbidity 3+;Fitness;Past/Current Experience   ? Comorbidities Allergies, asthma, breast CA with lumpectomy, HTN, hypothyroidism, h/o BPPV   ? Examination-Activity Limitations Bend;Locomotion Level;Reach Overhead;Stand   ? Examination-Participation Restrictions Cleaning;Laundry;Meal Prep   ? Rehab Potential Good   ? PT Frequency 2x / week   ? PT Duration 8 weeks   ? PT Treatment/Interventions ADLs/Self Care Home Management;Aquatic Therapy;Canalith Repostioning;Cryotherapy;Moist Heat;DME Instruction;Gait training;Stair training;Functional mobility training;Therapeutic activities;Therapeutic exercise;Balance training;Neuromuscular re-education;Patient/family education;Manual techniques;Passive range of motion;Dry needling;Vestibular   ? PT Next Visit Plan For Neck/Posture: manual therapy to help with R rotation; soft tissue work on L upper trap; posterior chain strengthening - hip extension, thoracic extension, postural exercises, deep neck flexor strengthening.  Continue to work on [employer], standing balance on compliant surfaces, vision removed.   ? Consulted and  Agree with Plan of Care Patient   ? ?  ?  ? ?  ? ? ?Patient will benefit from skilled therapeutic intervention in order to improve the following deficits and impairments:  Abnormal gait, Decreased balance, Decreased strength, Difficulty walking, Dizziness, Postural dysfunction, Pain, Decreased range of motion ? ?Visit Diagnosis: ?Cervicalgia ? ?Dizziness and giddiness ? ? ? ? ?Problem  List ?Patient Active Problem List  ? Diagnosis Date Noted  ? Encounter for competency evaluation 01/09/2021  ? SOB (shortness of breath) 12/06/2020  ? Benign paroxysmal positional vertigo 05/12/2020  ? Orthostatic hypotension 09/23/2019  ? Dizziness 09/17/2019  ? OAB (overactive bladder) 08/27/2019  ? Pain of left thumb 08/27/2019  ? Family history of factor V Leiden mutation 08/11/2019  ? Peripheral edema 08/11/2019  ? Encounter for well adult exam with abnormal findings 08/08/2019  ? Hypothyroidism   ? Hypertension   ? Asthma   ? Allergic rhinitis   ? hx: right breast cancer 11/25/2011  ? ? ?Jones Bales, PT, DPT ?06/17/2021, 7:08 PM ? ?[facility] ?[address] ?[place], [place], [address] ?Phone: [phone]   Fax:  [phone] ? ?Name: Julia Norton ?MRN: [id number] ?Date of Birth: 10-06-1935 ? ? ? ?

## 2021-06-21 ENCOUNTER — Ambulatory Visit: Payer: Medicare Other

## 2021-06-23 ENCOUNTER — Other Ambulatory Visit: Payer: Self-pay

## 2021-06-23 ENCOUNTER — Ambulatory Visit: Payer: Medicare Other

## 2021-06-23 DIAGNOSIS — M542 Cervicalgia: Secondary | ICD-10-CM | POA: Diagnosis not present

## 2021-06-23 DIAGNOSIS — R2681 Unsteadiness on feet: Secondary | ICD-10-CM

## 2021-06-23 DIAGNOSIS — R42 Dizziness and giddiness: Secondary | ICD-10-CM

## 2021-06-23 NOTE — Therapy (Signed)
[place] ?[place] ?[place], [place], [address] ?Phone: [phone]   Fax:  [phone] ? ?Physical Therapy Treatment/Progress Note ? ?Patient Details  ?Name: Julia Norton ?MRN: [id number] ?Date of Birth: [birth year] ?Referring Provider (PT): Melvenia Beam, MD ? ?Physical Therapy Progress Note ?  ?Dates of Reporting Period:05/13/21 - 06/23/21 ? ?See Note below for Objective Data and Assessment of Progress/Goals. ? ?Thank you for the referral of this patient. ?Guillermina [place], PT, DPT ? ? ?Encounter Date: 06/23/2021 ? ? PT End of Session - 06/23/21 1233   ? ? Visit Number 20   ? Number of Visits 22   ? Date for PT Re-Evaluation 07/26/21   ? Authorization Type UHC   ? Progress Note Due on Visit 20   ? PT Start Time 1232   ? PT Stop Time 1315   ? PT Time Calculation (min) 43 min   ? Activity Tolerance Patient tolerated treatment well   ? Behavior During Therapy [facility] for tasks assessed/performed   ? ?  ?  ? ?  ? ? ?Past Medical History:  ?Diagnosis Date  ? Allergic rhinitis   ? Asthma   ? Breast cancer ([place]) 1987  ? Erysipelas   ? Hemorrhoids   ? Hypertension   ? Hypothyroidism   ? ? ?Past Surgical History:  ?Procedure Laterality Date  ? BREAST LUMPECTOMY Right 1986  ? malignant  ? TONSILLECTOMY    ? ? ?There were no vitals filed for this visit. ? ? Subjective Assessment - 06/23/21 1233   ? ? Subjective Patient reports feeling okay today. Reports yesterday was bad day regarding the dizziness/head. No other new changes.   ? Pertinent History Allergies, asthma, breast CA with lumpectomy, HTN, hypothyroidism, h/o BPPV   ? Limitations Standing;Walking   ? Patient Stated Goals To be back to normal; gain confidence to move around more.   ? Currently in Pain? Yes   ? Pain Score 2    ? Pain Location Neck   ? Pain Orientation Right   ? Pain Descriptors / Indicators Tightness   ? Pain Type Acute pain   ? Pain Onset More than a month ago   ? Pain Frequency  Intermittent   ? Aggravating Factors  head turns   ? ?  ?  ? ?  ? ? ? ? [place] Adult PT Treatment/Exercise - 06/23/21 0001   ? ?  ? Exercises  ? Exercises Shoulder;Neck;Other Exercises   ? Other Exercises  Per patient request for strengthening activities, transitioned to stregthening of BLE. PT educating on slight mini squat to chair with light touch x 10 reps. Then lateral resisted step out to target with use of yellow theraband x 10 reps bilat.   ?  ? Neck Exercises: Supine  ? Neck Retraction 10 reps;5 secs;Limitations   ? Neck Retraction Limitations completed with patient supine, 2 x 10 reps with 5 second hold. Cues for technique.   ?  ? Shoulder Exercises: Seated  ? Horizontal ABduction Strengthening;Both;10 reps;Theraband   ? Theraband Level (Shoulder Horizontal ABduction) Level 1 (Yellow)   ? Horizontal ABduction Limitations seated in chair with half bolster behind back for improved upright posture and scapular engagement, PT faciliating as needed. Completed 2 x 10 reps.   ? Flexion AROM;Strengthening;Both;10 reps;Theraband;Limitations   ? Theraband Level (Shoulder Flexion) Level 1 (Yellow)   ? Flexion Limitations seated alteranting BUE flexion with yellow band, cues for technique. PT ensure no early engagement  of upper trap. Completed 2 x 10 reps.   ? Other Seated Exercises Completed scapular retraction seated with yellow theraband, 2 x 10 reps. Cues for technique and improved scpaualr mobility.   ? ?  ?  ? ?  ? ? ? ? ? PT Short Term Goals - 05/27/21 2035   ? ?  ? PT SHORT TERM GOAL #1  ? Title = LTG   ? ?  ?  ? ?  ? ? ? ? PT Long Term Goals - 05/27/21 2036   ? ?  ? PT LONG TERM GOAL #1  ? Title Goals set at 4 weeks (3/11) but POC goes out 8 weeks (4/10) if needed   ?  ? PT LONG TERM GOAL #2  ? Title Pt will demonstrate independence with final HEP   ? Time 4   ? Period Weeks   ? Status Revised   ? Target Date 06/26/21   ?  ? PT LONG TERM GOAL #3  ? Title Pt will demonstrate improvement in DGI to >/= 20/24   ?  Baseline 18/24   ? Time 4   ? Period Weeks   ? Status Revised   ? Target Date 06/26/21   ?  ? PT LONG TERM GOAL #4  ? Title Pt will report no symptoms of dizziness during supine > sit, repeated horizontal and vertical head turns   ? Time 4   ? Period Weeks   ? Status New   ? Target Date 06/26/21   ?  ? PT LONG TERM GOAL #6  ? Title Pt will demonstrate ability to perform neck extension without pain and will demonstrate 10 deg improvement in neck lateral flexion and rotation bilaterally with 50% decreased pain   ? Time 4   ? Period Weeks   ? Status Revised   ? Target Date 06/26/21   ? ?  ?  ? ?  ? ? ? ? ? ? ? ? Plan - 06/23/21 1322   ? ? Clinical Impression Statement Continued neck/shoulder strengthening activities today wiht patient tolerating well, continue to require cues for proper activation pattern to promote iproved movement pattern. Intermittent rest breaks required. Patient is making steady progress with PT services and will continue to benefit from skilled PT services.   ? Personal Factors and Comorbidities Comorbidity 3+;Fitness;Past/Current Experience   ? Comorbidities Allergies, asthma, breast CA with lumpectomy, HTN, hypothyroidism, h/o BPPV   ? Examination-Activity Limitations Bend;Locomotion Level;Reach Overhead;Stand   ? Examination-Participation Restrictions Cleaning;Laundry;Meal Prep   ? Rehab Potential Good   ? PT Frequency 2x / week   ? PT Duration 8 weeks   ? PT Treatment/Interventions ADLs/Self Care Home Management;Aquatic Therapy;Canalith Repostioning;Cryotherapy;Moist Heat;DME Instruction;Gait training;Stair training;Functional mobility training;Therapeutic activities;Therapeutic exercise;Balance training;Neuromuscular re-education;Patient/family education;Manual techniques;Passive range of motion;Dry needling;Vestibular   ? PT Next Visit Plan For Neck/Posture: manual therapy to help with R rotation; soft tissue work on L upper trap; posterior chain strengthening - hip extension, thoracic  extension, postural exercises, deep neck flexor strengthening.  Continue to work on [employer], standing balance on compliant surfaces, vision removed.   ? Consulted and Agree with Plan of Care Patient   ? ?  ?  ? ?  ? ? ?Patient will benefit from skilled therapeutic intervention in order to improve the following deficits and impairments:  Abnormal gait, Decreased balance, Decreased strength, Difficulty walking, Dizziness, Postural dysfunction, Pain, Decreased range of motion ? ?Visit Diagnosis: ?Cervicalgia ? ?Dizziness and giddiness ? ?Unsteadiness on  feet ? ? ? ? ?Problem List ?Patient Active Problem List  ? Diagnosis Date Noted  ? Encounter for competency evaluation 01/09/2021  ? SOB (shortness of breath) 12/06/2020  ? Benign paroxysmal positional vertigo 05/12/2020  ? Orthostatic hypotension 09/23/2019  ? Dizziness 09/17/2019  ? OAB (overactive bladder) 08/27/2019  ? Pain of left thumb 08/27/2019  ? Family history of factor V Leiden mutation 08/11/2019  ? Peripheral edema 08/11/2019  ? Encounter for well adult exam with abnormal findings 08/08/2019  ? Hypothyroidism   ? Hypertension   ? Asthma   ? Allergic rhinitis   ? hx: right breast cancer 11/25/2011  ? ? ?Jones Bales, PT, DPT ?06/23/2021, 1:23 PM ? ?[place] ?[place] ?[place], [place], [address] ?Phone: [phone]   Fax:  [phone] ? ?Name: Julia Norton ?MRN: [id number] ?Date of Birth: 04-Mar-1936 ? ? ? ?

## 2021-06-29 ENCOUNTER — Ambulatory Visit: Payer: Medicare Other

## 2021-06-29 ENCOUNTER — Other Ambulatory Visit: Payer: Self-pay

## 2021-06-29 DIAGNOSIS — M542 Cervicalgia: Secondary | ICD-10-CM

## 2021-06-29 DIAGNOSIS — R2681 Unsteadiness on feet: Secondary | ICD-10-CM

## 2021-06-29 DIAGNOSIS — R42 Dizziness and giddiness: Secondary | ICD-10-CM

## 2021-06-29 DIAGNOSIS — R262 Difficulty in walking, not elsewhere classified: Secondary | ICD-10-CM

## 2021-06-29 DIAGNOSIS — R296 Repeated falls: Secondary | ICD-10-CM

## 2021-06-29 NOTE — Therapy (Signed)
?OUTPATIENT PHYSICAL THERAPY TREATMENT NOTE ? ? ?Patient Name: Julia Norton ?MRN: [id number] ?DOB:[birth year], 86 y.o., female ?Today's Date: 06/29/2021 ? ?PCP: Biagio Borg, MD ?REFERRING PROVIDER: Melvenia Beam, MD ? ? PT End of Session - 06/29/21 1232   ? ? Visit Number 21   ? Number of Visits 22   ? Date for PT Re-Evaluation 07/26/21   ? Authorization Type UHC   ? Progress Note Due on Visit 20   ? PT Start Time 1232   ? PT Stop Time 1315   ? PT Time Calculation (min) 43 min   ? Activity Tolerance Patient tolerated treatment well   ? Behavior During Therapy [facility] for tasks assessed/performed   ? ?  ?  ? ?  ? ? ?Past Medical History:  ?Diagnosis Date  ? Allergic rhinitis   ? Asthma   ? Breast cancer ([place]) 1987  ? Erysipelas   ? Hemorrhoids   ? Hypertension   ? Hypothyroidism   ? ?Past Surgical History:  ?Procedure Laterality Date  ? BREAST LUMPECTOMY Right 1986  ? malignant  ? TONSILLECTOMY    ? ?Patient Active Problem List  ? Diagnosis Date Noted  ? Encounter for competency evaluation 01/09/2021  ? SOB (shortness of breath) 12/06/2020  ? Benign paroxysmal positional vertigo 05/12/2020  ? Orthostatic hypotension 09/23/2019  ? Dizziness 09/17/2019  ? OAB (overactive bladder) 08/27/2019  ? Pain of left thumb 08/27/2019  ? Family history of factor V Leiden mutation 08/11/2019  ? Peripheral edema 08/11/2019  ? Encounter for well adult exam with abnormal findings 08/08/2019  ? Hypothyroidism   ? Hypertension   ? Asthma   ? Allergic rhinitis   ? hx: right breast cancer 11/25/2011  ? ? ?REFERRING DIAG: H81.10 (ICD-10-CM) - Benign paroxysmal positional vertigo, unspecified laterality ? ?THERAPY DIAG:  ?Cervicalgia ? ?Dizziness and giddiness ? ?Unsteadiness on feet ? ?Difficulty in walking, not elsewhere classified ? ?Repeated falls ? ?PERTINENT HISTORY: Allergies, asthma, breast CA with lumpectomy, HTN, hypothyroidism, h/o BPPV  ? ?PRECAUTIONS: Allergies, asthma, breast CA with lumpectomy, HTN, hypothyroidism, h/o  BPPV  ? ?SUBJECTIVE: Patient reports doing well today. Reports head is feeling good today. Still having some discomfort in the neck, reports it feels better now than when she woke up.  ? ?PAIN:  ?Are you having pain?  3/10, neck, only occurs with neck movement ? ?TODAY'S TREATMENT:  ?Therex:  ?Completed clamshell on bilateral sides, completed x 10 reps each with yellow band, then progressed to red band x 10 reps. Pt tolerating increase in resistance well. Added to HEP.   ? ?Bolded Added to HEP during today's session:  ?Access Code: [email or web address] ?URL: [email or web address] ?Date: 06/29/2021 ?Prepared by: Baldomero Lamy ? ?Exercises ?Heel Toe Raises with Unilateral Counter Support - 1 x daily - 7 x weekly - 2 sets - 10 reps ?Sit to Stand with Immediate Step - 1 x daily - 7 x weekly - 3-5 sets ?Romberg Stance on Foam Pad with Head Rotation - 1 x daily - 7 x weekly - 1 sets - 10 reps ?Feet Together Balance with Head Nods on Foam Pad - 1 x daily - 7 x weekly - 1 sets - 10 reps ?Supine Deep Neck Flexor Nods - 1 x daily - 7 x weekly - 2 sets - 10 reps - 6 seconds hold ?Clamshell with Resistance - 1 x daily - 7 x weekly - 2 sets - 10 reps - provided red theraband ? ? ?NMR: ?Standing on  blue mat on incline, completed staggered stance with EO and horizontal/vertical head turns x 10 reps each. Then completed second set with alternating foot position. CGA required due to imbalance. Then with feet hip width, completed eyes closed 3 x 30 seconds each.  ? ? Vestibular Treatment/Exercise - 06/29/21 0001   ? ?  ? Vestibular Treatment/Exercise  ? Vestibular Treatment Provided Gaze   ? Gaze Exercises X1 Viewing Horizontal;X1 Viewing Vertical   ?  ? X1 Viewing Horizontal  ? Foot Position staggered stance on foam; R/L foot forwards   ? Reps 2   ? Comments 60 seconds, mild imbalance   ?  ? X1 Viewing Vertical  ? Foot Position staggered stance on foam; R/L foot forwards   ? Reps 2   ? Comments 60 seconds, mild imbalance   ? ?  ?   ? ?  ? ? ? ?PATIENT EDUCATION: ?Education details: Continue HEP; Plan to assess goals at next session ?Person educated: Patient ?Education method: Explanation ?Education comprehension: verbalized understanding ? ? ?HOME EXERCISE PROGRAM: ?Access Code: [email or web address] ? ? PT Short Term Goals - 05/27/21 2035   ? ?  ? PT SHORT TERM GOAL #1  ? Title = LTG   ? ?  ?  ? ?  ? ? ? PT Long Term Goals - 05/27/21 2036   ? ?  ? PT LONG TERM GOAL #1  ? Title Goals set at 4 weeks (3/11) but POC goes out 8 weeks (4/10) if needed   ?  ? PT LONG TERM GOAL #2  ? Title Pt will demonstrate independence with final HEP   ? Time 4   ? Period Weeks   ? Status Revised   ? Target Date 06/26/21   ?  ? PT LONG TERM GOAL #3  ? Title Pt will demonstrate improvement in DGI to >/= 20/24   ? Baseline 18/24   ? Time 4   ? Period Weeks   ? Status Revised   ? Target Date 06/26/21   ?  ? PT LONG TERM GOAL #4  ? Title Pt will report no symptoms of dizziness during supine > sit, repeated horizontal and vertical head turns   ? Time 4   ? Period Weeks   ? Status New   ? Target Date 06/26/21   ?  ? PT LONG TERM GOAL #6  ? Title Pt will demonstrate ability to perform neck extension without pain and will demonstrate 10 deg improvement in neck lateral flexion and rotation bilaterally with 50% decreased pain   ? Time 4   ? Period Weeks   ? Status Revised   ? Target Date 06/26/21   ? ?  ?  ? ? ?Plan - 06/29/21 1232   ? ? Clinical Impression Statement Continued balance activities and VOR progression, patient tolerating well. Continued to update strengthening activities to promote improved proximal hip stability and balance. Will continue per POC.   ? Personal Factors and Comorbidities Comorbidity 3+;Fitness;Past/Current Experience   ? Comorbidities Allergies, asthma, breast CA with lumpectomy, HTN, hypothyroidism, h/o BPPV   ? Examination-Activity Limitations Bend;Locomotion Level;Reach Overhead;Stand   ? Examination-Participation Restrictions Cleaning;Laundry;Meal Prep    ? Rehab Potential Good   ? PT Frequency 2x / week   ? PT Duration 8 weeks   ? PT Treatment/Interventions ADLs/Self Care Home Management;Aquatic Therapy;Canalith Repostioning;Cryotherapy;Moist Heat;DME Instruction;Gait training;Stair training;Functional mobility training;Therapeutic activities;Therapeutic exercise;Balance training;Neuromuscular re-education;Patient/family education;Manual techniques;Passive range of motion;Dry needling;Vestibular   ? PT Next Visit Plan  LTG goal check, patient mentioned going on hold vs. d/c? For Neck/Posture: manual therapy to help with R rotation; soft tissue work on L upper trap; posterior chain strengthening - hip extension, thoracic extension, postural exercises, deep neck flexor strengthening.  Continue to work on [employer], standing balance on compliant surfaces, vision removed.   ? Consulted and Agree with Plan of Care Patient   ? ? ? ?Jones Bales, PT, DPT ?06/29/2021, 8:39 PM ? ?  ? ?

## 2021-07-01 ENCOUNTER — Ambulatory Visit: Payer: Medicare Other | Admitting: Physical Therapy

## 2021-07-01 ENCOUNTER — Other Ambulatory Visit: Payer: Self-pay

## 2021-07-01 DIAGNOSIS — R262 Difficulty in walking, not elsewhere classified: Secondary | ICD-10-CM

## 2021-07-01 DIAGNOSIS — R296 Repeated falls: Secondary | ICD-10-CM

## 2021-07-01 DIAGNOSIS — M542 Cervicalgia: Secondary | ICD-10-CM | POA: Diagnosis not present

## 2021-07-01 DIAGNOSIS — R42 Dizziness and giddiness: Secondary | ICD-10-CM

## 2021-07-01 DIAGNOSIS — R2681 Unsteadiness on feet: Secondary | ICD-10-CM

## 2021-07-01 NOTE — Patient Instructions (Addendum)
Gaze Stabilization: Standing Feet Together (Compliant Surface) ? ? ? ?Feet together on pillow, keeping eyes on target on wall __3__ feet away, tilt head down 15-30? and move head side to side for __60__ seconds. Repeat while moving head up and down for __60__ seconds. ?Do __2__ sessions per day. ? ?Access Code: [email or web address] ?URL: [email or web address] ?Date: 07/01/2021 ?Prepared by: Misty Stanley ? ?Exercises ?Heel Toe Raises with Unilateral Counter Support - 1 x daily - 7 x weekly - 2 sets - 10 reps ?Sit to Stand with Immediate Step - 1 x daily - 7 x weekly - 3-5 sets ?Romberg Stance on Foam Pad with Head Rotation - 1 x daily - 7 x weekly - 1 sets - 10 reps ?Feet Together Balance with Head Nods on Foam Pad - 1 x daily - 7 x weekly - 1 sets - 10 reps ?Supine Deep Neck Flexor Nods - 1 x daily - 7 x weekly - 2 sets - 10 reps - 6 seconds hold ?Clamshell with Resistance - 1 x daily - 7 x weekly - 2 sets - 10 reps ?Side Stepping with Resistance at Ankles and Counter Support - 1 x daily - 7 x weekly - 1 sets - 10 reps ?Seated Shoulder Horizontal Abduction with Resistance - 1 x daily - 7 x weekly - 2 sets - 10 reps ?Seated Shoulder Row with Anchored Resistance - 1 x daily - 7 x weekly - 2 sets - 10 reps ?Seated Shoulder Flexion with Resistance - 1 x daily - 7 x weekly - 2 sets - 10 reps ? ? ? ?

## 2021-07-01 NOTE — Therapy (Signed)
?OUTPATIENT PHYSICAL THERAPY TREATMENT NOTE and Discharge Summary ? ? ?Patient Name: Julia Norton ?MRN: [id number] ?DOB:1935/04/20, 86 y.o., female ?Today's Date: 07/01/2021 ? ?PCP: Biagio Borg, MD ?REFERRING PROVIDER: Melvenia Beam, MD ? ? PT End of Session - 07/01/21 1535   ? ? Visit Number 22   ? Number of Visits 22   ? Date for PT Re-Evaluation 07/26/21   ? Authorization Type UHC   ? Progress Note Due on Visit 30   ? PT Start Time 1533   ? PT Stop Time 1615   ? PT Time Calculation (min) 42 min   ? Activity Tolerance Patient tolerated treatment well   ? Behavior During Therapy [facility] for tasks assessed/performed   ? ?  ?  ? ?  ? ? ? ?Past Medical History:  ?Diagnosis Date  ? Allergic rhinitis   ? Asthma   ? Breast cancer ([place]) 1987  ? Erysipelas   ? Hemorrhoids   ? Hypertension   ? Hypothyroidism   ? ?Past Surgical History:  ?Procedure Laterality Date  ? BREAST LUMPECTOMY Right 1986  ? malignant  ? TONSILLECTOMY    ? ?Patient Active Problem List  ? Diagnosis Date Noted  ? Encounter for competency evaluation 01/09/2021  ? SOB (shortness of breath) 12/06/2020  ? Benign paroxysmal positional vertigo 05/12/2020  ? Orthostatic hypotension 09/23/2019  ? Dizziness 09/17/2019  ? OAB (overactive bladder) 08/27/2019  ? Pain of left thumb 08/27/2019  ? Family history of factor V Leiden mutation 08/11/2019  ? Peripheral edema 08/11/2019  ? Encounter for well adult exam with abnormal findings 08/08/2019  ? Hypothyroidism   ? Hypertension   ? Asthma   ? Allergic rhinitis   ? hx: right breast cancer 11/25/2011  ? ? ?REFERRING DIAG: H81.10 (ICD-10-CM) - Benign paroxysmal positional vertigo, unspecified laterality ? ?THERAPY DIAG:  ?Cervicalgia ? ?Dizziness and giddiness ? ?Unsteadiness on feet ? ?Difficulty in walking, not elsewhere classified ? ?Repeated falls ? ?PERTINENT HISTORY: Allergies, asthma, breast CA with lumpectomy, HTN, hypothyroidism, h/o BPPV  ? ?PRECAUTIONS: Allergies, asthma, breast CA with lumpectomy,  HTN, hypothyroidism, h/o BPPV  ? ?SUBJECTIVE:  Had about 3 days of no dizziness, dizziness came back today.  Neck feels better.  Has been running a lot of errands today. ? ?PAIN:  ?Are you having pain?  3/10, neck, only occurs with neck movement ? ?TODAY'S TREATMENT:  ?07/01/2021: ? [facility] PT Assessment - 07/01/21 1538   ? ?  ? Assessment  ? Medical Diagnosis BPPV   ? Referring Provider (PT) Melvenia Beam, MD   ? Onset Date/Surgical Date 03/23/21   ?  ? Precautions  ? Precautions Other (comment)   ? Precaution Comments Allergies, asthma, breast CA with lumpectomy, HTN, hypothyroidism, h/o BPPV   ?  ? Prior Function  ? Level of Independence Independent   ?  ? AROM  ? Overall AROM  Deficits   ? AROM Assessment Site Cervical   ? Cervical Flexion 60   ? Cervical Extension 40   ? Cervical - Right Side Bend 35   ? Cervical - Left Side Bend 30   ? Cervical - Right Rotation 50   ? Cervical - Left Rotation 50   ?  ? Dynamic Gait Index  ? Level Surface Mild Impairment   ? Change in Gait Speed Normal   ? Gait with Horizontal Head Turns Mild Impairment   ? Gait with Vertical Head Turns Normal   ? Gait and Pivot  Turn Normal   ? Step Over Obstacle Mild Impairment   ? Step Around Obstacles Normal   ? Steps Mild Impairment   ? Total Score 20   ? DGI comment: 20/24   ? ?  ?  ? ?  ? ? Vestibular Assessment - 07/01/21 1552   ? ?  ? Positional Sensitivities  ? Sit to Supine No dizziness   ? Supine to Left Side No dizziness   ? Supine to Right Side No dizziness   ? Supine to Sitting Lightheadedness   ? Nose to Right Knee No dizziness   ? Right Knee to Sitting No dizziness   ? Nose to Left Knee No dizziness   ? Left Knee to Sitting No dizziness   ? Head Turning x 5 No dizziness   ? Head Nodding x 5 No dizziness   ? Pivot Right in Standing No dizziness   ? Pivot Left in Standing No dizziness   ? Rolling Right No dizziness   ? Rolling Left No dizziness   ? ?  ?  ? ?  ? ?Access Code: [email or web address] ?URL: [email or web address] ?Date:  07/01/2021 ?Prepared by: Misty Stanley ? ?Exercises ?Heel Toe Raises with Unilateral Counter Support - 1 x daily - 7 x weekly - 2 sets - 10 reps ?Sit to Stand with Immediate Step - 1 x daily - 7 x weekly - 3-5 sets ?Romberg Stance on Foam Pad with Head Rotation - 1 x daily - 7 x weekly - 1 sets - 10 reps ?Feet Together Balance with Head Nods on Foam Pad - 1 x daily - 7 x weekly - 1 sets - 10 reps ?Supine Deep Neck Flexor Nods - 1 x daily - 7 x weekly - 2 sets - 10 reps - 6 seconds hold ?Clamshell with Resistance - 1 x daily - 7 x weekly - 2 sets - 10 reps ?Side Stepping with Resistance at Ankles and Counter Support - 1 x daily - 7 x weekly - 1 sets - 10 reps ?Seated Shoulder Horizontal Abduction with Resistance - 1 x daily - 7 x weekly - 2 sets - 10 reps ?Seated Shoulder Row with Anchored Resistance - 1 x daily - 7 x weekly - 2 sets - 10 reps ?Seated Shoulder Flexion with Resistance - 1 x daily - 7 x weekly - 2 sets - 10 reps ? ? ? ?06/29/2021: ?Therex:  ?Completed clamshell on bilateral sides, completed x 10 reps each with yellow band, then progressed to red band x 10 reps. Pt tolerating increase in resistance well. Added to HEP.   ? ?Bolded Added to HEP during today's session:  ?Access Code: [email or web address] ?URL: [email or web address] ?Date: 06/29/2021 ?Prepared by: Baldomero Lamy ? ?Exercises ?Heel Toe Raises with Unilateral Counter Support - 1 x daily - 7 x weekly - 2 sets - 10 reps ?Sit to Stand with Immediate Step - 1 x daily - 7 x weekly - 3-5 sets ?Romberg Stance on Foam Pad with Head Rotation - 1 x daily - 7 x weekly - 1 sets - 10 reps ?Feet Together Balance with Head Nods on Foam Pad - 1 x daily - 7 x weekly - 1 sets - 10 reps ?Supine Deep Neck Flexor Nods - 1 x daily - 7 x weekly - 2 sets - 10 reps - 6 seconds hold ?Clamshell with Resistance - 1 x daily - 7 x weekly - 2 sets - 10 reps - provided red theraband ? ? ?NMR: ?Standing on  blue mat on incline, completed staggered stance with EO and  horizontal/vertical head turns x 10 reps each. Then completed second set with alternating foot position. CGA required due to imbalance. Then with feet hip width, completed eyes closed 3 x 30 seconds each.  ? ? ?PATIENT EDUCATION: ?Education details:  ?Person educated: Patient ?Education method: Explanation, Demonstration, and Handouts ?Education comprehension: verbalized understanding ? ? ?HOME EXERCISE PROGRAM: ?Access Code: [email or web address] ?URL: [email or web address] ?Date: 07/01/2021 ?Prepared by: Misty Stanley ? ?Exercises ?Heel Toe Raises with Unilateral Counter Support - 1 x daily - 7 x weekly - 2 sets - 10 reps ?Sit to Stand with Immediate Step - 1 x daily - 7 x weekly - 3-5 sets ?Romberg Stance on Foam Pad with Head Rotation - 1 x daily - 7 x weekly - 1 sets - 10 reps ?Feet Together Balance with Head Nods on Foam Pad - 1 x daily - 7 x weekly - 1 sets - 10 reps ?Supine Deep Neck Flexor Nods - 1 x daily - 7 x weekly - 2 sets - 10 reps - 6 seconds hold ?Clamshell with Resistance - 1 x daily - 7 x weekly - 2 sets - 10 reps ?Side Stepping with Resistance at Ankles and Counter Support - 1 x daily - 7 x weekly - 1 sets - 10 reps ?Seated Shoulder Horizontal Abduction with Resistance - 1 x daily - 7 x weekly - 2 sets - 10 reps ?Seated Shoulder Row with Anchored Resistance - 1 x daily - 7 x weekly - 2 sets - 10 reps ?Seated Shoulder Flexion with Resistance - 1 x daily - 7 x weekly - 2 sets - 10 reps ? ? ? PT Short Term Goals - 05/27/21 2035   ? ?  ? PT SHORT TERM GOAL #1  ? Title = LTG   ? ?  ?  ? ?  ? ? ? PT Long Term Goals - 07/01/21 1559   ? ?  ? PT LONG TERM GOAL #1  ? Title Goals set at 4 weeks (3/11) but POC goes out 8 weeks (4/10) if needed   ?  ? PT LONG TERM GOAL #2  ? Title Pt will demonstrate independence with final HEP   ? Time 4   ? Period Weeks   ? Status Achieved  ? Target Date 06/26/21   ?  ? PT LONG TERM GOAL #3  ? Title Pt will demonstrate improvement in DGI to >/= 20/24   ? Baseline 18/24 >  20/24   ? Time 4   ? Period Weeks   ? Status Achieved   ? Target Date 06/26/21   ?  ? PT LONG TERM GOAL #4  ? Title Pt will report no symptoms of dizziness during supine > sit, repeated horizontal and vertical hea

## 2021-07-29 ENCOUNTER — Other Ambulatory Visit: Payer: Self-pay | Admitting: Internal Medicine

## 2021-07-30 NOTE — Telephone Encounter (Signed)
Please refill as per office routine med refill policy (all routine meds to be refilled for 3 mo or monthly (per pt preference) up to one year from last visit, then month to month grace period for 3 mo, then further med refills will have to be denied) ? ?

## 2021-08-10 ENCOUNTER — Ambulatory Visit (INDEPENDENT_AMBULATORY_CARE_PROVIDER_SITE_OTHER): Payer: Medicare Other

## 2021-08-10 DIAGNOSIS — Z0000 Encounter for general adult medical examination without abnormal findings: Secondary | ICD-10-CM | POA: Diagnosis not present

## 2021-08-10 NOTE — Patient Instructions (Signed)
Julia Norton , ?Thank you for taking time to come for your Medicare Wellness Visit. I appreciate your ongoing commitment to your health goals. Please review the following plan we discussed and let me know if I can assist you in the future.  ? ?Screening recommendations/referrals: ?Colonoscopy: Not a candidate for screening due to age ?Mammogram: 12/02/2020; due every year ?Bone Density: 08/20/2019; due every 2-3 years ?Recommended yearly ophthalmology/optometry visit for glaucoma screening and checkup ?Recommended yearly dental visit for hygiene and checkup ? ?Vaccinations: ?Influenza vaccine: 01/01/2021 ?Pneumococcal vaccine: 08/01/2006, 08/08/2019 ?Tdap vaccine: 08/08/2019; due every 10 years ?Shingles vaccine: never done; can check with pharmacy for vaccine   ?Covid-19: 06/01/2019, 06/26/2019, 02/04/2020, 11/03/2020, 03/25/2021 ? ?Advanced directives: Yes; Please bring a copy of your health care power of attorney and living will to the office at your convenience. ? ?Conditions/risks identified: Yes; Goal is to get back into the gym. ? ?Next appointment: Please schedule your next Medicare Wellness Visit with your Nurse Health Advisor in 1 year by calling [phone]. ? ? ?Preventive Care 30 Years and Older, Female ?Preventive care refers to lifestyle choices and visits with your health care provider that can promote health and wellness. ?What does preventive care include? ?A yearly physical exam. This is also called an annual well check. ?Dental exams once or twice a year. ?Routine eye exams. Ask your health care provider how often you should have your eyes checked. ?Personal lifestyle choices, including: ?Daily care of your teeth and gums. ?Regular physical activity. ?Eating a healthy diet. ?Avoiding tobacco and drug use. ?Limiting alcohol use. ?Practicing safe sex. ?Taking low-dose aspirin every day. ?Taking vitamin and mineral supplements as recommended by your health care provider. ?What happens during an annual well  check? ?The services and screenings done by your health care provider during your annual well check will depend on your age, overall health, lifestyle risk factors, and family history of disease. ?Counseling  ?Your health care provider may ask you questions about your: ?Alcohol use. ?Tobacco use. ?Drug use. ?Emotional well-being. ?Home and relationship well-being. ?Sexual activity. ?Eating habits. ?History of falls. ?Memory and ability to understand (cognition). ?Work and work Statistician. ?Reproductive health. ?Screening  ?You may have the following tests or measurements: ?Height, weight, and BMI. ?Blood pressure. ?Lipid and cholesterol levels. These may be checked every 5 years, or more frequently if you are over 16 years old. ?Skin check. ?Lung cancer screening. You may have this screening every year starting at age 82 if you have a 30-pack-year history of smoking and currently smoke or have quit within the past 15 years. ?Fecal occult blood test (FOBT) of the stool. You may have this test every year starting at age 37. ?Flexible sigmoidoscopy or colonoscopy. You may have a sigmoidoscopy every 5 years or a colonoscopy every 10 years starting at age 51. ?Hepatitis C blood test. ?Hepatitis B blood test. ?Sexually transmitted disease (STD) testing. ?Diabetes screening. This is done by checking your blood sugar (glucose) after you have not eaten for a while (fasting). You may have this done every 1-3 years. ?Bone density scan. This is done to screen for osteoporosis. You may have this done starting at age 15. ?Mammogram. This may be done every 1-2 years. Talk to your health care provider about how often you should have regular mammograms. ?Talk with your health care provider about your test results, treatment options, and if necessary, the need for more tests. ?Vaccines  ?Your health care provider may recommend certain vaccines, such as: ?Influenza vaccine.  This is recommended every year. ?Tetanus, diphtheria, and  acellular pertussis (Tdap, Td) vaccine. You may need a Td booster every 10 years. ?Zoster vaccine. You may need this after age 69. ?Pneumococcal 13-valent conjugate (PCV13) vaccine. One dose is recommended after age 55. ?Pneumococcal polysaccharide (PPSV23) vaccine. One dose is recommended after age 43. ?Talk to your health care provider about which screenings and vaccines you need and how often you need them. ?This information is not intended to replace advice given to you by your health care provider. Make sure you discuss any questions you have with your health care provider. ?Document Released: 05/01/2015 Document Revised: 12/23/2015 Document Reviewed: 02/03/2015 ?Elsevier Interactive Patient Education ? 2017 [place]. ? ?Fall Prevention in the Home ?Falls can cause injuries. They can happen to people of all ages. There are many things you can do to make your home safe and to help prevent falls. ?What can I do on the outside of my home? ?Regularly fix the edges of walkways and driveways and fix any cracks. ?Remove anything that might make you trip as you walk through a door, such as a raised step or threshold. ?Trim any bushes or trees on the path to your home. ?Use bright outdoor lighting. ?Clear any walking paths of anything that might make someone trip, such as rocks or tools. ?Regularly check to see if handrails are loose or broken. Make sure that both sides of any steps have handrails. ?Any raised decks and porches should have guardrails on the edges. ?Have any leaves, snow, or ice cleared regularly. ?Use sand or salt on walking paths during winter. ?Clean up any spills in your garage right away. This includes oil or grease spills. ?What can I do in the bathroom? ?Use night lights. ?Install grab bars by the toilet and in the tub and shower. Do not use towel bars as grab bars. ?Use non-skid mats or decals in the tub or shower. ?If you need to sit down in the shower, use a plastic, non-slip stool. ?Keep  the floor dry. Clean up any water that spills on the floor as soon as it happens. ?Remove soap buildup in the tub or shower regularly. ?Attach bath mats securely with double-sided non-slip rug tape. ?Do not have throw rugs and other things on the floor that can make you trip. ?What can I do in the bedroom? ?Use night lights. ?Make sure that you have a light by your bed that is easy to reach. ?Do not use any sheets or blankets that are too big for your bed. They should not hang down onto the floor. ?Have a firm chair that has side arms. You can use this for support while you get dressed. ?Do not have throw rugs and other things on the floor that can make you trip. ?What can I do in the kitchen? ?Clean up any spills right away. ?Avoid walking on wet floors. ?Keep items that you use a lot in easy-to-reach places. ?If you need to reach something above you, use a strong step stool that has a grab bar. ?Keep electrical cords out of the way. ?Do not use floor polish or wax that makes floors slippery. If you must use wax, use non-skid floor wax. ?Do not have throw rugs and other things on the floor that can make you trip. ?What can I do with my stairs? ?Do not leave any items on the stairs. ?Make sure that there are handrails on both sides of the stairs and use them. Fix handrails  that are broken or loose. Make sure that handrails are as long as the stairways. ?Check any carpeting to make sure that it is firmly attached to the stairs. Fix any carpet that is loose or worn. ?Avoid having throw rugs at the top or bottom of the stairs. If you do have throw rugs, attach them to the floor with carpet tape. ?Make sure that you have a light switch at the top of the stairs and the bottom of the stairs. If you do not have them, ask someone to add them for you. ?What else can I do to help prevent falls? ?Wear shoes that: ?Do not have high heels. ?Have rubber bottoms. ?Are comfortable and fit you well. ?Are closed at the toe. Do not  wear sandals. ?If you use a stepladder: ?Make sure that it is fully opened. Do not climb a closed stepladder. ?Make sure that both sides of the stepladder are locked into place. ?Ask someone to hold it for you

## 2021-08-10 NOTE — Progress Notes (Signed)
?I connected with Julia Norton today by telephone and verified that I am speaking with the correct person using two identifiers. ?Location patient: home ?Location provider: work ?Persons participating in the virtual visit: patient, provider. ?  ?I discussed the limitations, risks, security and privacy concerns of performing an evaluation and management service by telephone and the availability of in person appointments. I also discussed with the patient that there may be a patient responsible charge related to this service. The patient expressed understanding and verbally consented to this telephonic visit.  ?  ?Interactive audio and video telecommunications were attempted between this provider and patient, however failed, due to patient having technical difficulties OR patient did not have access to video capability.  We continued and completed visit with audio only. ? ?Some vital signs may be absent or patient reported.  ? ?Time Spent with patient on telephone encounter: 45 minutes ? ?Subjective:  ? Julia Norton is a 86 y.o. female who presents for an Initial Medicare Annual Wellness Visit. ? ?Review of Systems    ? ?Cardiac Risk Factors include: advanced age (>66mn, >>77women);hypertension;family history of premature cardiovascular disease ? ?   ?Objective:  ?  ?Today's Vitals  ? 08/10/21 1306  ?PainSc: 2   ? ?There is no height or weight on file to calculate BMI. ? ? ?  08/10/2021  ?  1:39 PM 03/26/2021  ? 11:56 AM 05/18/2020  ?  2:54 PM  ?Advanced Directives  ?Does Patient Have a Medical Advance Directive? Yes Yes Yes  ?Type of Advance Directive Living will;[place]care [place]  ?Does patient want to make changes to medical advance directive? No - Patient declined    ?Copy of [place]? No - copy requested    ? ? ?Current Medications (verified) ?Outpatient Encounter Medications as of  08/10/2021  ?Medication Sig  ? aspirin 81 MG tablet Take 2 tablets by mouth once daily  ? Cholecalciferol (VITAMIN D3) 25 MCG (1000 UT) CAPS Take by mouth daily.  ? hydrALAZINE (APRESOLINE) 50 MG tablet Take 1 tablet (50 mg total) by mouth 2 (two) times daily.  ? levothyroxine (SYNTHROID) 100 MCG tablet Take 1 tablet (100 mcg total) by mouth daily.  ? loratadine (CLARITIN) 10 MG tablet Take 10 mg by mouth daily.  ? Magnesium 400 MG CAPS Take by mouth.  ? Multiple Vitamin (MULTIVITAMIN PO) Take 1 tablet by mouth daily.  ? Omega-3 Fatty Acids (FISH OIL) 1000 MG CAPS Take 1 capsule by mouth daily.  ? solifenacin (VESICARE) 10 MG tablet TAKE 1 TABLET DAILY  ? valsartan-hydrochlorothiazide (DIOVAN-HCT) 320-25 MG tablet Take 1 tablet by mouth daily.  ? augmented betamethasone dipropionate (DIPROLENE-AF) 0.05 % cream Apply topically. 1-2 times per day (Patient not taking: Reported on 12/07/2020)  ? ?No facility-administered encounter medications on file as of 08/10/2021.  ? ? ?Allergies (verified) ?Penicillins  ? ?History: ?Past Medical History:  ?Diagnosis Date  ? Allergic rhinitis   ? Asthma   [place]  ? Erysipelas   ? Hemorrhoids   ? Hypertension   ? Hypothyroidism   ? ?Past Surgical History:  ?Procedure Laterality Date  ? BREAST LUMPECTOMY Right 1986  ? malignant  ? TONSILLECTOMY    ? ?Family History  ?Problem Relation Age of Onset  ? Celiac disease Sister   ? Heart disease Sister 662 ?     heart attack, died 869 ?  Arthritis Sister   ? COPD Sister   ? Hypertension Sister   ? Heart disease Mother   ?     CHF  ? Arthritis Mother   ? Hypertension Mother   ? Heart disease Father 81  ?     heart attack  ? Arthritis Father   ? ?Social History  ? ?Socioeconomic History  ? Marital status: Married  ?  Spouse name: Not on file  ? Number of children: 3  ? Years of education: Not on file  ? Highest education level: High school graduate  ?Occupational History  ? Not on file  ?Tobacco Use  ? Smoking status: Former  ?   Types: Cigarettes  ?  Quit date: 04/18/1961  ?  Years since quitting: 60.3  ? Smokeless tobacco: Never  ?Vaping Use  ? Vaping Use: Never used  ?Substance and Sexual Activity  ? Alcohol use: Never  ? Drug use: Never  ? Sexual activity: Not Currently  ?Other Topics Concern  ? Not on file  ?Social History Narrative  ? Lives with husband.  She has three children and 7 grands and one great.   ?   ? Caffeine: very little (chocolate), drinks decaf coffee  ? ?Social Determinants of Health  ? ?Financial Resource Strain: Low Risk   ? Difficulty of Paying Living Expenses: Not hard at all  ?Food Insecurity: No Food Insecurity  ? Worried About Charity fundraiser in the Last Year: Never true  ? Ran Out of Food in the Last Year: Never true  ?Transportation Needs: No Transportation Needs  ? Lack of Transportation (Medical): No  ? Lack of Transportation (Non-Medical): No  ?Physical Activity: Sufficiently Active  ? Days of Exercise per Week: 5 days  ? Minutes of Exercise per Session: 30 min  ?Stress: No Stress Concern Present  ? Feeling of Stress : Not at all  ?Social Connections: Moderately Isolated  ? Frequency of Communication with Friends and Family: More than three times a week  ? Frequency of Social Gatherings with Friends and Family: More than three times a week  ? Attends Religious Services: Never  ? Active Member of Clubs or Organizations: No  ? Attends Archivist Meetings: Never  ? Marital Status: Married  ? ? ?Tobacco Counseling ?Counseling given: Not Answered ? ? ?Clinical Intake: ? ?Pre-visit preparation completed: Yes ? ?Pain : 0-10 ?Pain Score: 2  ?Pain Type: Chronic pain ?Pain Location: Neck ?Pain Orientation: Right ?Pain Descriptors / Indicators: Discomfort, Other (Comment) (Not able to turn head to other side) ?Pain Onset: More than a month ago ?Pain Frequency: Intermittent ?Pain Relieving Factors: Aleve or Ibuprofen ? ?Pain Relieving Factors: Aleve or Ibuprofen ? ?Nutritional Risks: None ?Diabetes:  No ? ?How often do you need to have someone help you when you read instructions, pamphlets, or other written materials from your doctor or pharmacy?: 1 - Never ?What is the last grade level you completed in school?: HSG; college courses at [employer] ? ?Diabetic? no ? ?Interpreter Needed?: No ? ?Information entered by :: Lisette Abu, LPN ? ? ?Activities of Daily Living ? ?  08/10/2021  ?  1:48 PM 09/25/2020  ? 11:16 AM  ?In your present state of health, do you have any difficulty performing the following activities:  ?Hearing? 0 0  ?Vision? 0 0  ?Difficulty concentrating or making decisions? 0 0  ?Walking or climbing stairs? 0 1  ?Comment  due to vertigo  ?Dressing or bathing? 0 0  ?  Doing errands, shopping? 0 0  ?Preparing Food and eating ? N   ?Using the Toilet? N   ?In the past six months, have you accidently leaked urine? Y   ?Do you have problems with loss of bowel control? N   ?Managing your Medications? N   ?Managing your Finances? N   ?Housekeeping or managing your Housekeeping? N   ? ? ?Patient Care Team: ?Biagio Borg, MD as PCP - General (Internal Medicine) ?Luberta Mutter, MD as Consulting Physician (Ophthalmology) ? ?Indicate any recent Medical Services you may have received from other than [facility] providers in the past year (date may be approximate). ? ?   ?Assessment:  ? This is a routine wellness examination for Julia Norton. ? ?Hearing/Vision screen ?Hearing Screening - Comments:: Patient denied any hearing difficulty.   ?No hearing aids. ? ?Vision Screening - Comments:: Patient does wear corrective lenses/contacts.  ?Eye exam done by: Luberta Mutter, MD ? ? ?Dietary issues and exercise activities discussed: ?Current Exercise Habits: Home exercise routine, Type of exercise: walking;stretching;Other - see comments (PT exercises for her neck; balancing), Time (Minutes): 30, Frequency (Times/Week): 5, Weekly Exercise (Minutes/Week): 150, Intensity: Mild, Exercise limited by: respiratory  conditions(s) ? ? Goals Addressed   ? ?  ?  ?  ?  ? This Visit's Progress  ?  My goal is to get back in the gym.     ? ?  ?Depression Screen ? ?  08/10/2021  ?  1:29 PM 09/25/2020  ? 11:33 AM 09/25/2020  ? 11:16 AM 4

## 2021-10-28 ENCOUNTER — Telehealth: Payer: Self-pay | Admitting: Internal Medicine

## 2021-10-28 MED ORDER — VALSARTAN-HYDROCHLOROTHIAZIDE 320-25 MG PO TABS: 1.0000 | ORAL_TABLET | Freq: Every day | ORAL | 0 refills | Status: DC

## 2021-10-28 NOTE — Telephone Encounter (Signed)
Caller & Relationship to patient:self   Call back number:[phone]   Date of last office visit:   Date of next office visit:   Medication(s) to be refilled valsartan-hydrochlorothiazide:        Preferred Pharmacy: Pharmacy  [facility] [place], [place]

## 2021-11-03 ENCOUNTER — Other Ambulatory Visit: Payer: Self-pay | Admitting: Internal Medicine

## 2021-11-04 NOTE — Telephone Encounter (Signed)
Please refill as per office routine med refill policy (all routine meds to be refilled for 3 mo or monthly (per pt preference) up to one year from last visit, then month to month grace period for 3 mo, then further med refills will have to be denied) ? ?

## 2021-11-11 ENCOUNTER — Ambulatory Visit (INDEPENDENT_AMBULATORY_CARE_PROVIDER_SITE_OTHER): Payer: Medicare Other | Admitting: Internal Medicine

## 2021-11-11 VITALS — BP 144/86 | HR 64 | Temp 98.1°F | Ht 68.0 in | Wt 175.2 lb

## 2021-11-11 DIAGNOSIS — E559 Vitamin D deficiency, unspecified: Secondary | ICD-10-CM

## 2021-11-11 DIAGNOSIS — I872 Venous insufficiency (chronic) (peripheral): Secondary | ICD-10-CM

## 2021-11-11 DIAGNOSIS — E538 Deficiency of other specified B group vitamins: Secondary | ICD-10-CM

## 2021-11-11 DIAGNOSIS — R739 Hyperglycemia, unspecified: Secondary | ICD-10-CM | POA: Diagnosis not present

## 2021-11-11 DIAGNOSIS — I10 Essential (primary) hypertension: Secondary | ICD-10-CM

## 2021-11-11 DIAGNOSIS — R42 Dizziness and giddiness: Secondary | ICD-10-CM

## 2021-11-11 DIAGNOSIS — Z0001 Encounter for general adult medical examination with abnormal findings: Secondary | ICD-10-CM

## 2021-11-11 DIAGNOSIS — E039 Hypothyroidism, unspecified: Secondary | ICD-10-CM

## 2021-11-11 LAB — LIPID PANEL
Cholesterol: 191 mg/dL (ref 0–200)
HDL: 58.2 mg/dL (ref >39.00)
LDL Cholesterol: 118 mg/dL — ABNORMAL HIGH (ref 0–99)
NonHDL: 132.85
Total CHOL/HDL Ratio: 3
Triglycerides: 75 mg/dL (ref 0.0–149.0)
VLDL: 15 mg/dL (ref 0.0–40.0)

## 2021-11-11 LAB — CBC WITH DIFFERENTIAL/PLATELET
Basophils Absolute: 0.1 10*3/uL (ref 0.0–0.1)
Basophils Relative: 0.8 % (ref 0.0–3.0)
Eosinophils Absolute: 0.4 10*3/uL (ref 0.0–0.7)
Eosinophils Relative: 5.3 % — ABNORMAL HIGH (ref 0.0–5.0)
HCT: 40.7 % (ref 36.0–46.0)
Hemoglobin: 13.7 g/dL (ref 12.0–15.0)
Lymphocytes Relative: 17.9 % (ref 12.0–46.0)
Lymphs Abs: 1.4 10*3/uL (ref 0.7–4.0)
MCHC: 33.6 g/dL (ref 30.0–36.0)
MCV: 87.1 fl (ref 78.0–100.0)
Monocytes Absolute: 0.6 10*3/uL (ref 0.1–1.0)
Monocytes Relative: 7.9 % (ref 3.0–12.0)
Neutro Abs: 5.2 10*3/uL (ref 1.4–7.7)
Neutrophils Relative %: 68.1 % (ref 43.0–77.0)
Platelets: 180 10*3/uL (ref 150.0–400.0)
RBC: 4.68 Mil/uL (ref 3.87–5.11)
RDW: 14.5 % (ref 11.5–15.5)
WBC: 7.6 10*3/uL (ref 4.0–10.5)

## 2021-11-11 LAB — BASIC METABOLIC PANEL
BUN: 22 mg/dL (ref 6–23)
CO2: 31 mEq/L (ref 19–32)
Calcium: 9.4 mg/dL (ref 8.4–10.5)
Chloride: 98 mEq/L (ref 96–112)
Creatinine, Ser: 1.03 mg/dL (ref 0.40–1.20)
GFR: 49.96 mL/min — ABNORMAL LOW (ref >60.00)
Glucose, Bld: 88 mg/dL (ref 70–99)
Potassium: 3.9 mEq/L (ref 3.5–5.1)
Sodium: 136 mEq/L (ref 135–145)

## 2021-11-11 LAB — HEMOGLOBIN A1C: Hgb A1c MFr Bld: 5.1 % (ref 4.6–6.5)

## 2021-11-11 LAB — HEPATIC FUNCTION PANEL
ALT: 13 U/L (ref 0–35)
AST: 21 U/L (ref 0–37)
Albumin: 4.4 g/dL (ref 3.5–5.2)
Alkaline Phosphatase: 59 U/L (ref 39–117)
Bilirubin, Direct: 0.1 mg/dL (ref 0.0–0.3)
Total Bilirubin: 0.9 mg/dL (ref 0.2–1.2)
Total Protein: 6.7 g/dL (ref 6.0–8.3)

## 2021-11-11 LAB — URINALYSIS, ROUTINE W REFLEX MICROSCOPIC
Bilirubin Urine: NEGATIVE
Hgb urine dipstick: NEGATIVE
Ketones, ur: NEGATIVE
Nitrite: NEGATIVE
Specific Gravity, Urine: 1.01 (ref 1.000–1.030)
Total Protein, Urine: NEGATIVE
Urine Glucose: NEGATIVE
Urobilinogen, UA: 0.2 (ref 0.0–1.0)
pH: 7 (ref 5.0–8.0)

## 2021-11-11 LAB — TSH: TSH: 2.76 u[IU]/mL (ref 0.35–5.50)

## 2021-11-11 LAB — VITAMIN D 25 HYDROXY (VIT D DEFICIENCY, FRACTURES): VITD: 39.79 ng/mL (ref 30.00–100.00)

## 2021-11-11 LAB — VITAMIN B12: Vitamin B-12: 778 pg/mL (ref 211–911)

## 2021-11-11 NOTE — Patient Instructions (Addendum)
Please have your Shingrix (shingles) shots done at your local pharmacy.  Please continue all other medications as before, and refills have been done if requested.  Please have the pharmacy call with any other refills you may need.  Please continue your efforts at being more active, low cholesterol diet, and weight control.  You are otherwise up to date with prevention measures today.  Please keep your appointments with your specialists as you may have planned  Please go to the LAB at the blood drawing area for the tests to be done  You will be contacted by phone if any changes need to be made immediately.  Otherwise, you will receive a letter about your results with an explanation, but please check with MyChart first.  Please remember to sign up for MyChart if you have not done so, as this will be important to you in the future with finding out test results, communicating by private email, and scheduling acute appointments online when needed.  Please make an Appointment to return in 6 months, or sooner if needed, also with Lab Appointment for testing done 3-5 days before at the [facility] (so this is for TWO appointments - please see the scheduling desk as you leave)

## 2021-11-11 NOTE — Progress Notes (Signed)
Patient ID: Julia Norton, female   DOB: 1935-09-09, 86 y.o.   MRN: [id number]         Chief Complaint:: wellness exam and htn, venous insufficiency, low thyroid, chronic dizziness       HPI:  Julia Norton is a 86 y.o. female here for wellness exam; decliens covid booster, shingrix, o/w up to date                        Also Pt denies chest pain, increased sob or doe, wheezing, orthopnea, PND, palpitations, dizziness or syncope, but has had mild increased leg swelling in the past year worse in the pm, better in the AM.  BP is controlled at home per pt.    Pt denies polydipsia, polyuria, or new focal neuro s/s.    Pt denies fever, wt loss, night sweats, loss of appetite, or other constitutional symptoms     Wt Readings from Last 3 Encounters:  11/11/21 175 lb 4 oz (79.5 kg)  01/11/21 176 lb (79.8 kg)  01/01/21 178 lb (80.7 kg)   BP Readings from Last 3 Encounters:  11/11/21 (!) 144/86  06/16/21 (!) 159/83  01/11/21 (!) 151/77   Immunization History  Administered Date(s) Administered   DTaP 04/18/2006   Fluad Quad(high Dose 65+) 01/20/2020, 01/01/2021   Influenza Split 01/17/2019   PFIZER Comirnaty(Gray Top)Covid-19 Tri-Sucrose Vaccine 11/03/2020   PFIZER(Purple Top)SARS-COV-2 Vaccination 06/01/2019, 06/26/2019, 02/04/2020   Pfizer Covid-19 Vaccine Bivalent Booster 80yr & up 03/25/2021   Pneumococcal Conjugate-13 08/08/2019   Pneumococcal Polysaccharide-23 04/18/2006   Tdap 08/08/2019   There are no preventive care reminders to display for this patient.     Past Medical History:  Diagnosis Date   Allergic rhinitis    Asthma    [place]   Erysipelas    Hemorrhoids    Hypertension    Hypothyroidism    Past Surgical History:  Procedure Laterality Date   BREAST LUMPECTOMY Right 1986   malignant   TONSILLECTOMY      reports that she quit smoking about 60 years ago. Her smoking use included cigarettes. She has never used smokeless tobacco. She  reports that she does not drink alcohol and does not use drugs. family history includes Arthritis in her father, mother, and sister; COPD in her sister; Celiac disease in her sister; Heart disease in her mother; Heart disease (age of onset: 571 in her father; Heart disease (age of onset: 687 in her sister; Hypertension in her mother and sister. Allergies  Allergen Reactions   Penicillins     Does not remember the reaction to medication.   Current Outpatient Medications on File Prior to Visit  Medication Sig Dispense Refill   aspirin 81 MG tablet Take 2 tablets by mouth once daily     augmented betamethasone dipropionate (DIPROLENE-AF) 0.05 % cream Apply topically. 1-2 times per day     Cholecalciferol (VITAMIN D3) 25 MCG (1000 UT) CAPS Take by mouth daily.     hydrALAZINE (APRESOLINE) 50 MG tablet Take 1 tablet (50 mg total) by mouth 2 (two) times daily. 180 tablet 3   levothyroxine (SYNTHROID) 100 MCG tablet Take 1 tablet (100 mcg total) by mouth daily. 90 tablet 3   loratadine (CLARITIN) 10 MG tablet Take 10 mg by mouth daily.     Magnesium 400 MG CAPS Take by mouth.     Multiple Vitamin (MULTIVITAMIN PO) Take 1 tablet by mouth daily.  Omega-3 Fatty Acids (FISH OIL) 1000 MG CAPS Take 1 capsule by mouth daily.     solifenacin (VESICARE) 10 MG tablet TAKE 1 TABLET DAILY 90 tablet 1   valsartan-hydrochlorothiazide (DIOVAN-HCT) 320-25 MG tablet Take 1 tablet by mouth daily. Annual appt is due in must see provider for future refills 90 tablet 0   No current facility-administered medications on file prior to visit.        ROS:  All others reviewed and negative.  Objective        PE:  BP (!) 144/86   Pulse 64   Temp 98.1 F (36.7 C) (Oral)   Ht '5\' 8"'$  (1.727 m)   Wt 175 lb 4 oz (79.5 kg)   SpO2 93%   BMI 26.65 kg/m                 Constitutional: Pt appears in NAD               HENT: Head: NCAT.                Right Ear: External ear normal.                 Left Ear: External ear  normal.                Eyes: . Pupils are equal, round, and reactive to light. Conjunctivae and EOM are normal               Nose: without d/c or deformity               Neck: Neck supple. Gross normal ROM               Cardiovascular: Normal rate and regular rhythm.                 Pulmonary/Chest: Effort normal and breath sounds without rales or wheezing.                Abd:  Soft, NT, ND, + BS, no organomegaly               Neurological: Pt is alert. At baseline orientation, motor grossly intact               Skin: Skin is warm. No rashes, no other new lesions, LE edema - trace bilateral               Psychiatric: Pt behavior is normal without agitation   Micro: none  Cardiac tracings I have personally interpreted today:  none  Pertinent Radiological findings (summarize): none   Lab Results  Component Value Date   WBC 7.6 11/11/2021   HGB 13.7 11/11/2021   HCT 40.7 11/11/2021   PLT 180.0 11/11/2021   GLUCOSE 88 11/11/2021   CHOL 191 11/11/2021   TRIG 75.0 11/11/2021   HDL 58.20 11/11/2021   LDLCALC 118 (H) 11/11/2021   ALT 13 11/11/2021   AST 21 11/11/2021   NA 136 11/11/2021   K 3.9 11/11/2021   CL 98 11/11/2021   CREATININE 1.03 11/11/2021   BUN 22 11/11/2021   CO2 31 11/11/2021   TSH 2.76 11/11/2021   HGBA1C 5.1 11/11/2021   Assessment/Plan:  Julia Norton is a 86 y.o. White or Caucasian [1] female with  has a past medical history of Allergic rhinitis, Asthma, Breast cancer ([place]) (1987), Erysipelas, Hemorrhoids, Hypertension, and Hypothyroidism.  Dizziness Chronic recurrent, has seen neurology, cont to follow  Encounter for well  adult exam with abnormal findings Age and sex appropriate education and counseling updated with regular exercise and diet Referrals for preventative services - none needed Immunizations addressed - declines covid booster, shingrix Smoking counseling  - none needed Evidence for depression or other mood disorder - none  significant Most recent labs reviewed. I have personally reviewed and have noted: 1) the patient's medical and social history 2) The patient's current medications and supplements 3) The patient's height, weight, and BMI have been recorded in the chart   Venous insufficiency Also for compression stockings bilateral daily during daytime  Hypothyroidism Lab Results  Component Value Date   TSH 2.76 11/11/2021   Stable, pt to continue levothyroxine 100 mcg qd   Hypertension BP Readings from Last 3 Encounters:  11/11/21 (!) 144/86  06/16/21 (!) 159/83  01/11/21 (!) 151/77   Persistently uncontrolled here, pt continues to state controlled at home,, pt to continue medical treatment diovan hct 320-25 qd   Followup: Return in about 6 months (around 05/14/2022).  Cathlean Cower, MD 11/13/2021 3:28 PM [facility]

## 2021-11-11 NOTE — Assessment & Plan Note (Signed)
Chronic recurrent, has seen neurology, cont to follow

## 2021-11-13 ENCOUNTER — Encounter: Payer: Self-pay | Admitting: Internal Medicine

## 2021-11-13 NOTE — Assessment & Plan Note (Signed)
Also for compression stockings bilateral daily during daytime

## 2021-11-13 NOTE — Assessment & Plan Note (Signed)
BP Readings from Last 3 Encounters:  11/11/21 (!) 144/86  06/16/21 (!) 159/83  01/11/21 (!) 151/77   Persistently uncontrolled here, pt continues to state controlled at home,, pt to continue medical treatment diovan hct 320-25 qd

## 2021-11-13 NOTE — Assessment & Plan Note (Signed)

## 2021-11-13 NOTE — Assessment & Plan Note (Signed)
Lab Results  Component Value Date   TSH 2.76 11/11/2021   Stable, pt to continue levothyroxine 100 mcg qd

## 2021-12-13 ENCOUNTER — Telehealth: Payer: Self-pay | Admitting: Internal Medicine

## 2021-12-13 NOTE — Telephone Encounter (Signed)
Caller & Relationship to patient: Makayia- self  Call back number: [phone]  Date of last office visit: 11-11-21  Date of next office visit: 05-17-22  Medication(s) to be refilled: levothyroxine (SYNTHROID) 100 MCG tablet  Requesting 90 day supply  Preferred Pharmacy: [place], [place] to [facility]

## 2021-12-14 MED ORDER — LEVOTHYROXINE SODIUM 100 MCG PO TABS: 100.0000 ug | ORAL_TABLET | Freq: Every day | ORAL | 1 refills | Status: DC

## 2021-12-15 ENCOUNTER — Other Ambulatory Visit: Payer: Self-pay | Admitting: Internal Medicine

## 2021-12-21 NOTE — Telephone Encounter (Signed)
Aaron Edelman with [facility] has question regarding this RX - please return call at [phone] option 2 and give reference number [id number]

## 2021-12-22 NOTE — Telephone Encounter (Signed)
Returned call to [facility] and spoke with Carver Fila, who is a Occupational psychologist and he stated that there is a charge for the patient on the levothyroxine and now needs consent to change to DAW5.

## 2021-12-22 NOTE — Telephone Encounter (Signed)
[place] for routine refill with DAW box checked thanks

## 2021-12-23 NOTE — Telephone Encounter (Signed)
Pharmacy notified.

## 2022-01-03 ENCOUNTER — Other Ambulatory Visit: Payer: Self-pay | Admitting: Internal Medicine

## 2022-01-03 DIAGNOSIS — Z1231 Encounter for screening mammogram for malignant neoplasm of breast: Secondary | ICD-10-CM

## 2022-01-26 ENCOUNTER — Ambulatory Visit
Admission: RE | Admit: 2022-01-26 | Discharge: 2022-01-26 | Disposition: A | Discharge status: Home / Self Care | Payer: Medicare Other | Source: Ambulatory Visit | Attending: Internal Medicine | Admitting: Internal Medicine

## 2022-01-26 DIAGNOSIS — Z1231 Encounter for screening mammogram for malignant neoplasm of breast: Secondary | ICD-10-CM

## 2022-01-28 ENCOUNTER — Other Ambulatory Visit: Payer: Self-pay | Admitting: Internal Medicine

## 2022-01-28 ENCOUNTER — Encounter: Payer: Self-pay | Admitting: Internal Medicine

## 2022-01-28 DIAGNOSIS — R928 Other abnormal and inconclusive findings on diagnostic imaging of breast: Secondary | ICD-10-CM

## 2022-01-30 IMAGING — MG MM DIGITAL SCREENING BILAT W/ TOMO AND CAD
8 series · 8 of 24 positions shown · non-contrast
Comparison: Previous exam(s).

CLINICAL DATA: Screening.

EXAM:
DIGITAL SCREENING BILATERAL MAMMOGRAM WITH TOMOSYNTHESIS AND CAD
TECHNIQUE: Bilateral screening digital craniocaudal and mediolateral oblique
mammograms were obtained. Bilateral screening digital breast
tomosynthesis was performed. The images were evaluated with
computer-aided detection.

[L CC synth-2D]
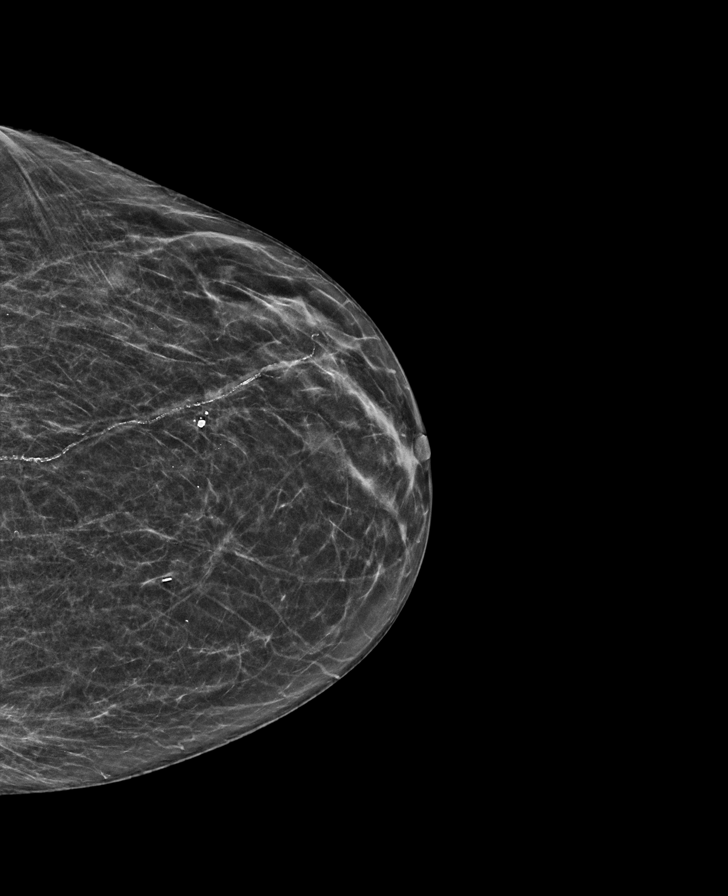

[L MLO synth-2D]
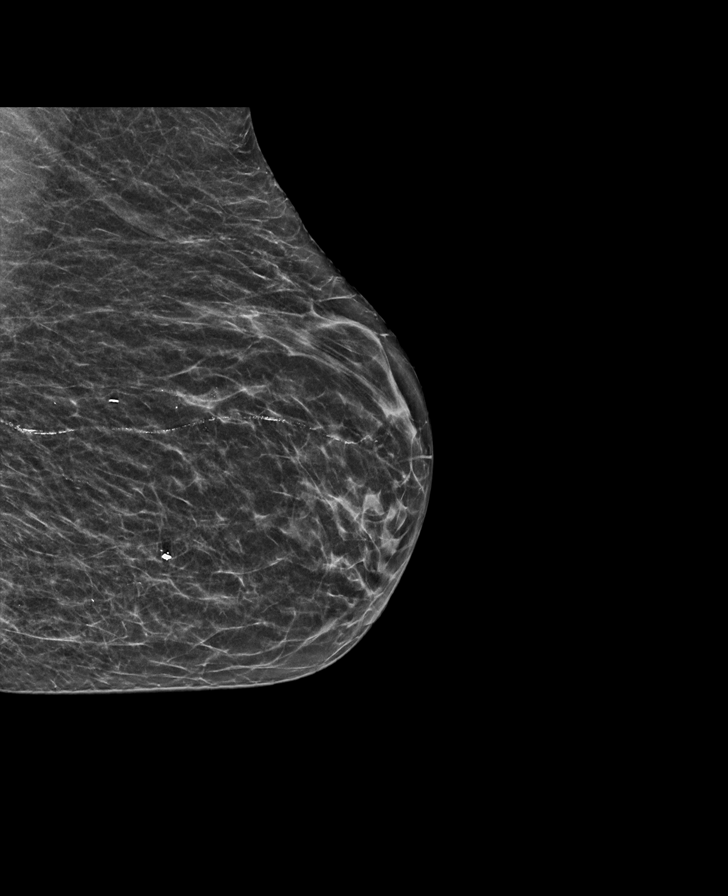

[R MLO synth-2D]
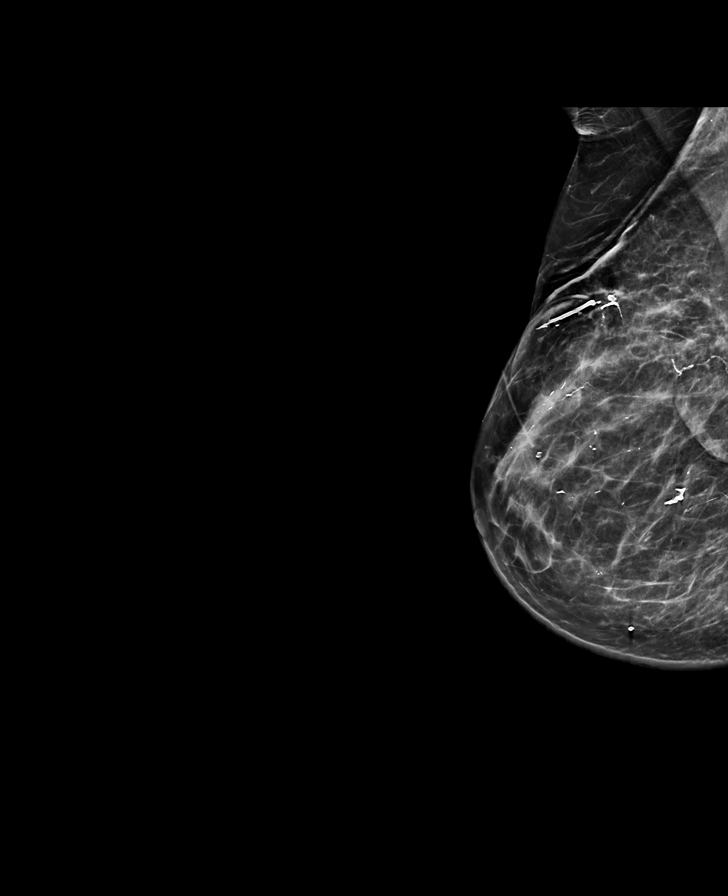

[R CC synth-2D]
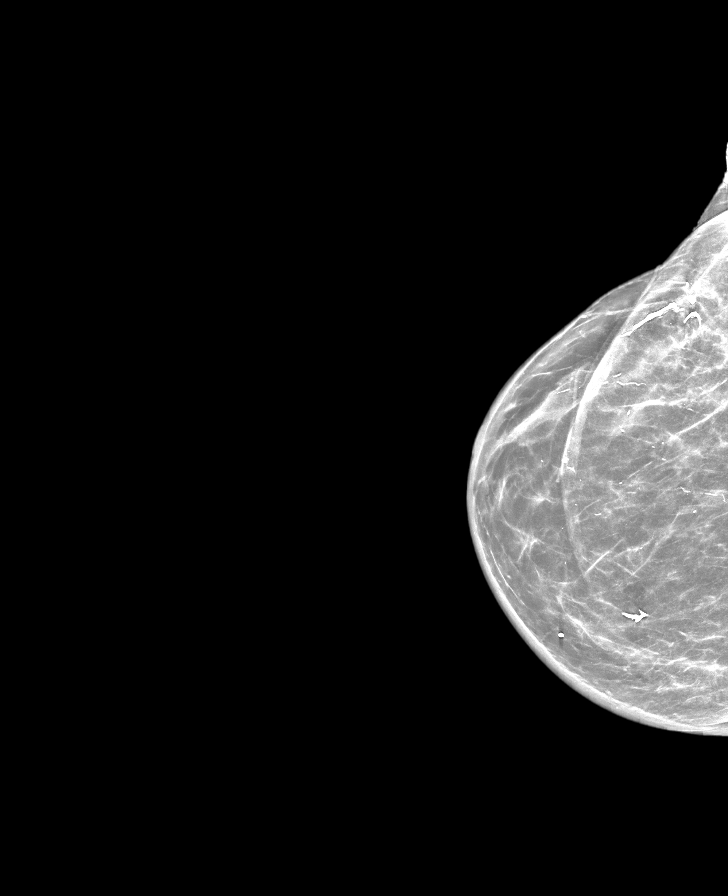

[R CC tomo · tomo slice 33/65.0]
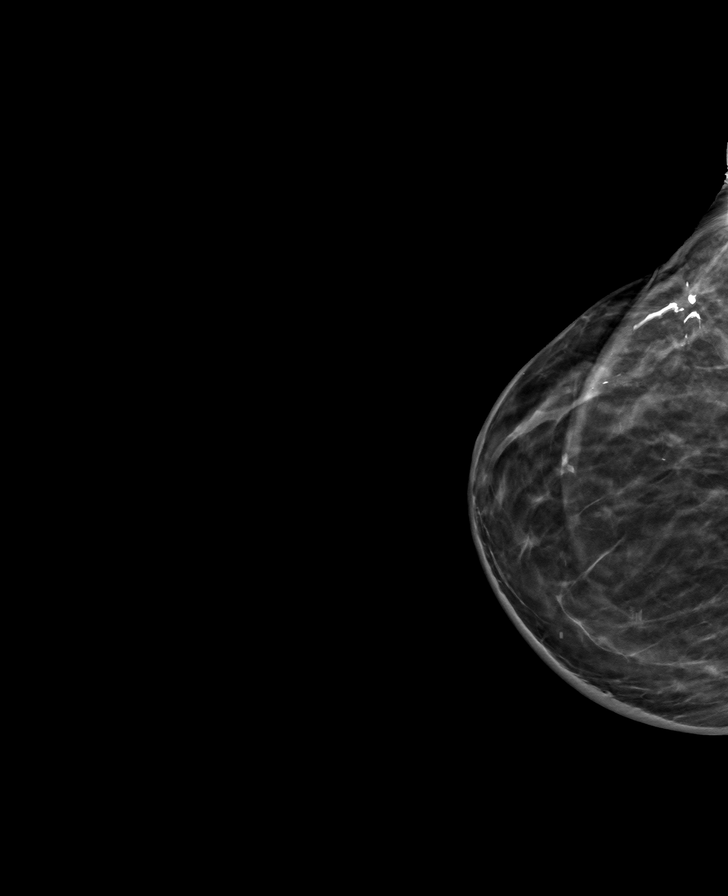

[L CC tomo · tomo slice 25/49.0]
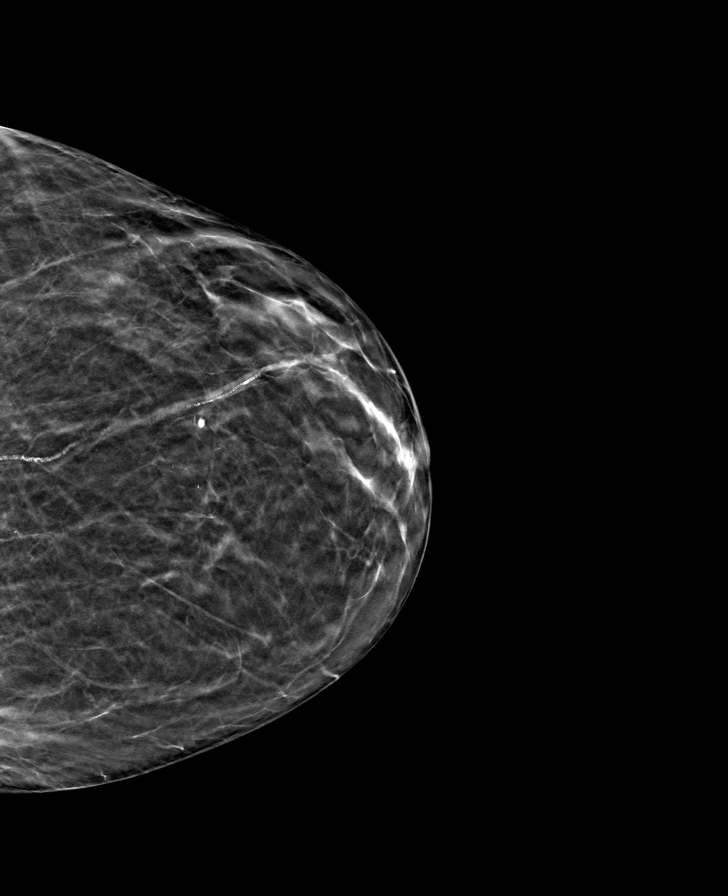

[L MLO tomo · tomo slice 26/51.0]
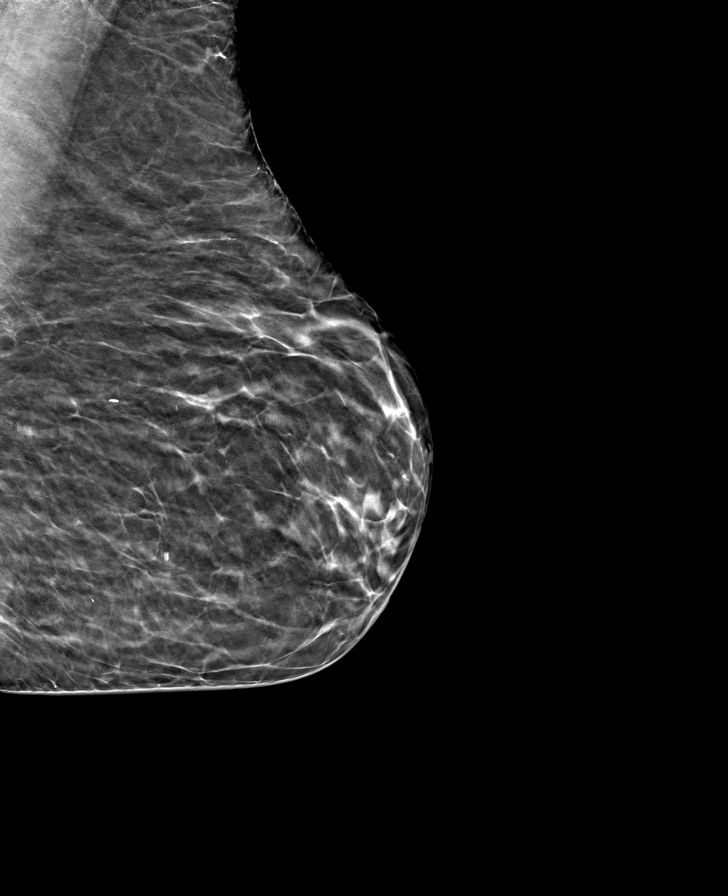

[R MLO tomo · tomo slice 34/67.0]
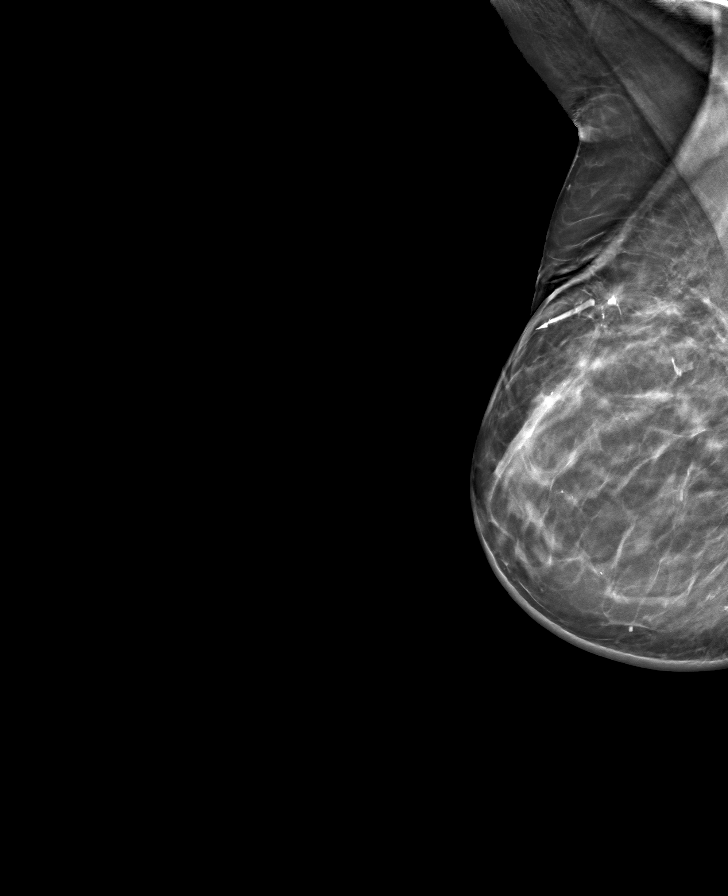

[8 of 24 positions shown; findings below may reference images not displayed]

ACR Breast Density Category b: There are scattered areas of
fibroglandular density.
FINDINGS: There are no findings suspicious for malignancy.
IMPRESSION: No mammographic evidence of malignancy. A result letter of this
screening mammogram will be mailed directly to the patient.

RECOMMENDATION:
Screening mammogram in one year. (Code:51-O-LD2)

BI-RADS CATEGORY  1: Negative.

## 2022-02-09 ENCOUNTER — Ambulatory Visit
Admission: RE | Admit: 2022-02-09 | Discharge: 2022-02-09 | Disposition: A | Discharge status: Home / Self Care | Payer: Medicare Other | Source: Ambulatory Visit | Attending: Internal Medicine | Admitting: Internal Medicine

## 2022-02-09 ENCOUNTER — Other Ambulatory Visit: Payer: Self-pay | Admitting: Internal Medicine

## 2022-02-09 DIAGNOSIS — R928 Other abnormal and inconclusive findings on diagnostic imaging of breast: Secondary | ICD-10-CM

## 2022-02-09 DIAGNOSIS — R921 Mammographic calcification found on diagnostic imaging of breast: Secondary | ICD-10-CM

## 2022-03-03 ENCOUNTER — Telehealth: Payer: Self-pay | Admitting: Internal Medicine

## 2022-03-03 NOTE — Telephone Encounter (Signed)
Patient would like refills on her vescicare and her Apresoline - [facility]  Last visit:  10/2021  Next visit:  04/2022

## 2022-03-04 ENCOUNTER — Telehealth: Payer: Self-pay | Admitting: Cardiology

## 2022-03-04 ENCOUNTER — Other Ambulatory Visit: Payer: Self-pay

## 2022-03-04 MED ORDER — HYDRALAZINE HCL 50 MG PO TABS: 50.0000 mg | ORAL_TABLET | Freq: Two times a day (BID) | ORAL | 3 refills | Status: DC

## 2022-03-04 MED ORDER — SOLIFENACIN SUCCINATE 10 MG PO TABS: 10.0000 mg | ORAL_TABLET | Freq: Every day | ORAL | 1 refills | Status: DC

## 2022-03-04 NOTE — Telephone Encounter (Signed)
Vescicare sent to pharmacy of choice, patient will call heart doctor for Apresoline.

## 2022-03-04 NOTE — Telephone Encounter (Signed)
Please see below.

## 2022-03-04 NOTE — Telephone Encounter (Signed)
*  STAT* If patient is at the pharmacy, call can be transferred to refill team.   1. Which medications need to be refilled? (please list name of each medication and dose if known)    hydrALAZINE (APRESOLINE) 50 MG tablet    2. Which pharmacy/location (including street and city if local pharmacy) is medication to be sent to? [facility] [place] [facility]   3. Do they need a 30 day or 90 day supply? [place]

## 2022-04-04 ENCOUNTER — Telehealth: Payer: Self-pay | Admitting: Internal Medicine

## 2022-04-04 ENCOUNTER — Other Ambulatory Visit: Payer: Self-pay

## 2022-04-04 MED ORDER — LEVOTHYROXINE SODIUM 100 MCG PO TABS: 100.0000 ug | ORAL_TABLET | Freq: Every day | ORAL | 1 refills | Status: DC

## 2022-04-04 NOTE — Telephone Encounter (Signed)
Out of med for 1 wk would not cause significant symptoms, but be sure to restart when she can.   thanks

## 2022-04-04 NOTE — Telephone Encounter (Signed)
Patient needs her synthroid '100mg'$ . [facility] - she has been out of her medication for a week - she wants to know if this will make a difference?  Please call patient at :  [phone]

## 2022-04-04 NOTE — Telephone Encounter (Signed)
Patient states that a new rx for year needs to be called in for levothyroxine 100 mcg.  Please send pharmacy  :  [facility]  Last visit:

## 2022-04-04 NOTE — Telephone Encounter (Signed)
Caller & Relationship to patient:  Patient   Call back number:     Date of last office visit:   Date of next office visit:   Medication(s) to be refilled:        Preferred Pharmacy:

## 2022-04-04 NOTE — Telephone Encounter (Signed)
Rx sent to pharmacy, patient states she hasn't taken it in a week and would like to know if that would make a difference?

## 2022-04-05 NOTE — Telephone Encounter (Signed)
Left message regarding a week of missed medication

## 2022-04-06 MED ORDER — LEVOTHYROXINE SODIUM 100 MCG PO TABS: 100.0000 ug | ORAL_TABLET | Freq: Every day | ORAL | 1 refills | Status: DC

## 2022-04-06 NOTE — Telephone Encounter (Signed)
[facility] called and said that medication was written incorrectly and was put in for no substitution and they need it written to be able to substitute and also for a 90/sp   They also wanted to see if we can send a 30d/s of the substitution allowed script for levothyroxine to  [place], [place] Phone: [phone]  Fax: [phone]

## 2022-04-26 ENCOUNTER — Other Ambulatory Visit: Payer: Self-pay | Admitting: Internal Medicine

## 2022-04-27 MED ORDER — VALSARTAN-HYDROCHLOROTHIAZIDE 320-25 MG PO TABS: 1.0000 | ORAL_TABLET | Freq: Every day | ORAL | 1 refills | Status: DC

## 2022-05-17 ENCOUNTER — Encounter: Payer: Self-pay | Admitting: Internal Medicine

## 2022-05-17 ENCOUNTER — Ambulatory Visit (INDEPENDENT_AMBULATORY_CARE_PROVIDER_SITE_OTHER): Payer: Medicare Other | Admitting: Internal Medicine

## 2022-05-17 VITALS — BP 122/62 | HR 64 | Temp 98.2°F | Ht 68.0 in | Wt 174.0 lb

## 2022-05-17 DIAGNOSIS — Z0001 Encounter for general adult medical examination with abnormal findings: Secondary | ICD-10-CM | POA: Diagnosis not present

## 2022-05-17 DIAGNOSIS — E039 Hypothyroidism, unspecified: Secondary | ICD-10-CM

## 2022-05-17 DIAGNOSIS — E538 Deficiency of other specified B group vitamins: Secondary | ICD-10-CM | POA: Diagnosis not present

## 2022-05-17 DIAGNOSIS — R739 Hyperglycemia, unspecified: Secondary | ICD-10-CM | POA: Diagnosis not present

## 2022-05-17 DIAGNOSIS — E559 Vitamin D deficiency, unspecified: Secondary | ICD-10-CM

## 2022-05-17 DIAGNOSIS — N1831 Chronic kidney disease, stage 3a: Secondary | ICD-10-CM

## 2022-05-17 DIAGNOSIS — R3129 Other microscopic hematuria: Secondary | ICD-10-CM | POA: Insufficient documentation

## 2022-05-17 DIAGNOSIS — I10 Essential (primary) hypertension: Secondary | ICD-10-CM

## 2022-05-17 DIAGNOSIS — R42 Dizziness and giddiness: Secondary | ICD-10-CM | POA: Diagnosis not present

## 2022-05-17 DIAGNOSIS — K5909 Other constipation: Secondary | ICD-10-CM | POA: Insufficient documentation

## 2022-05-17 DIAGNOSIS — E7800 Pure hypercholesterolemia, unspecified: Secondary | ICD-10-CM | POA: Insufficient documentation

## 2022-05-17 DIAGNOSIS — N183 Chronic kidney disease, stage 3 unspecified: Secondary | ICD-10-CM | POA: Insufficient documentation

## 2022-05-17 DIAGNOSIS — I878 Other specified disorders of veins: Secondary | ICD-10-CM | POA: Insufficient documentation

## 2022-05-17 DIAGNOSIS — Z780 Asymptomatic menopausal state: Secondary | ICD-10-CM | POA: Insufficient documentation

## 2022-05-17 DIAGNOSIS — R21 Rash and other nonspecific skin eruption: Secondary | ICD-10-CM | POA: Insufficient documentation

## 2022-05-17 LAB — CBC WITH DIFFERENTIAL/PLATELET
Basophils Absolute: 0.1 10*3/uL (ref 0.0–0.1)
Basophils Relative: 0.7 % (ref 0.0–3.0)
Eosinophils Absolute: 0.4 10*3/uL (ref 0.0–0.7)
Eosinophils Relative: 5.2 % — ABNORMAL HIGH (ref 0.0–5.0)
HCT: 40 % (ref 36.0–46.0)
Hemoglobin: 13.8 g/dL (ref 12.0–15.0)
Lymphocytes Relative: 16.9 % (ref 12.0–46.0)
Lymphs Abs: 1.3 10*3/uL (ref 0.7–4.0)
MCHC: 34.5 g/dL (ref 30.0–36.0)
MCV: 86 fl (ref 78.0–100.0)
Monocytes Absolute: 0.6 10*3/uL (ref 0.1–1.0)
Monocytes Relative: 7.7 % (ref 3.0–12.0)
Neutro Abs: 5.4 10*3/uL (ref 1.4–7.7)
Neutrophils Relative %: 69.5 % (ref 43.0–77.0)
Platelets: 210 10*3/uL (ref 150.0–400.0)
RBC: 4.65 Mil/uL (ref 3.87–5.11)
RDW: 15 % (ref 11.5–15.5)
WBC: 7.8 10*3/uL (ref 4.0–10.5)

## 2022-05-17 LAB — LIPID PANEL
Cholesterol: 202 mg/dL — ABNORMAL HIGH (ref 0–200)
HDL: 62.7 mg/dL (ref >39.00)
LDL Cholesterol: 127 mg/dL — ABNORMAL HIGH (ref 0–99)
NonHDL: 139.46
Total CHOL/HDL Ratio: 3
Triglycerides: 61 mg/dL (ref 0.0–149.0)
VLDL: 12.2 mg/dL (ref 0.0–40.0)

## 2022-05-17 LAB — HEPATIC FUNCTION PANEL
ALT: 15 U/L (ref 0–35)
AST: 23 U/L (ref 0–37)
Albumin: 4.3 g/dL (ref 3.5–5.2)
Alkaline Phosphatase: 61 U/L (ref 39–117)
Bilirubin, Direct: 0.2 mg/dL (ref 0.0–0.3)
Total Bilirubin: 0.8 mg/dL (ref 0.2–1.2)
Total Protein: 6.6 g/dL (ref 6.0–8.3)

## 2022-05-17 LAB — URINALYSIS, ROUTINE W REFLEX MICROSCOPIC
Bilirubin Urine: NEGATIVE
Hgb urine dipstick: NEGATIVE
Ketones, ur: NEGATIVE
Leukocytes,Ua: NEGATIVE
Nitrite: NEGATIVE
RBC / HPF: NONE SEEN (ref 0–?)
Specific Gravity, Urine: 1.01 (ref 1.000–1.030)
Total Protein, Urine: NEGATIVE
Urine Glucose: NEGATIVE
Urobilinogen, UA: 0.2 (ref 0.0–1.0)
pH: 7 (ref 5.0–8.0)

## 2022-05-17 LAB — BASIC METABOLIC PANEL
BUN: 23 mg/dL (ref 6–23)
CO2: 31 mEq/L (ref 19–32)
Calcium: 9.5 mg/dL (ref 8.4–10.5)
Chloride: 96 mEq/L (ref 96–112)
Creatinine, Ser: 1.01 mg/dL (ref 0.40–1.20)
GFR: 50.97 mL/min — ABNORMAL LOW (ref >60.00)
Glucose, Bld: 87 mg/dL (ref 70–99)
Potassium: 4.2 mEq/L (ref 3.5–5.1)
Sodium: 135 mEq/L (ref 135–145)

## 2022-05-17 LAB — HEMOGLOBIN A1C: Hgb A1c MFr Bld: 4.8 % (ref 4.6–6.5)

## 2022-05-17 LAB — TSH: TSH: 5.29 u[IU]/mL (ref 0.35–5.50)

## 2022-05-17 LAB — VITAMIN B12: Vitamin B-12: 617 pg/mL (ref 211–911)

## 2022-05-17 LAB — VITAMIN D 25 HYDROXY (VIT D DEFICIENCY, FRACTURES): VITD: 43.71 ng/mL (ref 30.00–100.00)

## 2022-05-17 MED ORDER — MECLIZINE HCL 12.5 MG PO TABS: 12.5000 mg | ORAL_TABLET | Freq: Three times a day (TID) | ORAL | 2 refills | Status: AC | PRN

## 2022-05-17 NOTE — Progress Notes (Signed)
Patient ID: MARINDA TYER, female   DOB: Nov 24, 1935, 87 y.o.   MRN: [id number]         Chief Complaint:: wellness exam and vertigo, CKD3a, htn, low thyroid       HPI:  NONIE LOCHNER is a 87 y.o. female here for wellness exam; declines covid booster, for shingrix at the pharmacy, o/w up to date                        Also Pt denies chest pain, increased sob or doe, wheezing, orthopnea, PND, increased LE swelling, palpitations, or syncope, but has 1 week recurring positional room spinning such as turning over in bed..   Pt denies polydipsia, polyuria, or new focal neuro s/s.  Pt denies fever, wt loss, night sweats, loss of appetite, or other constitutional symptoms  Denies hyper or hypo thyroid symptoms such as voice, skin or hair change.   Wt Readings from Last 3 Encounters:  05/17/22 174 lb (78.9 kg)  11/11/21 175 lb 4 oz (79.5 kg)  01/11/21 176 lb (79.8 kg)   BP Readings from Last 3 Encounters:  05/17/22 122/62  11/11/21 (!) 144/86  06/16/21 (!) 159/83   Immunization History  Administered Date(s) Administered   DTaP 04/18/2006   Fluad Quad(high Dose 65+) 01/20/2020, 01/01/2021, 01/12/2022   Influenza Split 01/17/2019   Influenza, High Dose Seasonal PF 01/15/2015, 01/29/2016   Influenza, Quadrivalent, Recombinant, Inj, Pf 01/30/2017, 01/26/2018, 12/14/2018   Influenza-Unspecified 01/21/2011, 01/16/2012, 01/03/2014   PFIZER Comirnaty(Gray Top)Covid-19 Tri-Sucrose Vaccine 11/03/2020   PFIZER(Purple Top)SARS-COV-2 Vaccination 06/01/2019, 06/26/2019, 02/04/2020   Pfizer Covid-19 Vaccine Bivalent Booster 53yr & up 03/25/2021   Pneumococcal Conjugate-13 08/08/2019   Pneumococcal Polysaccharide-23 04/18/2006, 08/01/2006   Td 06/06/2006   Tdap 08/08/2019   Unspecified SARS-COV-2 Vaccination 01/12/2022  There are no preventive care reminders to display for this patient.    Past Medical History:  Diagnosis Date   Allergic rhinitis    Asthma    [place]    Erysipelas    Hemorrhoids    Hypertension    Hypothyroidism    Past Surgical History:  Procedure Laterality Date   BREAST LUMPECTOMY Right 1986   malignant   TONSILLECTOMY      reports that she quit smoking about 61 years ago. Her smoking use included cigarettes. She has never used smokeless tobacco. She reports that she does not drink alcohol and does not use drugs. family history includes Arthritis in her father, mother, and sister; COPD in her sister; Celiac disease in her sister; Heart disease in her mother; Heart disease (age of onset: 523 in her father; Heart disease (age of onset: 651 in her sister; Hypertension in her mother and sister. Allergies  Allergen Reactions   Penicillins     Does not remember the reaction to medication.   Current Outpatient Medications on File Prior to Visit  Medication Sig Dispense Refill   aspirin 81 MG tablet Take 2 tablets by mouth once daily     augmented betamethasone dipropionate (DIPROLENE-AF) 0.05 % cream Apply topically. 1-2 times per day     Cholecalciferol (VITAMIN D3) 25 MCG (1000 UT) CAPS Take by mouth daily.     hydrALAZINE (APRESOLINE) 50 MG tablet Take 1 tablet (50 mg total) by mouth 2 (two) times daily. 180 tablet 3   levothyroxine (SYNTHROID) 100 MCG tablet Take 1 tablet (100 mcg total) by mouth daily. 90 tablet 1   loratadine (CLARITIN) 10 MG tablet  Take 10 mg by mouth daily.     Magnesium 400 MG CAPS Take by mouth.     Multiple Vitamin (MULTIVITAMIN PO) Take 1 tablet by mouth daily.     Omega-3 Fatty Acids (FISH OIL) 1000 MG CAPS Take 1 capsule by mouth daily.     solifenacin (VESICARE) 10 MG tablet Take 1 tablet (10 mg total) by mouth daily. 90 tablet 1   valsartan-hydrochlorothiazide (DIOVAN-HCT) 320-25 MG tablet Take 1 tablet by mouth daily. 90 tablet 1   No current facility-administered medications on file prior to visit.        ROS:  All others reviewed and negative.  Objective        PE:  BP 122/62 (BP Location: Right  Arm, Patient Position: Sitting, Cuff Size: Large)   Pulse 64   Temp 98.2 F (36.8 C) (Oral)   Ht '5\' 8"'$  (1.727 m)   Wt 174 lb (78.9 kg)   SpO2 95%   BMI 26.46 kg/m                 Constitutional: Pt appears in NAD               HENT: Head: NCAT.                Right Ear: External ear normal.                 Left Ear: External ear normal.                Eyes: . Pupils are equal, round, and reactive to light. Conjunctivae and EOM are normal               Nose: without d/c or deformity               Neck: Neck supple. Gross normal ROM               Cardiovascular: Normal rate and regular rhythm.                 Pulmonary/Chest: Effort normal and breath sounds without rales or wheezing.                Abd:  Soft, NT, ND, + BS, no organomegaly               Neurological: Pt is alert. At baseline orientation, motor grossly intact               Skin: Skin is warm. No rashes, no other new lesions, LE edema - none               Psychiatric: Pt behavior is normal without agitation   Micro: none  Cardiac tracings I have personally interpreted today:  none  Pertinent Radiological findings (summarize): none   Lab Results  Component Value Date   WBC 7.8 05/17/2022   HGB 13.8 05/17/2022   HCT 40.0 05/17/2022   PLT 210.0 05/17/2022   GLUCOSE 87 05/17/2022   CHOL 202 (H) 05/17/2022   TRIG 61.0 05/17/2022   HDL 62.70 05/17/2022   LDLCALC 127 (H) 05/17/2022   ALT 15 05/17/2022   AST 23 05/17/2022   NA 135 05/17/2022   K 4.2 05/17/2022   CL 96 05/17/2022   CREATININE 1.01 05/17/2022   BUN 23 05/17/2022   CO2 31 05/17/2022   TSH 5.29 05/17/2022   HGBA1C 4.8 05/17/2022   Assessment/Plan:  KAMARA ALLAN is a 87 y.o. White or Caucasian [  1] female with  has a past medical history of Allergic rhinitis, Asthma, Breast cancer ([place]) (1987), Erysipelas, Hemorrhoids, Hypertension, and Hypothyroidism.  Encounter for well adult exam with abnormal findings Age and sex appropriate education  and counseling updated with regular exercise and diet Referrals for preventative services - none needed Immunizations addressed - declines covid booster, for shingrix at the pharmacy Smoking counseling  - none needed Evidence for depression or other mood disorder - none significant Most recent labs reviewed. I have personally reviewed and have noted: 1) the patient's medical and social history 2) The patient's current medications and supplements 3) The patient's height, weight, and BMI have been recorded in the chart   CKD (chronic kidney disease) stage 3, GFR 30-59 ml/min (HCC) Lab Results  Component Value Date   CREATININE 1.01 05/17/2022   Stable overall, cont to avoid nephrotoxins  Dizziness Recurring, c/w possible vertigo by hx, exam benign, for meclizine prn,  to f/u any worsening symptoms or concerns  Hypertension BP Readings from Last 3 Encounters:  05/17/22 122/62  11/11/21 (!) 144/86  06/16/21 (!) 159/83   Stable, pt to continue medical treatment hydralazine 50 tid, diovan hct 320-25 qd   Hypothyroidism Lab Results  Component Value Date   TSH 5.29 05/17/2022   Stable, pt to continue levothyroxine 100 mcg qd  Followup: Return in about 6 months (around 11/15/2022).  Cathlean Cower, MD 05/17/2022 7:20 PM [facility]

## 2022-05-17 NOTE — Assessment & Plan Note (Signed)
BP Readings from Last 3 Encounters:  05/17/22 122/62  11/11/21 (!) 144/86  06/16/21 (!) 159/83   Stable, pt to continue medical treatment hydralazine 50 tid, diovan hct 320-25 qd

## 2022-05-17 NOTE — Assessment & Plan Note (Signed)
Age and sex appropriate education and counseling updated with regular exercise and diet Referrals for preventative services - none needed Immunizations addressed - declines covid booster, for shingrix at the pharmacy Smoking counseling  - none needed Evidence for depression or other mood disorder - none significant Most recent labs reviewed. I have personally reviewed and have noted: 1) the patient's medical and social history 2) The patient's current medications and supplements 3) The patient's height, weight, and BMI have been recorded in the chart

## 2022-05-17 NOTE — Assessment & Plan Note (Signed)
Lab Results  Component Value Date   CREATININE 1.01 05/17/2022   Stable overall, cont to avoid nephrotoxins

## 2022-05-17 NOTE — Assessment & Plan Note (Signed)
Recurring, c/w possible vertigo by hx, exam benign, for meclizine prn,  to f/u any worsening symptoms or concerns

## 2022-05-17 NOTE — Assessment & Plan Note (Signed)
Lab Results  Component Value Date   TSH 5.29 05/17/2022   Stable, pt to continue levothyroxine 100 mcg qd

## 2022-05-17 NOTE — Patient Instructions (Addendum)
Please have your Shingrix (shingles) shots done at your local pharmacy.  Please take all new medication as prescribed - the meclizine as needed for the dizziness  Please continue all other medications as before, and refills have been done if requested.  Please have the pharmacy call with any other refills you may need.  Please continue your efforts at being more active, low cholesterol diet, and weight control.  You are otherwise up to date with prevention measures today.  Please keep your appointments with your specialists as you may have planned  Please go to the LAB at the blood drawing area for the tests to be done  You will be contacted by phone if any changes need to be made immediately.  Otherwise, you will receive a letter about your results with an explanation, but please check with MyChart first.  Please remember to sign up for MyChart if you have not done so, as this will be important to you in the future with finding out test results, communicating by private email, and scheduling acute appointments online when needed.  Please make an Appointment to return in 6 months, or sooner if needed

## 2022-05-23 DIAGNOSIS — R0989 Other specified symptoms and signs involving the circulatory and respiratory systems: Secondary | ICD-10-CM | POA: Insufficient documentation

## 2022-05-23 NOTE — Progress Notes (Unsigned)
Cardiology Office Note   Date:  05/26/2022   ID:  [name], Watt Jul 03, 1935, MRN [id number]  PCP:  Biagio Borg, MD  Cardiologist:   Minus Breeding, MD   Chief Complaint  Patient presents with   Dizziness     History of Present Illness: Julia Norton is a 87 y.o. female who is referred by Biagio Borg, MD for evaluation of dizziness.   After my first visit with her I sent her for a monitor.  She did not have any arrhythmias.  Echo was unremarkable.   She had dyspnea and so in 2021 I sent her for [facility] which was negative for ischemia.  She has had an extensive work-up of her dizziness.    She continues to have episodes of dizziness.  It is a fullness that goes from her head down.  It is unchanged.  We have evaluated this completely and in particular we checked an event monitor a couple of years ago.  She said her symptoms have not changed since then.  She is not having any syncope.  She is really not describing orthostatic symptoms.  She was recently started on meclizine and has not had significant improvement.  She is just started this however.  She is not describing any new palpitations.  She is not having any chest pressure, neck or arm discomfort.  She has had no weight gain or edema.   Past Medical History:  Diagnosis Date   Allergic rhinitis    Asthma    Breast cancer ([place]) 1987   Erysipelas    Hemorrhoids    Hypertension    Hypothyroidism     Past Surgical History:  Procedure Laterality Date   BREAST LUMPECTOMY Right 1986   malignant   TONSILLECTOMY       Current Outpatient Medications  Medication Sig Dispense Refill   aspirin 81 MG tablet Take 2 tablets by mouth once daily     augmented betamethasone dipropionate (DIPROLENE-AF) 0.05 % cream Apply topically. 1-2 times per day     Cholecalciferol (VITAMIN D3) 25 MCG (1000 UT) CAPS Take by mouth daily.     hydrALAZINE (APRESOLINE) 50 MG tablet Take 1 tablet (50 mg total) by mouth 2  (two) times daily. 180 tablet 3   levothyroxine (SYNTHROID) 100 MCG tablet Take 1 tablet (100 mcg total) by mouth daily. 90 tablet 1   Magnesium 400 MG CAPS Take by mouth.     meclizine (ANTIVERT) 12.5 MG tablet Take 1 tablet (12.5 mg total) by mouth 3 (three) times daily as needed for dizziness. 40 tablet 2   Multiple Vitamin (MULTIVITAMIN PO) Take 1 tablet by mouth daily.     Omega-3 Fatty Acids (FISH OIL) 1000 MG CAPS Take 1 capsule by mouth daily.     solifenacin (VESICARE) 10 MG tablet Take 1 tablet (10 mg total) by mouth daily. 90 tablet 1   valsartan-hydrochlorothiazide (DIOVAN-HCT) 320-25 MG tablet Take 1 tablet by mouth daily. 90 tablet 1   loratadine (CLARITIN) 10 MG tablet Take 10 mg by mouth daily. (Patient not taking: Reported on 05/25/2022)     No current facility-administered medications for this visit.    Allergies:   Penicillins    ROS:  Please see the history of present illness.   Otherwise, review of systems are positive for none.   All other systems are reviewed and negative.    PHYSICAL EXAM: VS:  BP (!) 144/76 (BP Location: Right Arm,  Patient Position: Sitting, Cuff Size: Normal)   Pulse 70   Ht '5\' 8"'$  (1.727 m)   Wt 172 lb (78 kg)   SpO2 98%   BMI 26.15 kg/m  , BMI Body mass index is 26.15 kg/m. GENERAL:  Well appearing NECK:  No jugular venous distention, waveform within normal limits, carotid upstroke brisk and symmetric, no bruits, no thyromegaly LUNGS:  Clear to auscultation bilaterally CHEST:  Unremarkable HEART:  PMI not displaced or sustained,S1 and S2 within normal limits, no S3, no S4, no clicks, no rubs, no murmurs ABD:  Flat, positive bowel sounds normal in frequency in pitch, no bruits, no rebound, no guarding, no midline pulsatile mass, no hepatomegaly, no splenomegaly EXT:  2 plus pulses throughout, no edema, no cyanosis no clubbing   EKG:  EKG is  ordered today. Sinus rhythm, rate 70, left axis deviation, interventricular conduction delay, no  change from previous, somewhat atypical right bundle branch block   Recent Labs: 05/17/2022: ALT 15; BUN 23; Creatinine, Ser 1.01; Hemoglobin 13.8; Platelets 210.0; Potassium 4.2; Sodium 135; TSH 5.29    Lipid Panel    Component Value Date/Time   CHOL 202 (H) 05/17/2022 1408   TRIG 61.0 05/17/2022 1408   HDL 62.70 05/17/2022 1408   CHOLHDL 3 05/17/2022 1408   VLDL 12.2 05/17/2022 1408   LDLCALC 127 (H) 05/17/2022 1408      Wt Readings from Last 3 Encounters:  05/25/22 172 lb (78 kg)  05/17/22 174 lb (78.9 kg)  11/11/21 175 lb 4 oz (79.5 kg)      Other studies Reviewed: Additional studies/ records that were reviewed today include: CT, MRI 2022 Review of the above records demonstrates:  Please see elsewhere in the note.     ASSESSMENT AND PLAN:  DIZZINESS:   I did go over her CT and MRI in depth with her.  She has a meningioma but it was ordered by neurology and thought to be benign without need for follow-up.  She can call her neurologist to further review if she has questions.  We talked at great length about her symptoms and that there would be no suggestion that this is an arrhythmia.  There was no arrhythmia noted previously.  She otherwise had a structurally normal heart.  No further cardiac workup.   BRUIT:   She had minimal coronary plaque previously.  No change in therapy.    Current medicines are reviewed at length with the patient today.  The patient does not have concerns regarding medicines.  The following changes have been made:  None  Labs/ tests ordered today include: None  Orders Placed This Encounter  Procedures   EKG 12-Lead    Disposition:   FU with me as needed.   Signed, Minus Breeding, MD  05/26/2022 7:51 AM    [facility]

## 2022-05-25 ENCOUNTER — Ambulatory Visit: Payer: Medicare Other | Attending: Cardiology | Admitting: Cardiology

## 2022-05-25 ENCOUNTER — Encounter: Payer: Self-pay | Admitting: Cardiology

## 2022-05-25 VITALS — BP 144/76 | HR 70 | Ht 68.0 in | Wt 172.0 lb

## 2022-05-25 DIAGNOSIS — R0989 Other specified symptoms and signs involving the circulatory and respiratory systems: Secondary | ICD-10-CM

## 2022-05-25 DIAGNOSIS — R42 Dizziness and giddiness: Secondary | ICD-10-CM | POA: Diagnosis not present

## 2022-05-25 NOTE — Patient Instructions (Signed)
Medication Instructions:  No changes    Lab Work: Not needed    Testing/Procedures:  Not needed  Follow-Up: At [facility], you and your health needs are our priority.  As part of our continuing mission to provide you with exceptional heart care, we have created designated Provider Care Teams.  These Care Teams include your primary Cardiologist (physician) and Advanced Practice Providers (APPs -  Physician Assistants and Nurse Practitioners) who all work together to provide you with the care you need, when you need it.     Your next appointment:   As needed     The format for your next appointment:   In Person  Provider:   Minus Breeding, MD

## 2022-05-26 ENCOUNTER — Encounter: Payer: Self-pay | Admitting: Cardiology

## 2022-06-20 ENCOUNTER — Other Ambulatory Visit: Payer: Self-pay | Admitting: *Deleted

## 2022-06-20 MED ORDER — LEVOTHYROXINE SODIUM 100 MCG PO TABS: 100.0000 ug | ORAL_TABLET | Freq: Every day | ORAL | 1 refills | Status: DC

## 2022-06-22 ENCOUNTER — Telehealth: Payer: Self-pay | Admitting: Internal Medicine

## 2022-06-22 NOTE — Telephone Encounter (Signed)
[facility] called to ask if they could make a change to the pt's levothyroxine (SYNTHROID) 100 MCG tablet.   Please call [facility] to discuss changes:  [phone] Opt. 2  Reference number: [id number]

## 2022-06-22 NOTE — Telephone Encounter (Signed)
Called [facility] need to verify script for Levothyroxine will received [facility] name.Marland KitchenJohny Chess

## 2022-07-05 ENCOUNTER — Other Ambulatory Visit: Payer: Self-pay | Admitting: Internal Medicine

## 2022-07-05 MED ORDER — LEVOTHYROXINE SODIUM 100 MCG PO TABS: 100.0000 ug | ORAL_TABLET | Freq: Every day | ORAL | 3 refills | Status: DC

## 2022-08-17 ENCOUNTER — Ambulatory Visit
Admission: RE | Admit: 2022-08-17 | Discharge: 2022-08-17 | Disposition: A | Discharge status: Home / Self Care | Payer: Medicare Other | Source: Ambulatory Visit | Attending: Internal Medicine | Admitting: Internal Medicine

## 2022-08-17 ENCOUNTER — Other Ambulatory Visit: Payer: Self-pay | Admitting: Internal Medicine

## 2022-08-17 DIAGNOSIS — R921 Mammographic calcification found on diagnostic imaging of breast: Secondary | ICD-10-CM

## 2022-08-22 ENCOUNTER — Telehealth: Payer: Self-pay | Admitting: Internal Medicine

## 2022-08-22 NOTE — Telephone Encounter (Signed)
Prescription Request  08/22/2022  LOV: 05/17/2022  What is the name of the medication or equipment? solifenacin (VESICARE) 10 MG tablet   Have you contacted your pharmacy to request a refill? No   Which pharmacy would you like this sent to?   [facility] - [place], [place] - [address] [facility] Portal to Reg[facility]  Patient notified that their request is being sent to the clinical staff for review and that they should receive a response within 2 business days.   Please advise at Mobile [phone] (mobile)

## 2022-08-23 ENCOUNTER — Other Ambulatory Visit: Payer: Self-pay

## 2022-08-23 NOTE — Telephone Encounter (Signed)
Refill sent.

## 2022-09-22 ENCOUNTER — Ambulatory Visit (INDEPENDENT_AMBULATORY_CARE_PROVIDER_SITE_OTHER): Payer: Medicare Other

## 2022-09-22 VITALS — Ht 68.0 in | Wt 165.0 lb

## 2022-09-22 DIAGNOSIS — Z0000 Encounter for general adult medical examination without abnormal findings: Secondary | ICD-10-CM

## 2022-09-22 NOTE — Patient Instructions (Signed)
Julia Norton , Thank you for taking time to come for your Medicare Wellness Visit. I appreciate your ongoing commitment to your health goals. Please review the following plan we discussed and let me know if I can assist you in the future.   These are the goals we discussed:  Goals      My goal is to get back in the gym.        This is a list of the screening recommended for you and due dates:  Health Maintenance  Topic Date Due   Zoster (Shingles) Vaccine (1 of 2) Never done   COVID-19 Vaccine (7 - 2023-24 season) 03/09/2022   Flu Shot  11/17/2022   Medicare Annual Wellness Visit  09/22/2023   DTaP/Tdap/Td vaccine (4 - Td or Tdap) 08/07/2029   Pneumonia Vaccine  Completed   DEXA scan (bone density measurement)  Completed   HPV Vaccine  Aged Out    Advanced directives: Yes  Conditions/risks identified: Yes; Unsteady Gait (Continue with cane)  Next appointment: Follow up in one year for your annual wellness visit.   Preventive Care 8 Years and Older, Female Preventive care refers to lifestyle choices and visits with your health care provider that can promote health and wellness. What does preventive care include? A yearly physical exam. This is also called an annual well check. Dental exams once or twice a year. Routine eye exams. Ask your health care provider how often you should have your eyes checked. Personal lifestyle choices, including: Daily care of your teeth and gums. Regular physical activity. Eating a healthy diet. Avoiding tobacco and drug use. Limiting alcohol use. Practicing safe sex. Taking low-dose aspirin every day. Taking vitamin and mineral supplements as recommended by your health care provider. What happens during an annual well check? The services and screenings done by your health care provider during your annual well check will depend on your age, overall health, lifestyle risk factors, and family history of disease. Counseling  Your health care  provider may ask you questions about your: Alcohol use. Tobacco use. Drug use. Emotional well-being. Home and relationship well-being. Sexual activity. Eating habits. History of falls. Memory and ability to understand (cognition). Work and work Astronomer. Reproductive health. Screening  You may have the following tests or measurements: Height, weight, and BMI. Blood pressure. Lipid and cholesterol levels. These may be checked every 5 years, or more frequently if you are over 27 years old. Skin check. Lung cancer screening. You may have this screening every year starting at age 79 if you have a 30-pack-year history of smoking and currently smoke or have quit within the past 15 years. Fecal occult blood test (FOBT) of the stool. You may have this test every year starting at age 5. Flexible sigmoidoscopy or colonoscopy. You may have a sigmoidoscopy every 5 years or a colonoscopy every 10 years starting at age 71. Hepatitis C blood test. Hepatitis B blood test. Sexually transmitted disease (STD) testing. Diabetes screening. This is done by checking your blood sugar (glucose) after you have not eaten for a while (fasting). You may have this done every 1-3 years. Bone density scan. This is done to screen for osteoporosis. You may have this done starting at age 33. Mammogram. This may be done every 1-2 years. Talk to your health care provider about how often you should have regular mammograms. Talk with your health care provider about your test results, treatment options, and if necessary, the need for more tests. Vaccines  Your  health care provider may recommend certain vaccines, such as: Influenza vaccine. This is recommended every year. Tetanus, diphtheria, and acellular pertussis (Tdap, Td) vaccine. You may need a Td booster every 10 years. Zoster vaccine. You may need this after age 18. Pneumococcal 13-valent conjugate (PCV13) vaccine. One dose is recommended after age  58. Pneumococcal polysaccharide (PPSV23) vaccine. One dose is recommended after age 73. Talk to your health care provider about which screenings and vaccines you need and how often you need them. This information is not intended to replace advice given to you by your health care provider. Make sure you discuss any questions you have with your health care provider. Document Released: 05/01/2015 Document Revised: 12/23/2015 Document Reviewed: 02/03/2015 Elsevier Interactive Patient Education  2017 [employer].  Fall Prevention in the Home Falls can cause injuries. They can happen to people of all ages. There are many things you can do to make your home safe and to help prevent falls. What can I do on the outside of my home? Regularly fix the edges of walkways and driveways and fix any cracks. Remove anything that might make you trip as you walk through a door, such as a raised step or threshold. Trim any bushes or trees on the path to your home. Use bright outdoor lighting. Clear any walking paths of anything that might make someone trip, such as rocks or tools. Regularly check to see if handrails are loose or broken. Make sure that both sides of any steps have handrails. Any raised decks and porches should have guardrails on the edges. Have any leaves, snow, or ice cleared regularly. Use sand or salt on walking paths during winter. Clean up any spills in your garage right away. This includes oil or grease spills. What can I do in the bathroom? Use night lights. Install grab bars by the toilet and in the tub and shower. Do not use towel bars as grab bars. Use non-skid mats or decals in the tub or shower. If you need to sit down in the shower, use a plastic, non-slip stool. Keep the floor dry. Clean up any water that spills on the floor as soon as it happens. Remove soap buildup in the tub or shower regularly. Attach bath mats securely with double-sided non-slip rug tape. Do not have throw  rugs and other things on the floor that can make you trip. What can I do in the bedroom? Use night lights. Make sure that you have a light by your bed that is easy to reach. Do not use any sheets or blankets that are too big for your bed. They should not hang down onto the floor. Have a firm chair that has side arms. You can use this for support while you get dressed. Do not have throw rugs and other things on the floor that can make you trip. What can I do in the kitchen? Clean up any spills right away. Avoid walking on wet floors. Keep items that you use a lot in easy-to-reach places. If you need to reach something above you, use a strong step stool that has a grab bar. Keep electrical cords out of the way. Do not use floor polish or wax that makes floors slippery. If you must use wax, use non-skid floor wax. Do not have throw rugs and other things on the floor that can make you trip. What can I do with my stairs? Do not leave any items on the stairs. Make sure that there are handrails  on both sides of the stairs and use them. Fix handrails that are broken or loose. Make sure that handrails are as long as the stairways. Check any carpeting to make sure that it is firmly attached to the stairs. Fix any carpet that is loose or worn. Avoid having throw rugs at the top or bottom of the stairs. If you do have throw rugs, attach them to the floor with carpet tape. Make sure that you have a light switch at the top of the stairs and the bottom of the stairs. If you do not have them, ask someone to add them for you. What else can I do to help prevent falls? Wear shoes that: Do not have high heels. Have rubber bottoms. Are comfortable and fit you well. Are closed at the toe. Do not wear sandals. If you use a stepladder: Make sure that it is fully opened. Do not climb a closed stepladder. Make sure that both sides of the stepladder are locked into place. Ask someone to hold it for you, if  possible. Clearly mark and make sure that you can see: Any grab bars or handrails. First and last steps. Where the edge of each step is. Use tools that help you move around (mobility aids) if they are needed. These include: Canes. Walkers. Scooters. Crutches. Turn on the lights when you go into a dark area. Replace any light bulbs as soon as they burn out. Set up your furniture so you have a clear path. Avoid moving your furniture around. If any of your floors are uneven, fix them. If there are any pets around you, be aware of where they are. Review your medicines with your doctor. Some medicines can make you feel dizzy. This can increase your chance of falling. Ask your doctor what other things that you can do to help prevent falls. This information is not intended to replace advice given to you by your health care provider. Make sure you discuss any questions you have with your health care provider. Document Released: 01/29/2009 Document Revised: 09/10/2015 Document Reviewed: 05/09/2014 Elsevier Interactive Patient Education  2017 [employer].

## 2022-09-22 NOTE — Progress Notes (Addendum)
I connected with  [name] on 09/22/22 by a audio enabled telemedicine application and verified that I am speaking with the correct person using two identifiers.  Patient Location: Home  Provider Location: Office/Clinic  I discussed the limitations of evaluation and management by telemedicine. The patient expressed understanding and agreed to proceed.  Subjective:   Julia Norton is a 87 y.o. female who presents for Medicare Annual (Subsequent) preventive examination.  Review of Systems     Cardiac Risk Factors include: advanced age (>36men, >46 women);hypertension;family history of premature cardiovascular disease;sedentary lifestyle     Objective:    Today's Vitals   09/22/22 1304  Weight: 165 lb (74.8 kg)  Height: 5\' 8"  (1.727 m)  PainSc: 0-No pain   Body mass index is 25.09 kg/m.     09/22/2022    1:07 PM 08/10/2021    1:39 PM 03/26/2021   11:56 AM 05/18/2020    2:54 PM  Advanced Directives  Does Patient Have a Medical Advance Directive? Yes Yes Yes Yes  Type of Estate agent of [place];Living will Living will;Healthcare Power of [employer] Power of [place];Living will Healthcare Power of [place];Living will  Does patient want to make changes to medical advance directive?  No - Patient declined    Copy of Healthcare Power of Attorney in Chart? No - copy requested No - copy requested      Current Medications (verified) Outpatient Encounter Medications as of 09/22/2022  Medication Sig   aspirin 81 MG tablet Take 2 tablets by mouth once daily   augmented betamethasone dipropionate (DIPROLENE-AF) 0.05 % cream Apply topically. 1-2 times per day   Cholecalciferol (VITAMIN D3) 25 MCG (1000 UT) CAPS Take by mouth daily.   hydrALAZINE (APRESOLINE) 50 MG tablet Take 1 tablet (50 mg total) by mouth 2 (two) times daily.   levothyroxine (SYNTHROID) 100 MCG tablet Take 1 tablet (100 mcg total) by mouth daily.   loratadine (CLARITIN) 10  MG tablet Take 10 mg by mouth daily. (Patient not taking: Reported on 05/25/2022)   Magnesium 400 MG CAPS Take by mouth.   meclizine (ANTIVERT) 12.5 MG tablet Take 1 tablet (12.5 mg total) by mouth 3 (three) times daily as needed for dizziness.   Multiple Vitamin (MULTIVITAMIN PO) Take 1 tablet by mouth daily.   Omega-3 Fatty Acids (FISH OIL) 1000 MG CAPS Take 1 capsule by mouth daily.   solifenacin (VESICARE) 10 MG tablet TAKE 1 TABLET DAILY   valsartan-hydrochlorothiazide (DIOVAN-HCT) 320-25 MG tablet Take 1 tablet by mouth daily.   No facility-administered encounter medications on file as of 09/22/2022.    Allergies (verified) Penicillins   History: Past Medical History:  Diagnosis Date   Allergic rhinitis    Asthma    Breast cancer (HCC) 1987   Erysipelas    Hemorrhoids    Hypertension    Hypothyroidism    Past Surgical History:  Procedure Laterality Date   BREAST LUMPECTOMY Right 1986   malignant   TONSILLECTOMY     Family History  Problem Relation Age of Onset   Celiac disease Sister    Heart disease Sister 82       heart attack, died 17   Arthritis Sister    COPD Sister    Hypertension Sister    Heart disease Mother        CHF   Arthritis Mother    Hypertension Mother    Heart disease Father 43       heart attack  Arthritis Father    Social History   Socioeconomic History   Marital status: Married    Spouse name: Not on file   Number of children: 3   Years of education: Not on file   Highest education level: High school graduate  Occupational History   Not on file  Tobacco Use   Smoking status: Former    Types: Cigarettes    Quit date: 04/18/1961    Years since quitting: 61.4   Smokeless tobacco: Never  Vaping Use   Vaping Use: Never used  Substance and Sexual Activity   Alcohol use: Never   Drug use: Never   Sexual activity: Not Currently  Other Topics Concern   Not on file  Social History Narrative   Lives with husband.  She has three  children and 7 grands and one great.       Caffeine: very little (chocolate), drinks decaf coffee   Social Determinants of Health   Financial Resource Strain: Low Risk  (08/10/2021)   Overall Financial Resource Strain (CARDIA)    Difficulty of Paying Living Expenses: Not hard at all  Food Insecurity: No Food Insecurity (08/10/2021)   Hunger Vital Sign    Worried About Running Out of Food in the Last Year: Never true    Ran Out of Food in the Last Year: Never true  Transportation Needs: No Transportation Needs (08/10/2021)   PRAPARE - Administrator, Civil Service (Medical): No    Lack of Transportation (Non-Medical): No  Physical Activity: Sufficiently Active (08/10/2021)   Exercise Vital Sign    Days of Exercise per Week: 5 days    Minutes of Exercise per Session: 30 min  Stress: No Stress Concern Present (08/10/2021)   [employer] of Occupational Health - Occupational Stress Questionnaire    Feeling of Stress : Not at all  Social Connections: Moderately Isolated (08/10/2021)   Social Connection and Isolation Panel [NHANES]    Frequency of Communication with Friends and Family: More than three times a week    Frequency of Social Gatherings with Friends and Family: More than three times a week    Attends Religious Services: Never    Database administrator or Organizations: No    Attends Engineer, structural: Never    Marital Status: Married    Tobacco Counseling Counseling given: Not Answered   Clinical Intake:  Pre-visit preparation completed: Yes  Pain : No/denies pain Pain Score: 0-No pain     BMI - recorded: 25.09 Nutritional Status: BMI 25 -29 Overweight Nutritional Risks: None Diabetes: No  How often do you need to have someone help you when you read instructions, pamphlets, or other written materials from your doctor or pharmacy?: 1 - Never What is the last grade level you completed in school?: HSG; some college  Diabetic?  No  Interpreter Needed?: No  Information entered by :: Jakia Kennebrew N. Tvisha Schwoerer, LPN.   Activities of Daily Living    09/22/2022    1:10 PM  In your present state of health, do you have any difficulty performing the following activities:  Hearing? 0  Vision? 0  Difficulty concentrating or making decisions? 0  Walking or climbing stairs? 0  Dressing or bathing? 0  Doing errands, shopping? 0  Preparing Food and eating ? N  Using the Toilet? N  In the past six months, have you accidently leaked urine? Y  Comment OAB  Do you have problems with loss of bowel control? N  Managing your Medications? N  Managing your Finances? N  Housekeeping or managing your Housekeeping? N    Patient Care Team: Corwin Levins, MD as PCP - General (Internal Medicine) Rollene Rotunda, MD as PCP - Cardiology (Cardiology) Maris Berger, MD as Consulting Physician (Ophthalmology)  Indicate any recent Medical Services you may have received from other than [facility] providers in the past year (date may be approximate).     Assessment:   This is a routine wellness examination for [place].  Hearing/Vision screen Hearing Screening - Comments:: Denies hearing difficulties   Vision Screening - Comments:: Wears rx glasses - up to date with routine eye exams with Maris Berger, MD.   Dietary issues and exercise activities discussed: Current Exercise Habits: The patient does not participate in regular exercise at present, Exercise limited by: respiratory conditions(s)   Goals Addressed   None   Depression Screen    09/22/2022    1:15 PM 05/17/2022    1:34 PM 05/17/2022    1:10 PM 11/11/2021    1:11 PM 08/10/2021    1:29 PM 09/25/2020   11:33 AM 09/25/2020   11:16 AM  PHQ 2/9 Scores  PHQ - 2 Score 0 0 0 0 0 0 0  PHQ- 9 Score 1  0        Fall Risk    09/22/2022    1:10 PM 05/17/2022    1:34 PM 05/17/2022    1:10 PM 11/11/2021    1:38 PM 11/11/2021    1:12 PM  Fall Risk   Falls in the past year? 0 0 0  0 0  Number falls in past yr: 0 0 0 0 0  Injury with Fall? 0 0 0 0 0  Risk for fall due to : No Fall Risks  No Fall Risks  No Fall Risks  Follow up Falls prevention discussed  Falls evaluation completed  Falls evaluation completed    FALL RISK PREVENTION PERTAINING TO THE HOME:  Any stairs in or around the home? Yes  If so, are there any without handrails? No  Home free of loose throw rugs in walkways, pet beds, electrical cords, etc? Yes  Adequate lighting in your home to reduce risk of falls? Yes   ASSISTIVE DEVICES UTILIZED TO PREVENT FALLS:  Life alert? No  Use of a cane, walker or w/c? Yes; Cane Grab bars in the bathroom? Yes  Shower chair or bench in shower? Yes  Elevated toilet seat or a handicapped toilet? Yes   TIMED UP AND GO:  Was the test performed? No . Telephonic Visit  Cognitive Function:        09/22/2022    1:10 PM 08/10/2021    1:49 PM  6CIT Screen  What Year? 0 points 0 points  What month? 0 points 0 points  What time? 0 points 0 points  Count back from 20 0 points 0 points  Months in reverse 0 points 0 points  Repeat phrase 0 points 0 points  Total Score 0 points 0 points    Immunizations Immunization History  Administered Date(s) Administered   DTaP 04/18/2006   Fluad Quad(high Dose 65+) 01/20/2020, 01/01/2021, 01/12/2022   Influenza Split 01/17/2019   Influenza, High Dose Seasonal PF 01/15/2015, 01/29/2016   Influenza, Quadrivalent, Recombinant, Inj, Pf 01/30/2017, 01/26/2018, 12/14/2018   Influenza-Unspecified 01/21/2011, 01/16/2012, 01/03/2014   PFIZER Comirnaty(Gray Top)Covid-19 Tri-Sucrose Vaccine 11/03/2020   PFIZER(Purple Top)SARS-COV-2 Vaccination 06/01/2019, 06/26/2019, 02/04/2020   Pfizer Covid-19 Vaccine Bivalent Booster 20yrs & up  03/25/2021   Pneumococcal Conjugate-13 08/08/2019   Pneumococcal Polysaccharide-23 04/18/2006, 08/01/2006   Td 06/06/2006   Tdap 08/08/2019   Unspecified SARS-COV-2 Vaccination 01/12/2022    TDAP  status: Up to date  Flu Vaccine status: Up to date  Pneumococcal vaccine status: Up to date  Covid-19 vaccine status: Completed vaccines  Qualifies for Shingles Vaccine? Yes   Zostavax completed No   Shingrix Completed?: No.    Education has been provided regarding the importance of this vaccine. Patient has been advised to call insurance company to determine out of pocket expense if they have not yet received this vaccine. Advised may also receive vaccine at local pharmacy or Health Dept. Verbalized acceptance and understanding.  Screening Tests Health Maintenance  Topic Date Due   Zoster Vaccines- Shingrix (1 of 2) Never done   COVID-19 Vaccine (7 - 2023-24 season) 03/09/2022   INFLUENZA VACCINE  11/17/2022   Medicare Annual Wellness (AWV)  09/22/2023   DTaP/Tdap/Td (4 - Td or Tdap) 08/07/2029   Pneumonia Vaccine 9+ Years old  Completed   DEXA SCAN  Completed   HPV VACCINES  Aged Out    Health Maintenance  Health Maintenance Due  Topic Date Due   Zoster Vaccines- Shingrix (1 of 2) Never done   COVID-19 Vaccine (7 - 2023-24 season) 03/09/2022    Colorectal cancer screening: No longer required.   Mammogram status: Completed 02/09/2022. Repeat every year  Bone Density status: Completed 08/20/2019. Results reflect: Bone density results: OSTEOPENIA. Repeat every 2-3 years.  Lung Cancer Screening: (Low Dose CT Chest recommended if Age 65-80 years, 30 pack-year currently smoking OR have quit w/in 15years.) does not qualify.   Lung Cancer Screening Referral: no  Additional Screening:  Hepatitis C Screening: does not qualify; Completed: no  Vision Screening: Recommended annual ophthalmology exams for early detection of glaucoma and other disorders of the eye. Is the patient up to date with their annual eye exam?  Yes  Who is the provider or what is the name of the office in which the patient attends annual eye exams? Maris Berger, MD. If pt is not established with a  provider, would they like to be referred to a provider to establish care? No .   Dental Screening: Recommended annual dental exams for proper oral hygiene  Community Resource Referral / Chronic Care Management: CRR required this visit?  No   CCM required this visit?  No      Plan:     I have personally reviewed and noted the following in the patient's chart:   Medical and social history Use of alcohol, tobacco or illicit drugs  Current medications and supplements including opioid prescriptions. Patient is not currently taking opioid prescriptions. Functional ability and status Nutritional status Physical activity Advanced directives List of other physicians Hospitalizations, surgeries, and ER visits in previous 12 months Vitals Screenings to include cognitive, depression, and falls Referrals and appointments  In addition, I have reviewed and discussed with patient certain preventive protocols, quality metrics, and best practice recommendations. A written personalized care plan for preventive services as well as general preventive health recommendations were provided to patient.     Mickeal Needy, LPN   0/12/8117   Nurse Notes: Normal cognitive status assessed by direct observation via telephone conversation by this Nurse Health Advisor. No abnormalities found.   Medical screening examination/treatment/procedure(s) were performed by non-physician practitioner and as supervising physician I was immediately available for consultation/collaboration.  I agree with above. Jacinta Shoe, MD

## 2022-10-17 ENCOUNTER — Other Ambulatory Visit: Payer: Self-pay | Admitting: Internal Medicine

## 2023-01-13 ENCOUNTER — Other Ambulatory Visit: Payer: Self-pay | Admitting: Internal Medicine

## 2023-01-17 ENCOUNTER — Ambulatory Visit (INDEPENDENT_AMBULATORY_CARE_PROVIDER_SITE_OTHER): Payer: Medicare Other | Admitting: Internal Medicine

## 2023-01-17 ENCOUNTER — Encounter: Payer: Self-pay | Admitting: Internal Medicine

## 2023-01-17 VITALS — BP 120/72 | HR 65 | Temp 99.0°F | Ht 68.0 in | Wt 174.0 lb

## 2023-01-17 DIAGNOSIS — N1831 Chronic kidney disease, stage 3a: Secondary | ICD-10-CM

## 2023-01-17 DIAGNOSIS — I10 Essential (primary) hypertension: Secondary | ICD-10-CM

## 2023-01-17 DIAGNOSIS — R269 Unspecified abnormalities of gait and mobility: Secondary | ICD-10-CM | POA: Diagnosis not present

## 2023-01-17 DIAGNOSIS — E7800 Pure hypercholesterolemia, unspecified: Secondary | ICD-10-CM | POA: Diagnosis not present

## 2023-01-17 DIAGNOSIS — R42 Dizziness and giddiness: Secondary | ICD-10-CM

## 2023-01-17 DIAGNOSIS — E039 Hypothyroidism, unspecified: Secondary | ICD-10-CM

## 2023-01-17 LAB — CBC WITH DIFFERENTIAL/PLATELET
Basophils Absolute: 0 10*3/uL (ref 0.0–0.1)
Basophils Relative: 0.4 % (ref 0.0–3.0)
Eosinophils Absolute: 0.4 10*3/uL (ref 0.0–0.7)
Eosinophils Relative: 4.5 % (ref 0.0–5.0)
HCT: 43.4 % (ref 36.0–46.0)
Hemoglobin: 14.3 g/dL (ref 12.0–15.0)
Lymphocytes Relative: 18.1 % (ref 12.0–46.0)
Lymphs Abs: 1.5 10*3/uL (ref 0.7–4.0)
MCHC: 33 g/dL (ref 30.0–36.0)
MCV: 87.6 fL (ref 78.0–100.0)
Monocytes Absolute: 0.6 10*3/uL (ref 0.1–1.0)
Monocytes Relative: 7.1 % (ref 3.0–12.0)
Neutro Abs: 6 10*3/uL (ref 1.4–7.7)
Neutrophils Relative %: 69.9 % (ref 43.0–77.0)
Platelets: 204 10*3/uL (ref 150.0–400.0)
RBC: 4.95 Mil/uL (ref 3.87–5.11)
RDW: 14.2 % (ref 11.5–15.5)
WBC: 8.5 10*3/uL (ref 4.0–10.5)

## 2023-01-17 LAB — HEPATIC FUNCTION PANEL
ALT: 15 U/L (ref 0–35)
AST: 21 U/L (ref 0–37)
Albumin: 4.4 g/dL (ref 3.5–5.2)
Alkaline Phosphatase: 63 U/L (ref 39–117)
Bilirubin, Direct: 0.2 mg/dL (ref 0.0–0.3)
Total Bilirubin: 0.9 mg/dL (ref 0.2–1.2)
Total Protein: 6.9 g/dL (ref 6.0–8.3)

## 2023-01-17 LAB — BASIC METABOLIC PANEL
BUN: 23 mg/dL (ref 6–23)
CO2: 32 meq/L (ref 19–32)
Calcium: 9.7 mg/dL (ref 8.4–10.5)
Chloride: 99 meq/L (ref 96–112)
Creatinine, Ser: 1.1 mg/dL (ref 0.40–1.20)
GFR: 45.79 mL/min — ABNORMAL LOW (ref >60.00)
Glucose, Bld: 95 mg/dL (ref 70–99)
Potassium: 4.3 meq/L (ref 3.5–5.1)
Sodium: 138 meq/L (ref 135–145)

## 2023-01-17 LAB — TSH: TSH: 3.02 u[IU]/mL (ref 0.35–5.50)

## 2023-01-17 NOTE — Progress Notes (Signed)
Patient ID: Julia Norton, female   DOB: 1936/02/03, 87 y.o.   MRN: [id number]         Chief Complaint: follow up chronic dizziness, ckd3a, gait d/o, hld, low thyroid       HPI:  Julia Norton is a 87 y.o. 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  Denies hyper or hypo thyroid symptoms such as voice, skin or hair change.  .Does not want statin tx or other for her hld, still workingon lower chol diet.  Wt has been up and down without swelling.         Wt Readings from Last 3 Encounters:  01/17/23 174 lb (78.9 kg)  09/22/22 165 lb (74.8 kg)  05/25/22 172 lb (78 kg)   BP Readings from Last 3 Encounters:  01/17/23 120/72  05/25/22 (!) 144/76  05/17/22 122/62         Past Medical History:  Diagnosis Date   Allergic rhinitis    Asthma    Breast cancer (HCC) 1987   Erysipelas    Hemorrhoids    Hypertension    Hypothyroidism    Past Surgical History:  Procedure Laterality Date   BREAST LUMPECTOMY Right 1986   malignant   TONSILLECTOMY      reports that she quit smoking about 61 years ago. Her smoking use included cigarettes. She has never used smokeless tobacco. She reports that she does not drink alcohol and does not use drugs. family history includes Arthritis in her father, mother, and sister; COPD in her sister; Celiac disease in her sister; Heart disease in her mother; Heart disease (age of onset: 45) in her father; Heart disease (age of onset: 80) in her sister; Hypertension in her mother and sister. Allergies  Allergen Reactions   Penicillins     Does not remember the reaction to medication.   Current Outpatient Medications on File Prior to Visit  Medication Sig Dispense Refill   aspirin 81 MG tablet Take 2 tablets by mouth once daily     augmented betamethasone dipropionate (DIPROLENE-AF) 0.05 % cream Apply  topically. 1-2 times per day     Cholecalciferol (VITAMIN D3) 25 MCG (1000 UT) CAPS Take by mouth daily.     hydrALAZINE (APRESOLINE) 50 MG tablet Take 1 tablet (50 mg total) by mouth 2 (two) times daily. 180 tablet 3   levothyroxine (SYNTHROID) 100 MCG tablet Take 1 tablet (100 mcg total) by mouth daily. 90 tablet 3   Magnesium 400 MG CAPS Take by mouth.     meclizine (ANTIVERT) 12.5 MG tablet Take 1 tablet (12.5 mg total) by mouth 3 (three) times daily as needed for dizziness. 40 tablet 2   Multiple Vitamin (MULTIVITAMIN PO) Take 1 tablet by mouth daily.     Omega-3 Fatty Acids (FISH OIL) 1000 MG CAPS Take 1 capsule by mouth daily.     valsartan-hydrochlorothiazide (DIOVAN-HCT) 320-25 MG tablet TAKE 1 TABLET DAILY 90 tablet 1   loratadine (CLARITIN) 10 MG tablet Take 10 mg by mouth daily. (Patient not taking: Reported on 05/25/2022)     No current facility-administered medications on file prior to visit.        ROS:  All others reviewed and negative.  Objective        PE:  BP 120/72 (BP Location: Right Arm, Patient Position: Sitting, Cuff  Size: Normal)   Pulse 65   Temp 99 F (37.2 C) (Oral)   Ht 5\' 8"  (1.727 m)   Wt 174 lb (78.9 kg)   SpO2 97%   BMI 26.46 kg/m                 Constitutional: Pt appears in NAD               HENT: Head: NCAT.                Right Ear: External ear normal.                 Left Ear: External ear normal.                Eyes: . Pupils are equal, round, and reactive to light. Conjunctivae and EOM are normal               Nose: without d/c or deformity               Neck: Neck supple. Gross normal ROM               Cardiovascular: Normal rate and regular rhythm.                 Pulmonary/Chest: Effort normal and breath sounds without rales or wheezing.                Abd:  Soft, NT, ND, + BS, no organomegaly               Neurological: Pt is alert. At baseline orientation, motor grossly intact               Skin: Skin is warm. No rashes, no other new  lesions, LE edema - none               Psychiatric: Pt behavior is normal without agitation   Micro: none  Cardiac tracings I have personally interpreted today:  none  Pertinent Radiological findings (summarize): none   Lab Results  Component Value Date   WBC 8.5 01/17/2023   HGB 14.3 01/17/2023   HCT 43.4 01/17/2023   PLT 204.0 01/17/2023   GLUCOSE 95 01/17/2023   CHOL 202 (H) 05/17/2022   TRIG 61.0 05/17/2022   HDL 62.70 05/17/2022   LDLCALC 127 (H) 05/17/2022   ALT 15 01/17/2023   AST 21 01/17/2023   NA 138 01/17/2023   K 4.3 01/17/2023   CL 99 01/17/2023   CREATININE 1.10 01/17/2023   BUN 23 01/17/2023   CO2 32 01/17/2023   TSH 3.02 01/17/2023   HGBA1C 4.8 05/17/2022   Assessment/Plan:  Julia Norton is a 87 y.o. White or Caucasian [1] female with  has a past medical history of Allergic rhinitis, Asthma, Breast cancer (HCC) (1987), Erysipelas, Hemorrhoids, Hypertension, and Hypothyroidism.  CKD (chronic kidney disease) stage 3, GFR 30-59 ml/min (HCC) Lab Results  Component Value Date   CREATININE 1.10 01/17/2023   Stable overall, cont to avoid nephrotoxins   Dizziness Chronic stable for 3 yrs, no etiology found after multiple consults,  to f/u any worsening symptoms or concerns   Hypertension BP Readings from Last 3 Encounters:  01/17/23 120/72  05/25/22 (!) 144/76  05/17/22 122/62   Stable, pt to continue medical treatment hydralazine 50 bid, diovan hct 320 25 qd   Hypothyroidism Lab Results  Component Value Date   TSH 3.02 01/17/2023   Stable, pt to continue levothyroxine 100 mcg  qd  Gait disorder Ok for letter to city for garbage and post office for mail service, declnes need for PT  Pure hypercholesterolemia Lab Results  Component Value Date   LDLCALC 127 (H) 05/17/2022   Uncontrolled, for lower chol diet, declines statin or other for now  Followup: Return in about 6 months (around 07/18/2023).  Oliver Barre, MD 01/20/2023 8:58  PM [facility]

## 2023-01-17 NOTE — Patient Instructions (Signed)
Please continue all other medications as before, and refills have been done if requested.  Please have the pharmacy call with any other refills you may need.  Please continue your efforts at being more active, low cholesterol diet, and weight control..  Please keep your appointments with your specialists as you may have planned  We will forward the letters when availabie  Please make an Appointment to return in 6 months, or sooner if needed

## 2023-01-17 NOTE — Progress Notes (Signed)
The test results show that your current treatment is OK, as the tests are stable.  Please continue the same plan.  There is no other need for change of treatment or further evaluation based on these results, at this time.  thanks 

## 2023-01-18 ENCOUNTER — Other Ambulatory Visit: Payer: Self-pay | Admitting: Internal Medicine

## 2023-01-19 ENCOUNTER — Other Ambulatory Visit: Payer: Self-pay

## 2023-01-20 ENCOUNTER — Encounter: Payer: Self-pay | Admitting: Internal Medicine

## 2023-01-20 DIAGNOSIS — R269 Unspecified abnormalities of gait and mobility: Secondary | ICD-10-CM | POA: Insufficient documentation

## 2023-01-20 NOTE — Assessment & Plan Note (Signed)
Lab Results  Component Value Date   CREATININE 1.10 01/17/2023   Stable overall, cont to avoid nephrotoxins

## 2023-01-20 NOTE — Assessment & Plan Note (Signed)
Lab Results  Component Value Date   LDLCALC 127 (H) 05/17/2022   Uncontrolled, for lower chol diet, declines statin or other for now

## 2023-01-20 NOTE — Assessment & Plan Note (Signed)
Ok for letter to city for garbage and post office for [employer], declnes need for PT

## 2023-01-20 NOTE — Assessment & Plan Note (Signed)
Chronic stable for 3 yrs, no etiology found after multiple consults,  to f/u any worsening symptoms or concerns

## 2023-01-20 NOTE — Assessment & Plan Note (Signed)
Lab Results  Component Value Date   TSH 3.02 01/17/2023   Stable, pt to continue levothyroxine 100 mcg qd

## 2023-01-20 NOTE — Assessment & Plan Note (Signed)
BP Readings from Last 3 Encounters:  01/17/23 120/72  05/25/22 (!) 144/76  05/17/22 122/62   Stable, pt to continue medical treatment hydralazine 50 bid, diovan hct 320 25 qd

## 2023-01-23 ENCOUNTER — Encounter: Payer: Self-pay | Admitting: Internal Medicine

## 2023-02-09 ENCOUNTER — Other Ambulatory Visit: Payer: Self-pay | Admitting: Internal Medicine

## 2023-02-09 DIAGNOSIS — R928 Other abnormal and inconclusive findings on diagnostic imaging of breast: Secondary | ICD-10-CM

## 2023-03-01 ENCOUNTER — Ambulatory Visit: Payer: Medicare Other | Admitting: Internal Medicine

## 2023-03-01 ENCOUNTER — Other Ambulatory Visit: Payer: Self-pay | Admitting: Cardiology

## 2023-03-08 ENCOUNTER — Other Ambulatory Visit: Payer: Self-pay | Admitting: Internal Medicine

## 2023-03-08 ENCOUNTER — Ambulatory Visit
Admission: RE | Admit: 2023-03-08 | Discharge: 2023-03-08 | Disposition: A | Discharge status: Home / Self Care | Payer: Medicare Other | Source: Ambulatory Visit | Attending: Internal Medicine | Admitting: Internal Medicine

## 2023-03-08 DIAGNOSIS — R928 Other abnormal and inconclusive findings on diagnostic imaging of breast: Secondary | ICD-10-CM

## 2023-03-08 DIAGNOSIS — R921 Mammographic calcification found on diagnostic imaging of breast: Secondary | ICD-10-CM

## 2023-04-04 ENCOUNTER — Telehealth: Payer: Self-pay | Admitting: Internal Medicine

## 2023-04-04 ENCOUNTER — Other Ambulatory Visit: Payer: Self-pay

## 2023-04-04 MED ORDER — VALSARTAN-HYDROCHLOROTHIAZIDE 320-25 MG PO TABS: 1.0000 | ORAL_TABLET | Freq: Every day | ORAL | 1 refills | Status: DC

## 2023-04-04 NOTE — Telephone Encounter (Signed)
Error

## 2023-04-04 NOTE — Telephone Encounter (Signed)
Prescription Request  04/04/2023  LOV: 01/17/2023  What is the name of the medication or equipment? valsartan-hydrochlorothiazide (DIOVAN-HCT) 320-25 MG tablet   Have you contacted your pharmacy to request a refill? No   Which pharmacy would you like thi [facility] [place], [place] - [facility] AT Portal to [facility] [address] [place] [address] Phone: [phone] Fax: [phone]   Patient notified that their request is being sent to the clinical staff for review and that they should receive a response within 2 business days.   Please advise at Mobile [phone] (mobile)

## 2023-04-04 NOTE — Telephone Encounter (Signed)
Refill has been sent.  °

## 2023-04-05 ENCOUNTER — Other Ambulatory Visit: Payer: Self-pay | Admitting: Internal Medicine

## 2023-06-01 ENCOUNTER — Other Ambulatory Visit: Payer: Self-pay | Admitting: Cardiology

## 2023-07-21 ENCOUNTER — Encounter: Payer: Self-pay | Admitting: Internal Medicine

## 2023-07-21 ENCOUNTER — Ambulatory Visit: Payer: Medicare Other | Admitting: Internal Medicine

## 2023-07-21 VITALS — BP 128/72 | HR 67 | Temp 98.5°F | Ht 68.0 in | Wt 173.0 lb

## 2023-07-21 DIAGNOSIS — Z0001 Encounter for general adult medical examination with abnormal findings: Secondary | ICD-10-CM

## 2023-07-21 DIAGNOSIS — N1831 Chronic kidney disease, stage 3a: Secondary | ICD-10-CM

## 2023-07-21 DIAGNOSIS — I10 Essential (primary) hypertension: Secondary | ICD-10-CM

## 2023-07-21 DIAGNOSIS — E7800 Pure hypercholesterolemia, unspecified: Secondary | ICD-10-CM | POA: Diagnosis not present

## 2023-07-21 DIAGNOSIS — E559 Vitamin D deficiency, unspecified: Secondary | ICD-10-CM | POA: Diagnosis not present

## 2023-07-21 DIAGNOSIS — R739 Hyperglycemia, unspecified: Secondary | ICD-10-CM | POA: Diagnosis not present

## 2023-07-21 DIAGNOSIS — J309 Allergic rhinitis, unspecified: Secondary | ICD-10-CM | POA: Diagnosis not present

## 2023-07-21 DIAGNOSIS — N3281 Overactive bladder: Secondary | ICD-10-CM

## 2023-07-21 DIAGNOSIS — E538 Deficiency of other specified B group vitamins: Secondary | ICD-10-CM | POA: Diagnosis not present

## 2023-07-21 LAB — CBC WITH DIFFERENTIAL/PLATELET
Basophils Absolute: 0 10*3/uL (ref 0.0–0.1)
Basophils Relative: 0.5 % (ref 0.0–3.0)
Eosinophils Absolute: 0.4 10*3/uL (ref 0.0–0.7)
Eosinophils Relative: 5.4 % — ABNORMAL HIGH (ref 0.0–5.0)
HCT: 42 % (ref 36.0–46.0)
Hemoglobin: 14.2 g/dL (ref 12.0–15.0)
Lymphocytes Relative: 18 % (ref 12.0–46.0)
Lymphs Abs: 1.5 10*3/uL (ref 0.7–4.0)
MCHC: 33.8 g/dL (ref 30.0–36.0)
MCV: 86.2 fl (ref 78.0–100.0)
Monocytes Absolute: 0.6 10*3/uL (ref 0.1–1.0)
Monocytes Relative: 6.7 % (ref 3.0–12.0)
Neutro Abs: 5.8 10*3/uL (ref 1.4–7.7)
Neutrophils Relative %: 69.4 % (ref 43.0–77.0)
Platelets: 203 10*3/uL (ref 150.0–400.0)
RBC: 4.88 Mil/uL (ref 3.87–5.11)
RDW: 14.9 % (ref 11.5–15.5)
WBC: 8.4 10*3/uL (ref 4.0–10.5)

## 2023-07-21 LAB — HEPATIC FUNCTION PANEL
ALT: 18 U/L (ref 0–35)
AST: 27 U/L (ref 0–37)
Albumin: 4.5 g/dL (ref 3.5–5.2)
Alkaline Phosphatase: 64 U/L (ref 39–117)
Bilirubin, Direct: 0.1 mg/dL (ref 0.0–0.3)
Total Bilirubin: 0.8 mg/dL (ref 0.2–1.2)
Total Protein: 6.7 g/dL (ref 6.0–8.3)

## 2023-07-21 LAB — URINALYSIS, ROUTINE W REFLEX MICROSCOPIC
Bilirubin Urine: NEGATIVE
Hgb urine dipstick: NEGATIVE
Ketones, ur: NEGATIVE
Nitrite: NEGATIVE
Specific Gravity, Urine: 1.01 (ref 1.000–1.030)
Total Protein, Urine: NEGATIVE
Urine Glucose: NEGATIVE
Urobilinogen, UA: 0.2 (ref 0.0–1.0)
pH: 7 (ref 5.0–8.0)

## 2023-07-21 LAB — BASIC METABOLIC PANEL WITH GFR
BUN: 23 mg/dL (ref 6–23)
CO2: 30 meq/L (ref 19–32)
Calcium: 9.7 mg/dL (ref 8.4–10.5)
Chloride: 99 meq/L (ref 96–112)
Creatinine, Ser: 0.99 mg/dL (ref 0.40–1.20)
GFR: 51.78 mL/min — ABNORMAL LOW (ref >60.00)
Glucose, Bld: 96 mg/dL (ref 70–99)
Potassium: 4.2 meq/L (ref 3.5–5.1)
Sodium: 138 meq/L (ref 135–145)

## 2023-07-21 LAB — HEMOGLOBIN A1C: Hgb A1c MFr Bld: 5.1 % (ref 4.6–6.5)

## 2023-07-21 LAB — LIPID PANEL
Cholesterol: 182 mg/dL (ref 0–200)
HDL: 56.6 mg/dL (ref >39.00)
LDL Cholesterol: 110 mg/dL — ABNORMAL HIGH (ref 0–99)
NonHDL: 125.25
Total CHOL/HDL Ratio: 3
Triglycerides: 76 mg/dL (ref 0.0–149.0)
VLDL: 15.2 mg/dL (ref 0.0–40.0)

## 2023-07-21 LAB — VITAMIN D 25 HYDROXY (VIT D DEFICIENCY, FRACTURES): VITD: 59.64 ng/mL (ref 30.00–100.00)

## 2023-07-21 LAB — VITAMIN B12: Vitamin B-12: 848 pg/mL (ref 211–911)

## 2023-07-21 LAB — TSH: TSH: 2.63 u[IU]/mL (ref 0.35–5.50)

## 2023-07-21 MED ORDER — GEMTESA 75 MG PO TABS: ORAL_TABLET | ORAL | Status: DC

## 2023-07-21 NOTE — Progress Notes (Signed)
 Patient ID: Julia Norton, female   DOB: [birth year], 88 y.o.   MRN: [id number]         Chief Complaint:: wellness exam and Medical Management of Chronic Issues (6 month follow up Pt states she has been taking her husband gemtesa and not taking her vesicare )  , htn, hld, ckd3a       HPI:  Julia Norton is a 88 y.o. female here for wellness exam; for shingrix at pharmacy, o/w up to date                        Also takes claritin 10 mg daily and usually allergies mild at worst;  had allergy reaction to eyes and race with flowers at the checkout.  Has OAB symptoms and gemtessa has worked but too expensive.  Does not want vesicare or other start.  Denies urinary symptoms such as dysuria, urgency, flank pain, hematuria or n/v, fever, chills. Does not want statin tx as well  Pt denies chest pain, increased sob or doe, wheezing, orthopnea, PND, increased LE swelling, palpitations, dizziness or syncope.   Pt denies polydipsia, polyuria, or new focal neuro s/s.     Wt Readings from Last 3 Encounters:  07/21/23 173 lb (78.5 kg)  01/17/23 174 lb (78.9 kg)  09/22/22 165 lb (74.8 kg)   BP Readings from Last 3 Encounters:  07/21/23 128/72  01/17/23 120/72  05/25/22 (!) 144/76   Immunization History  Administered Date(s) Administered   DTaP 04/18/2006   Fluad Quad(high Dose 65+) 01/20/2020, 01/01/2021, 01/12/2022   Influenza Split 01/17/2019   Influenza, High Dose Seasonal PF 01/15/2015, 01/29/2016   Influenza, Quadrivalent, Recombinant, Inj, Pf 01/30/2017, 01/26/2018, 12/14/2018   Influenza-Unspecified 01/21/2011, 01/16/2012, 01/03/2014   PFIZER Comirnaty(Gray Top)Covid-19 Tri-Sucrose Vaccine 11/03/2020   PFIZER(Purple Top)SARS-COV-2 Vaccination 06/01/2019, 06/26/2019, 02/04/2020   Pfizer Covid-19 Vaccine Bivalent Booster 43yrs & up 03/25/2021   Pneumococcal Conjugate-13 08/08/2019   Pneumococcal Polysaccharide-23 04/18/2006, 08/01/2006   Td 06/06/2006   Tdap 08/08/2019   Unspecified  SARS-COV-2 Vaccination 01/12/2022   Health Maintenance Due  Topic Date Due   Zoster Vaccines- Shingrix (1 of 2) Never done      Past Medical History:  Diagnosis Date   Allergic rhinitis    Asthma    Breast cancer (HCC) 1987   Erysipelas    Hemorrhoids    Hypertension    Hypothyroidism    Past Surgical History:  Procedure Laterality Date   BREAST LUMPECTOMY Right 1986   malignant   TONSILLECTOMY      reports that she quit smoking about 62 years ago. Her smoking use included cigarettes. She has never used smokeless tobacco. She reports that she does not drink alcohol and does not use drugs. family history includes Arthritis in her father, mother, and sister; COPD in her sister; Celiac disease in her sister; Heart disease in her mother; Heart disease (age of onset: 20) in her father; Heart disease (age of onset: 74) in her sister; Hypertension in her mother and sister. Allergies  Allergen Reactions   Penicillins     Does not remember the reaction to medication.   Current Outpatient Medications on File Prior to Visit  Medication Sig Dispense Refill   aspirin 81 MG tablet Take 2 tablets by mouth once daily     augmented betamethasone dipropionate (DIPROLENE-AF) 0.05 % cream Apply topically. 1-2 times per day     Cholecalciferol (VITAMIN D3) 25 MCG (1000 UT) CAPS Take by  mouth daily.     hydrALAZINE (APRESOLINE) 50 MG tablet TAKE 1 TABLET TWICE A DAY 180 tablet 2   levothyroxine (SYNTHROID) 100 MCG tablet Take 1 tablet (100 mcg total) by mouth daily. 90 tablet 3   loratadine (CLARITIN) 10 MG tablet Take 10 mg by mouth daily. (Patient not taking: Reported on 07/21/2023)     Magnesium 400 MG CAPS Take by mouth.     Multiple Vitamin (MULTIVITAMIN PO) Take 1 tablet by mouth daily.     Omega-3 Fatty Acids (FISH OIL) 1000 MG CAPS Take 1 capsule by mouth daily.     valsartan-hydrochlorothiazide (DIOVAN-HCT) 320-25 MG tablet Take 1 tablet by mouth daily. 90 tablet 1   No current  facility-administered medications on file prior to visit.        ROS:  All others reviewed and negative.  Objective        PE:  BP 128/72 (BP Location: Right Arm, Patient Position: Sitting, Cuff Size: Normal)   Pulse 67   Temp 98.5 F (36.9 C) (Oral)   Ht 5\' 8"  (1.727 m)   Wt 173 lb (78.5 kg)   SpO2 97%   BMI 26.30 kg/m                 Constitutional: Pt appears in NAD               HENT: Head: NCAT.                Right Ear: External ear normal.                 Left Ear: External ear normal.                Eyes: . Pupils are equal, round, and reactive to light. Conjunctivae and EOM are normal               Nose: without d/c or deformity               Neck: Neck supple. Gross normal ROM               Cardiovascular: Normal rate and regular rhythm.                 Pulmonary/Chest: Effort normal and breath sounds without rales or wheezing.                Abd:  Soft, NT, ND, + BS, no organomegaly               Neurological: Pt is alert. At baseline orientation, motor grossly intact               Skin: Skin is warm. No rashes, no other new lesions, LE edema - chronic 1+               Psychiatric: Pt behavior is normal without agitation   Micro: none  Cardiac tracings I have personally interpreted today:  none  Pertinent Radiological findings (summarize): none   Lab Results  Component Value Date   WBC 8.4 07/21/2023   HGB 14.2 07/21/2023   HCT 42.0 07/21/2023   PLT 203.0 07/21/2023   GLUCOSE 96 07/21/2023   CHOL 182 07/21/2023   TRIG 76.0 07/21/2023   HDL 56.60 07/21/2023   LDLCALC 110 (H) 07/21/2023   ALT 18 07/21/2023   AST 27 07/21/2023   NA 138 07/21/2023   K 4.2 07/21/2023   CL 99 07/21/2023   CREATININE 0.99 07/21/2023  BUN 23 07/21/2023   CO2 30 07/21/2023   TSH 2.63 07/21/2023   HGBA1C 5.1 07/21/2023   Assessment/Plan:  Julia Norton is a 88 y.o. White or Caucasian [1] female with  has a past medical history of Allergic rhinitis, Asthma, Breast  cancer (HCC) (1987), Erysipelas, Hemorrhoids, Hypertension, and Hypothyroidism.  Encounter for well adult exam with abnormal findings Age and sex appropriate education and counseling updated with regular exercise and diet Referrals for preventative services - none needed Immunizations addressed - for shingrix at pharmacy Smoking counseling  - none needed Evidence for depression or other mood disorder - none significant Most recent labs reviewed. I have personally reviewed and have noted: 1) the patient's medical and social history 2) The patient's current medications and supplements 3) The patient's height, weight, and BMI have been recorded in the chart   Hypertension BP Readings from Last 3 Encounters:  07/21/23 128/72  01/17/23 120/72  05/25/22 (!) 144/76   Stable, pt to continue medical treatment hydralazine 50 bid, diovan hct 320 25 qd   Allergic rhinitis With flare of conjunctivits yesterday now with mild erythema and itching; for otc zaditor and/or optivar, and otc nasacort asd  Pure hypercholesterolemia Lab Results  Component Value Date   LDLCALC 110 (H) 07/21/2023   Mild ill,, pt to continue low chol diet, declines statin   CKD (chronic kidney disease) stage 3, GFR 30-59 ml/min (HCC) Lab Results  Component Value Date   CREATININE 0.99 07/21/2023   Stable overall, cont to avoid nephrotoxins   OAB (overactive bladder) Gemtessa too expensive, pt to call if wants generic such as vesicare restart  Followup: Return in about 6 months (around 01/20/2024).  Oliver Barre, MD 07/23/2023 7:43 PM [facility]

## 2023-07-21 NOTE — Progress Notes (Signed)
 The test results show that your current treatment is OK, as the tests are stable.  Please continue the same plan.  There is no other need for change of treatment or further evaluation based on these results, at this time.  thanks

## 2023-07-21 NOTE — Patient Instructions (Addendum)
 Ok to use the [employer] as needed, or call if you would want the rx for Optivar  Also can try the OTC Nasacort for allergy even involving the eyes as well  Please call if you want to change the Gemtesa to Oxybutinin  Please have your Shingrix (shingles) shots done at your local pharmacy.  Please continue all other medications as before, and refills have been done if requested.  Please have the pharmacy call with any other refills you may need.  Please continue your efforts at being more active, low cholesterol diet, and weight control.  You are otherwise up to date with prevention measures today.  Please keep your appointments with your specialists as you may have planned  Please go to the LAB at the blood drawing area for the tests to be done  You will be contacted by phone if any changes need to be made immediately.  Otherwise, you will receive a letter about your results with an explanation, but please check with MyChart first.  Please make an Appointment to return in 6 months, or sooner if needed

## 2023-07-23 ENCOUNTER — Encounter: Payer: Self-pay | Admitting: Internal Medicine

## 2023-07-23 NOTE — Assessment & Plan Note (Signed)

## 2023-07-23 NOTE — Assessment & Plan Note (Signed)
 With flare of conjunctivits yesterday now with mild erythema and itching; for otc zaditor and/or optivar, and otc nasacort asd

## 2023-07-23 NOTE — Assessment & Plan Note (Signed)
 Lab Results  Component Value Date   LDLCALC 110 (H) 07/21/2023   Mild ill,, pt to continue low chol diet, declines statin

## 2023-07-23 NOTE — Assessment & Plan Note (Signed)
 Lab Results  Component Value Date   CREATININE 0.99 07/21/2023   Stable overall, cont to avoid nephrotoxins

## 2023-07-23 NOTE — Assessment & Plan Note (Signed)
 Gemtessa too expensive, pt to call if wants generic such as vesicare restart

## 2023-07-23 NOTE — Assessment & Plan Note (Signed)
 BP Readings from Last 3 Encounters:  07/21/23 128/72  01/17/23 120/72  05/25/22 (!) 144/76   Stable, pt to continue medical treatment hydralazine 50 bid, diovan hct 320 25 qd

## 2023-08-12 ENCOUNTER — Other Ambulatory Visit: Payer: Self-pay | Admitting: Internal Medicine

## 2023-08-14 ENCOUNTER — Other Ambulatory Visit: Payer: Self-pay

## 2023-09-14 ENCOUNTER — Other Ambulatory Visit: Payer: Self-pay | Admitting: Internal Medicine

## 2023-09-15 ENCOUNTER — Other Ambulatory Visit: Payer: Self-pay

## 2023-09-28 ENCOUNTER — Ambulatory Visit: Payer: Medicare Other

## 2023-09-28 VITALS — Ht 68.0 in | Wt 173.0 lb

## 2023-09-28 DIAGNOSIS — Z0000 Encounter for general adult medical examination without abnormal findings: Secondary | ICD-10-CM | POA: Diagnosis not present

## 2023-09-28 NOTE — Patient Instructions (Signed)
 Ms. Ikner , Thank you for taking time out of your busy schedule to complete your Annual Wellness Visit with me. I enjoyed our conversation and look forward to speaking with you again next year. I, as well as your care team,  appreciate your ongoing commitment to your health goals. Please review the following plan we discussed and let me know if I can assist you in the future. Your Game plan/ To Do List    Follow up Visits: Next Medicare AWV with our clinical staff: 10/03/2024.   Have you seen your provider in the last 6 months (3 months if uncontrolled diabetes)? Yes Next Office Visit with your provider: Last office visit on 07/21/2023.   Clinician Recommendations:  Aim for 30 minutes of exercise or brisk walking, 6-8 glasses of water, and 5 servings of fruits and vegetables each day. You are due for a Shingles vaccine and can get that at your local pharmacy.      This is a list of the screening recommended for you and due dates:  Health Maintenance  Topic Date Due   Zoster (Shingles) Vaccine (1 of 2) Never done   Flu Shot  11/17/2023   Medicare Annual Wellness Visit  09/27/2024   DTaP/Tdap/Td vaccine (4 - Td or Tdap) 08/07/2029   Pneumococcal Vaccine for age over 28  Completed   DEXA scan (bone density measurement)  Completed   HPV Vaccine  Aged Out   Meningitis B Vaccine  Aged Out   COVID-19 Vaccine  Discontinued    Advanced directives: (Copy Requested) Please bring a copy of your health care power of attorney and living will to the office to be added to your chart at your convenience. You can mail to [facility] [address] [place], [place] [address] or email to [email or web address] Advance Care Planning is important because it:  [x]  Makes sure you receive the medical care that is consistent with your values, goals, and preferences  [x]  It provides guidance to your family and loved ones and reduces their decisional burden about whether or not they are  making the right decisions based on your wishes.  Follow the link provided in your after visit summary or read over the paperwork we have mailed to you to help you started getting your Advance Directives in place. If you need assistance in completing these, please reach out to us  so that we can help you!  See attachments for Preventive Care and Fall Prevention Tips.

## 2023-09-28 NOTE — Progress Notes (Signed)
 Subjective:   Julia Norton is a 88 y.o. who presents for a Medicare Wellness preventive visit.  As a reminder, Annual Wellness Visits don't include a physical exam, and some assessments may be limited, especially if this visit is performed virtually. We may recommend an in-person follow-up visit with your provider if needed.  Visit Complete: Virtual I connected with  Julia Norton on 09/28/23 by a audio enabled telemedicine application and verified that I am speaking with the correct person using two identifiers.  Patient Location: Home  Provider Location: Office/Clinic  I discussed the limitations of evaluation and management by telemedicine. The patient expressed understanding and agreed to proceed.  Vital Signs: Because this visit was a virtual/telehealth visit, some criteria may be missing or patient reported. Any vitals not documented were not able to be obtained and vitals that have been documented are patient reported.  VideoDeclined- This patient declined Librarian, academic. Therefore the visit was completed with audio only.  Persons Participating in Visit: Patient.  AWV Questionnaire: No: Patient Medicare AWV questionnaire was not completed prior to this visit.  Cardiac Risk Factors include: advanced age (>3men, >41 women);hypertension;Other (see comment);dyslipidemia, Risk factor comments: CKD     Objective:    Today's Vitals   09/28/23 1504  Weight: 173 lb (78.5 kg)  Height: 5' 8 (1.727 m)   Body mass index is 26.3 kg/m.     09/28/2023    3:24 PM 09/22/2022    1:07 PM 08/10/2021    1:39 PM 03/26/2021   11:56 AM 05/18/2020    2:54 PM  Advanced Directives  Does Patient Have a Medical Advance Directive? Yes Yes Yes Yes Yes  Type of Estate agent of [place];Living will Healthcare Power of [place];Living will Living will;Healthcare Power of [employer] Power of [place];Living will Healthcare Power  of [place];Living will  Does patient want to make changes to medical advance directive?   No - Patient declined    Copy of Healthcare Power of Attorney in Chart? No - copy requested No - copy requested No - copy requested      Current Medications (verified) Outpatient Encounter Medications as of 09/28/2023  Medication Sig   aspirin 81 MG tablet Take 2 tablets by mouth once daily   augmented betamethasone dipropionate (DIPROLENE-AF) 0.05 % cream Apply topically. 1-2 times per day   b complex vitamins capsule Take 1 capsule by mouth daily.   Cholecalciferol (VITAMIN D3) 25 MCG (1000 UT) CAPS Take by mouth daily.   hydrALAZINE  (APRESOLINE ) 50 MG tablet TAKE 1 TABLET TWICE A DAY   loratadine (CLARITIN) 10 MG tablet Take 10 mg by mouth daily.   Magnesium 400 MG CAPS Take by mouth.   Multiple Vitamin (MULTIVITAMIN PO) Take 1 tablet by mouth daily.   Omega-3 Fatty Acids (FISH OIL) 1000 MG CAPS Take 1 capsule by mouth daily.   SYNTHROID  100 MCG tablet TAKE 1 TABLET DAILY   valsartan -hydrochlorothiazide  (DIOVAN -HCT) 320-25 MG tablet TAKE 1 TABLET DAILY   Vibegron  (GEMTESA ) 75 MG TABS 1 tab by mouth once daily   No facility-administered encounter medications on file as of 09/28/2023.    Allergies (verified) Penicillins   History: Past Medical History:  Diagnosis Date   Allergic rhinitis    Asthma    Breast cancer (HCC) 1987   Erysipelas    Hemorrhoids    Hypertension    Hypothyroidism    Past Surgical History:  Procedure Laterality Date   BREAST LUMPECTOMY Right  1986   malignant   TONSILLECTOMY     Family History  Problem Relation Age of Onset   Celiac disease Sister    Heart disease Sister 54       heart attack, died 7   Arthritis Sister    COPD Sister    Hypertension Sister    Heart disease Mother        CHF   Arthritis Mother    Hypertension Mother    Heart disease Father 67       heart attack   Arthritis Father    Social History   Socioeconomic History    Marital status: Married    Spouse name: John   Number of children: 3   Years of education: Not on file   Highest education level: High school graduate  Occupational History   Occupation: RETIRED  Tobacco Use   Smoking status: Former    Current packs/day: 0.00    Types: Cigarettes    Quit date: 04/18/1961    Years since quitting: 62.4   Smokeless tobacco: Never  Vaping Use   Vaping status: Never Used  Substance and Sexual Activity   Alcohol use: Never   Drug use: Never   Sexual activity: Not Currently  Other Topics Concern   Not on file  Social History Narrative   Lives with husband/2025  She has three children and 7 grands and one great.       Caffeine: very little (chocolate), drinks decaf coffee   Social Drivers of Corporate investment banker Strain: Low Risk  (09/28/2023)   Overall Financial Resource Strain (CARDIA)    Difficulty of Paying Living Expenses: Not hard at all  Food Insecurity: No Food Insecurity (09/28/2023)   Hunger Vital Sign    Worried About Running Out of Food in the Last Year: Never true    Ran Out of Food in the Last Year: Never true  Transportation Needs: No Transportation Needs (09/28/2023)   PRAPARE - Administrator, Civil Service (Medical): No    Lack of Transportation (Non-Medical): No  Physical Activity: Sufficiently Active (08/10/2021)   Exercise Vital Sign    Days of Exercise per Week: 5 days    Minutes of Exercise per Session: 30 min  Stress: No Stress Concern Present (08/10/2021)   [employer] of Occupational Health - Occupational Stress Questionnaire    Feeling of Stress : Not at all  Social Connections: Moderately Isolated (08/10/2021)   Social Connection and Isolation Panel    Frequency of Communication with Friends and Family: More than three times a week    Frequency of Social Gatherings with Friends and Family: More than three times a week    Attends Religious Services: Never    Database administrator or Organizations:  No    Attends Engineer, structural: Never    Marital Status: Married    Tobacco Counseling Counseling given: Not Answered    Clinical Intake:  Pre-visit preparation completed: Yes  Pain : No/denies pain     BMI - recorded: 26.3 Nutritional Status: BMI 25 -29 Overweight Nutritional Risks: None Diabetes: No  Lab Results  Component Value Date   HGBA1C 5.1 07/21/2023   HGBA1C 4.8 05/17/2022   HGBA1C 5.1 11/11/2021     How often do you need to have someone help you when you read instructions, pamphlets, or other written materials from your doctor or pharmacy?: 1 - Never  Interpreter Needed?: No  Information entered by ::  Abu Heavin, RMA   Activities of Daily Living     09/28/2023    3:04 PM  In your present state of health, do you have any difficulty performing the following activities:  Hearing? 0  Vision? 0  Difficulty concentrating or making decisions? 0  Walking or climbing stairs? 0  Dressing or bathing? 0  Doing errands, shopping? 0  Preparing Food and eating ? N  Using the Toilet? N  In the past six months, have you accidently leaked urine? Y  Comment OAB  Do you have problems with loss of bowel control? N  Managing your Medications? N  Managing your Finances? N  Housekeeping or managing your Housekeeping? N    Patient Care Team: Roslyn Coombe, MD as PCP - General (Internal Medicine) Eilleen Grates, MD as PCP - Cardiology (Cardiology) Dema Filler, MD as Consulting Physician (Ophthalmology)  I have updated your Care Teams any recent Medical Services you may have received from other providers in the past year.     Assessment:   This is a routine wellness examination for Julia Norton.  Hearing/Vision screen Hearing Screening - Comments:: Denies hearing difficulties   Vision Screening - Comments:: wears eyeglasses for reading/had cataract surgery/ Dr. Juanito Norma   Goals Addressed   None    Depression Screen     07/21/2023    1:13  PM 01/17/2023    1:14 PM 09/22/2022    1:15 PM 05/17/2022    1:34 PM 05/17/2022    1:10 PM 11/11/2021    1:11 PM 08/10/2021    1:29 PM  PHQ 2/9 Scores  PHQ - 2 Score 0 0 0 0 0 0 0  PHQ- 9 Score   1  0      Fall Risk     09/28/2023    3:24 PM 07/21/2023    1:19 PM 01/17/2023    1:14 PM 09/22/2022    1:10 PM 05/17/2022    1:34 PM  Fall Risk   Falls in the past year? 0 0 0 0 0  Number falls in past yr: 0 0 0 0 0  Injury with Fall? 0 0 0 0 0  Risk for fall due to : No Fall Risks No Fall Risks No Fall Risks No Fall Risks   Follow up Falls evaluation completed;Falls prevention discussed Falls evaluation completed Falls evaluation completed Falls prevention discussed     MEDICARE RISK AT HOME:  Medicare Risk at Home Any stairs in or around the home?: Yes If so, are there any without handrails?: No Home free of loose throw rugs in walkways, pet beds, electrical cords, etc?: Yes Adequate lighting in your home to reduce risk of falls?: Yes Life alert?: Yes (has one but does not wear it.) Use of a cane, walker or w/c?: Yes (cane) Grab bars in the bathroom?: Yes Shower chair or bench in shower?: Yes Elevated toilet seat or a handicapped toilet?: Yes  TIMED UP AND GO:  Was the test performed?  No  Cognitive Function: Declined/Normal: No cognitive concerns noted by patient or family. Patient alert, oriented, able to answer questions appropriately and recall recent events. No signs of memory loss or confusion.        09/22/2022    1:10 PM 08/10/2021    1:49 PM  6CIT Screen  What Year? 0 points 0 points  What month? 0 points 0 points  What time? 0 points 0 points  Count back from 20 0 points 0 points  Months in  reverse 0 points 0 points  Repeat phrase 0 points 0 points  Total Score 0 points 0 points    Immunizations Immunization History  Administered Date(s) Administered   DTaP 04/18/2006   Fluad Quad(high Dose 65+) 01/20/2020, 01/01/2021, 01/12/2022   Influenza Split 01/17/2019    Influenza, High Dose Seasonal PF 01/15/2015, 01/29/2016   Influenza, Quadrivalent, Recombinant, Inj, Pf 01/30/2017, 01/26/2018, 12/14/2018   Influenza-Unspecified 01/21/2011, 01/16/2012, 01/03/2014   PFIZER Comirnaty(Gray Top)Covid-19 Tri-Sucrose Vaccine 11/03/2020   PFIZER(Purple Top)SARS-COV-2 Vaccination 06/01/2019, 06/26/2019, 02/04/2020   Pfizer Covid-19 Vaccine Bivalent Booster 89yrs & up 03/25/2021   Pneumococcal Conjugate-13 08/08/2019   Pneumococcal Polysaccharide-23 04/18/2006, 08/01/2006   Td 06/06/2006   Tdap 08/08/2019   Unspecified SARS-COV-2 Vaccination 01/12/2022    Screening Tests Health Maintenance  Topic Date Due   Zoster Vaccines- Shingrix (1 of 2) Never done   INFLUENZA VACCINE  11/17/2023   Medicare Annual Wellness (AWV)  09/27/2024   DTaP/Tdap/Td (4 - Td or Tdap) 08/07/2029   Pneumococcal Vaccine: 50+ Years  Completed   DEXA SCAN  Completed   HPV VACCINES  Aged Out   Meningococcal B Vaccine  Aged Out   COVID-19 Vaccine  Discontinued    Health Maintenance  Health Maintenance Due  Topic Date Due   Zoster Vaccines- Shingrix (1 of 2) Never done   Health Maintenance Items Addressed: See Nurse Notes at the end of this note  Additional Screening:  Vision Screening: Recommended annual ophthalmology exams for early detection of glaucoma and other disorders of the eye. Would you like a referral to an eye doctor? No    Dental Screening: Recommended annual dental exams for proper oral hygiene  Community Resource Referral / Chronic Care Management: CRR required this visit?  No   CCM required this visit?  No   Plan:    I have personally reviewed and noted the following in the patient's chart:   Medical and social history Use of alcohol, tobacco or illicit drugs  Current medications and supplements including opioid prescriptions. Patient is not currently taking opioid prescriptions. Functional ability and status Nutritional status Physical  activity Advanced directives List of other physicians Hospitalizations, surgeries, and ER visits in previous 12 months Vitals Screenings to include cognitive, depression, and falls Referrals and appointments  In addition, I have reviewed and discussed with patient certain preventive protocols, quality metrics, and best practice recommendations. A written personalized care plan for preventive services as well as general preventive health recommendations were provided to patient.   Julia Norton, CMA   09/28/2023   After Visit Summary: (MyChart) Due to this being a telephonic visit, the after visit summary with patients personalized plan was offered to patient via MyChart   Notes: Please refer to Routing Comments.

## 2023-12-15 ENCOUNTER — Telehealth: Payer: Self-pay

## 2023-12-15 ENCOUNTER — Ambulatory Visit (HOSPITAL_COMMUNITY): Admission: EM | Admit: 2023-12-15 | Discharge: 2023-12-15 | Disposition: A | Discharge status: Home / Self Care

## 2023-12-15 ENCOUNTER — Ambulatory Visit: Payer: Self-pay

## 2023-12-15 ENCOUNTER — Encounter (HOSPITAL_COMMUNITY): Payer: Self-pay | Admitting: Emergency Medicine

## 2023-12-15 DIAGNOSIS — W5503XA Scratched by cat, initial encounter: Secondary | ICD-10-CM | POA: Diagnosis not present

## 2023-12-15 DIAGNOSIS — S61459A Open bite of unspecified hand, initial encounter: Secondary | ICD-10-CM | POA: Diagnosis not present

## 2023-12-15 DIAGNOSIS — W5501XA Bitten by cat, initial encounter: Secondary | ICD-10-CM | POA: Diagnosis not present

## 2023-12-15 MED ORDER — AZITHROMYCIN 250 MG PO TABS: ORAL_TABLET | ORAL | 0 refills | Status: DC

## 2023-12-15 MED ORDER — BACITRACIN ZINC 500 UNIT/GM EX OINT
TOPICAL_OINTMENT | Freq: Once | CUTANEOUS | Status: AC
  Administered 2023-12-15: 1 via TOPICAL

## 2023-12-15 MED ORDER — CLINDAMYCIN HCL 300 MG PO CAPS: 300.0000 mg | ORAL_CAPSULE | Freq: Three times a day (TID) | ORAL | 0 refills | Status: AC

## 2023-12-15 MED ORDER — DOXYCYCLINE HYCLATE 100 MG PO CAPS
100.0000 mg | ORAL_CAPSULE | Freq: Two times a day (BID) | ORAL | 0 refills | Status: AC
Start: 2023-12-15 — End: 2023-12-22

## 2023-12-15 NOTE — Telephone Encounter (Signed)
 Copied from CRM [id number]. Topic: Clinical - Red Word Triage >> Dec 15, 2023 12:32 PM Taleah C wrote: Red Word that prompted transfer to Nurse Triage: believes cat might've deeply cut her on her left hand  while in bed. She was bleeding a lot last night and now her hand is swollen. Pain is going up her arm. >> Dec 15, 2023  3:10 PM Drema MATSU wrote: Patient is calling to follow up on request. She wants to know if something will be called in for her. She wants to know if it will be called in before clinic closes so she can let her daughter know.

## 2023-12-15 NOTE — ED Provider Notes (Signed)
 [facility]  Note:  This document was prepared using Dragon voice recognition software and may include unintentional dictation errors.  MRN: [id number] DOB: 1935-06-08  Subjective:   Julia Norton is a 88 y.o. female presenting for a small laceration to the posterior left hand from her cat.  Patient does not know if she was bit by her cat or scratched.  Patient states that she sat on her bed the cat was close by and got startled and injured the hand.  Patient reports swelling and erythema and pain.  Patient was concerned for possible infection which prompted her visit to the urgent care for evaluation.  Patient is up-to-date on her tetanus most recently in 2021.  No current facility-administered medications for this encounter.  Current Outpatient Medications:    azithromycin  (ZITHROMAX  Z-PAK) 250 MG tablet, Take 500 mg on day 1 followed by 250 mg for 4 days.  Take medication for a total of 5 days., Disp: 6 tablet, Rfl: 0   clindamycin  (CLEOCIN ) 300 MG capsule, Take 1 capsule (300 mg total) by mouth 3 (three) times daily for 7 days., Disp: 21 capsule, Rfl: 0   doxycycline  (VIBRAMYCIN ) 100 MG capsule, Take 1 capsule (100 mg total) by mouth 2 (two) times daily for 7 days., Disp: 14 capsule, Rfl: 0   aspirin 81 MG tablet, Take 2 tablets by mouth once daily, Disp: , Rfl:    augmented betamethasone dipropionate (DIPROLENE-AF) 0.05 % cream, Apply topically. 1-2 times per day, Disp: , Rfl:    b complex vitamins capsule, Take 1 capsule by mouth daily., Disp: , Rfl:    Cholecalciferol (VITAMIN D3) 25 MCG (1000 UT) CAPS, Take by mouth daily., Disp: , Rfl:    hydrALAZINE  (APRESOLINE ) 50 MG tablet, TAKE 1 TABLET TWICE A DAY, Disp: 180 tablet, Rfl: 2   loratadine (CLARITIN) 10 MG tablet, Take 10 mg by mouth daily., Disp: , Rfl:    Magnesium 400 MG CAPS, Take by mouth., Disp: , Rfl:    Multiple Vitamin (MULTIVITAMIN PO), Take 1 tablet by mouth daily., Disp: , Rfl:    Omega-3 Fatty Acids  (FISH OIL) 1000 MG CAPS, Take 1 capsule by mouth daily., Disp: , Rfl:    SYNTHROID  100 MCG tablet, TAKE 1 TABLET DAILY, Disp: 90 tablet, Rfl: 3   valsartan -hydrochlorothiazide  (DIOVAN -HCT) 320-25 MG tablet, TAKE 1 TABLET DAILY, Disp: 90 tablet, Rfl: 1   Vibegron  (GEMTESA ) 75 MG TABS, 1 tab by mouth once daily, Disp: , Rfl:    Allergies  Allergen Reactions   Penicillins     Does not remember the reaction to medication.    Past Medical History:  Diagnosis Date   Allergic rhinitis    Asthma    Breast cancer (HCC) 1987   Erysipelas    Hemorrhoids    Hypertension    Hypothyroidism      Past Surgical History:  Procedure Laterality Date   BREAST LUMPECTOMY Right 1986   malignant   TONSILLECTOMY      Family History  Problem Relation Age of Onset   Celiac disease Sister    Heart disease Sister 57       heart attack, died 62   Arthritis Sister    COPD Sister    Hypertension Sister    Heart disease Mother        CHF   Arthritis Mother    Hypertension Mother    Heart disease Father 64       heart attack   Arthritis  Father     Social History   Tobacco Use   Smoking status: Former    Current packs/day: 0.00    Types: Cigarettes    Quit date: 04/18/1961    Years since quitting: 62.7   Smokeless tobacco: Never  Vaping Use   Vaping status: Never Used  Substance Use Topics   Alcohol use: Never   Drug use: Never    ROS Refer to HPI for ROS details.  Objective:   Vitals: BP (!) 189/77 (BP Location: Right Arm)   Pulse 70   Temp 97.8 F (36.6 C) (Oral)   Resp 20   Physical Exam Vitals and nursing note reviewed.  Constitutional:      General: She is not in acute distress.    Appearance: Normal appearance. She is not ill-appearing.  HENT:     Head: Normocephalic.  Cardiovascular:     Rate and Rhythm: Normal rate.  Pulmonary:     Effort: Pulmonary effort is normal. No respiratory distress.  Skin:    General: Skin is warm and dry.     Capillary Refill:  Capillary refill takes less than 2 seconds.     Findings: Bruising, erythema and wound present.      Neurological:     General: No focal deficit present.     Mental Status: She is alert and oriented to person, place, and time.  Psychiatric:        Mood and Affect: Mood normal.        Behavior: Behavior normal.     Procedures  No results found for this or any previous visit (from the past 24 hours).  No results found.   Assessment and Plan :     Discharge Instructions       1. Cat bite of hand, initial encounter (Primary) - doxycycline  (VIBRAMYCIN ) 100 MG capsule; Take 1 capsule (100 mg total) by mouth 2 (two) times daily for 7 days.  Dispense: 14 capsule; Refill: 0 - clindamycin  (CLEOCIN ) 300 MG capsule; Take 1 capsule (300 mg total) by mouth 3 (three) times daily for 7 days.  Dispense: 21 capsule; Refill: 0  2. Cat scratch - azithromycin  (ZITHROMAX  Z-PAK) 250 MG tablet; Take 500 mg on day 1 followed by 250 mg for 4 days.  Take medication for a total of 5 days.  Dispense: 6 tablet; Refill: 0 -Continue to monitor symptoms for any change in severity if there is any escalation of current symptoms or development of new symptoms follow-up in ER for further evaluation and possible need for IV antibiotics.      Lanay Zinda B Janmarie Smoot   [place], [place], [place] 12/15/23 [phone]

## 2023-12-15 NOTE — ED Triage Notes (Signed)
 Pt st's she was bitten by her own cat this am or scratched   Pt has lac to top of left hand.  Pt st's cat's shots are up to date

## 2023-12-15 NOTE — Discharge Instructions (Addendum)
  1. Cat bite of hand, initial encounter (Primary) - doxycycline  (VIBRAMYCIN ) 100 MG capsule; Take 1 capsule (100 mg total) by mouth 2 (two) times daily for 7 days.  Dispense: 14 capsule; Refill: 0 - clindamycin  (CLEOCIN ) 300 MG capsule; Take 1 capsule (300 mg total) by mouth 3 (three) times daily for 7 days.  Dispense: 21 capsule; Refill: 0  2. Cat scratch - azithromycin  (ZITHROMAX  Z-PAK) 250 MG tablet; Take 500 mg on day 1 followed by 250 mg for 4 days.  Take medication for a total of 5 days.  Dispense: 6 tablet; Refill: 0 -Continue to monitor symptoms for any change in severity if there is any escalation of current symptoms or development of new symptoms follow-up in ER for further evaluation and possible need for IV antibiotics.

## 2023-12-15 NOTE — Telephone Encounter (Signed)
 FYI Only or Action Required?: Action required by provider: [place] requesting medication and a call back.  Patient was last seen in primary care on 07/21/2023 by Norleen Lynwood ORN, MD.  Called Nurse Triage reporting Laceration.  Symptoms began today.  Interventions attempted: OTC medications: Ibuprofen and Ice/heat application.  Symptoms are: gradually worsening.  Triage Disposition: See HCP Within 4 Hours (Or PCP Triage)  Patient/caregiver understands and will follow disposition?: Unsure    Copied from CRM [id number]. Topic: Clinical - Red Word Triage >> Dec 15, 2023 12:32 PM Taleah C wrote: Red Word that prompted transfer to Nurse Triage: believes cat might've deeply cut her on her left hand  while in bed. She was bleeding a lot last night and now her hand is swollen. Pain is going up her arm. Reason for Disposition  [1] Large swelling or bruise (> 2 inches or 5 cm) AND [2] can't use injured hand normally (e.g., make a fist, open hand fully, hold a glass of water)  Answer Assessment - Initial Assessment Questions Patient states she has an indoor cat that has not been to vet in the last 2 years. Cat scratched her on the top of her L hand. Area was bleeding but that has now stopped. Used soap and water, antibiotic cream and band aid on area. Also took 2 ibuprofen and used ice on hand. Patient states she is unable to use her hand and is unable to get in to be seen. Pain is going into fingers and up to her arm. Denies weakness, numbness, or tingling. Patient offered an appointment at [facility] for symptoms. Pt states she is unable to make it to be seen and states she has a daughter who is available to pick up medication for her. Pt requesting antibiotics for symptoms. Patient requesting a call back regarding her request. Will route to office for follow up.      1. MECHANISM: How did the injury happen?     Patient scratched by her cat  2. ONSET: When did the injury happen? (e.g., minutes, hours  ago)      Early this morning  3. APPEARANCE of INJURY: What does the injury look like?      Hand is swollen  4. SEVERITY: Can you use your hand normally? Can you bend your fingers into a ball and then fully open them?     Can't ball into a fist can open hand up  5. SIZE: For cuts, bruises, or swelling, ask: How large is it? (e.g., inches or centimeters; entire hand)      2 inches  6. PAIN: How bad is the pain? (Scale 0-10; or none, mild, moderate, severe)     10 7. TETANUS: For any breaks in the skin, ask: When was your last tetanus booster?     Up to date on shots  8. OTHER SYMPTOMS: Do you have any other symptoms?      Denies  Protocols used: Hand Injury-A-AH

## 2023-12-20 NOTE — Telephone Encounter (Signed)
 Unfortunately I was not able to get this back to Dr.John before end of day. Patient has since been seen by Urgent Care

## 2023-12-20 NOTE — Telephone Encounter (Signed)
 Hopefully she is improving - no new orders thanks

## 2023-12-25 ENCOUNTER — Encounter (HOSPITAL_COMMUNITY): Payer: Self-pay | Admitting: *Deleted

## 2023-12-25 ENCOUNTER — Ambulatory Visit (HOSPITAL_COMMUNITY)
Admission: EM | Admit: 2023-12-25 | Discharge: 2023-12-25 | Disposition: A | Discharge status: Home / Self Care | Attending: Family Medicine | Admitting: Family Medicine

## 2023-12-25 DIAGNOSIS — R21 Rash and other nonspecific skin eruption: Secondary | ICD-10-CM | POA: Diagnosis not present

## 2023-12-25 LAB — CBC WITH DIFFERENTIAL/PLATELET
Abs Immature Granulocytes: 0.02 K/uL (ref 0.00–0.07)
Basophils Absolute: 0.1 K/uL (ref 0.0–0.1)
Basophils Relative: 1 %
Eosinophils Absolute: 0.5 K/uL (ref 0.0–0.5)
Eosinophils Relative: 7 %
HCT: 41.1 % (ref 36.0–46.0)
Hemoglobin: 13.8 g/dL (ref 12.0–15.0)
Immature Granulocytes: 0 %
Lymphocytes Relative: 16 %
Lymphs Abs: 1.3 K/uL (ref 0.7–4.0)
MCH: 29.2 pg (ref 26.0–34.0)
MCHC: 33.6 g/dL (ref 30.0–36.0)
MCV: 87.1 fL (ref 80.0–100.0)
Monocytes Absolute: 0.6 K/uL (ref 0.1–1.0)
Monocytes Relative: 8 %
Neutro Abs: 5.4 K/uL (ref 1.7–7.7)
Neutrophils Relative %: 68 %
Platelets: 248 K/uL (ref 150–400)
RBC: 4.72 MIL/uL (ref 3.87–5.11)
RDW: 14.2 % (ref 11.5–15.5)
WBC: 7.9 K/uL (ref 4.0–10.5)
nRBC: 0 % (ref 0.0–0.2)

## 2023-12-25 LAB — COMPREHENSIVE METABOLIC PANEL WITH GFR
ALT: 20 U/L (ref 0–44)
AST: 32 U/L (ref 15–41)
Albumin: 4 g/dL (ref 3.5–5.0)
Alkaline Phosphatase: 67 U/L (ref 38–126)
Anion gap: 10 (ref 5–15)
BUN: 21 mg/dL (ref 8–23)
CO2: 26 mmol/L (ref 22–32)
Calcium: 9.4 mg/dL (ref 8.9–10.3)
Chloride: 102 mmol/L (ref 98–111)
Creatinine, Ser: 1 mg/dL (ref 0.44–1.00)
GFR, Estimated: 55 mL/min — ABNORMAL LOW (ref >60)
Glucose, Bld: 91 mg/dL (ref 70–99)
Potassium: 4.1 mmol/L (ref 3.5–5.1)
Sodium: 138 mmol/L (ref 135–145)
Total Bilirubin: 1.1 mg/dL (ref 0.0–1.2)
Total Protein: 6.6 g/dL (ref 6.5–8.1)

## 2023-12-25 MED ORDER — PREDNISONE 20 MG PO TABS: 40.0000 mg | ORAL_TABLET | Freq: Every day | ORAL | 0 refills | Status: AC

## 2023-12-25 MED ORDER — DEXAMETHASONE SODIUM PHOSPHATE 10 MG/ML IJ SOLN
INTRAMUSCULAR | Status: AC
  Filled 2023-12-25: qty 1

## 2023-12-25 MED ORDER — DEXAMETHASONE SODIUM PHOSPHATE 10 MG/ML IJ SOLN
10.0000 mg | Freq: Once | INTRAMUSCULAR | Status: AC
  Administered 2023-12-25: 10 mg via INTRAMUSCULAR

## 2023-12-25 NOTE — ED Triage Notes (Signed)
 Pt states she finished all the meds from her last visit on Friday. On Friday night her left hand is itching, she used peroxide on the area several time. She has been using a lotion since. She now has a rash over her entire body. She is taking no meds for itchy.

## 2023-12-25 NOTE — Discharge Instructions (Addendum)
 You were seen today for a rash.  At this time I think this may be due to recent antibiotic use.  I have ordered blood work for work up.  I have given you a shot of a steroid while here today as well, and sent out an oral steroid to start tomorrow.  Please keep your feel elevated as much as you can to help with swelling.  If your rash is not improving, or is worsening over the next 24 hrs, then please go to the ER for further evaluation.

## 2023-12-25 NOTE — ED Provider Notes (Signed)
 [facility]    CSN: [id number] Arrival date & time: 12/25/23  1240      History   Chief Complaint Chief Complaint  Patient presents with   Rash    HPI Julia Norton is a 88 y.o. female.    Rash  Patient is here for a rash.  She was here for a cat bite/scratch about 10 days ago.  She was given zpack and doxy to treat that.  She finished the abx about 3 days ago.  That evening she started with generalized itching.  She noted the rash/redness last evening.   At this time she is itching on her face and around her breast primarily.  Her feet were swollen this morning, with redness from the mid foot to the toes.  No otc meds taken for the rash/itching.  No changes in any topicals, no changes in her regular medications.       Past Medical History:  Diagnosis Date   Allergic rhinitis    Asthma    Breast cancer (HCC) 1987   Erysipelas    Hemorrhoids    Hypertension    Hypothyroidism     Patient Active Problem List   Diagnosis Date Noted   Gait disorder 01/20/2023   Bruit 05/23/2022   Chronic constipation 05/17/2022   Chronic venous stasis 05/17/2022   Microscopic hematuria 05/17/2022   Post-menopausal 05/17/2022   Pure hypercholesterolemia 05/17/2022   Rash 05/17/2022   CKD (chronic kidney disease) stage 3, GFR 30-59 ml/min (HCC) 05/17/2022   Imbalance 02/23/2021   Encounter for competency evaluation 01/09/2021   SOB (shortness of breath) 12/06/2020   Benign paroxysmal positional vertigo 05/12/2020   Orthostatic hypotension 09/23/2019   Dizziness 09/17/2019   OAB (overactive bladder) 08/27/2019   Pain of left thumb 08/27/2019   Family history of factor V Leiden mutation 08/11/2019   Venous insufficiency 08/11/2019   Encounter for well adult exam with abnormal findings 08/08/2019   Hypothyroidism    Hypertension    Asthma    Allergic rhinitis    hx: right breast cancer 11/25/2011    Past Surgical History:  Procedure Laterality Date    BREAST LUMPECTOMY Right 1986   malignant   TONSILLECTOMY      OB History   No obstetric history on file.      Home Medications    Prior to Admission medications   Medication Sig Start Date End Date Taking? Authorizing Provider  aspirin 81 MG tablet Take 2 tablets by mouth once daily   Yes [provider]  augmented betamethasone dipropionate (DIPROLENE-AF) 0.05 % cream Apply topically. 1-2 times per day   Yes [provider]  azithromycin  (ZITHROMAX  Z-PAK) 250 MG tablet Take 500 mg on day 1 followed by 250 mg for 4 days.  Take medication for a total of 5 days. 12/15/23  Yes Reddick, Johnathan B, NP  b complex vitamins capsule Take 1 capsule by mouth daily.   Yes [provider]  Cholecalciferol (VITAMIN D3) 25 MCG (1000 UT) CAPS Take by mouth daily.   Yes [provider]  hydrALAZINE  (APRESOLINE ) 50 MG tablet TAKE 1 TABLET TWICE A DAY 06/02/23  Yes Lavona Agent, MD  loratadine (CLARITIN) 10 MG tablet Take 10 mg by mouth daily.   Yes [provider]  Magnesium 400 MG CAPS Take by mouth.   Yes [provider]  Multiple Vitamin (MULTIVITAMIN PO) Take 1 tablet by mouth daily.   Yes [provider]  Omega-3 Fatty  Acids (FISH OIL) 1000 MG CAPS Take 1 capsule by mouth daily.   Yes [provider]  SYNTHROID  100 MCG tablet TAKE 1 TABLET DAILY 08/14/23  Yes Norleen Lynwood ORN, MD  valsartan -hydrochlorothiazide  (DIOVAN -HCT) 320-25 MG tablet TAKE 1 TABLET DAILY 09/15/23  Yes Norleen Lynwood ORN, MD  Vibegron  (GEMTESA ) 75 MG TABS 1 tab by mouth once daily 07/21/23  Yes Norleen Lynwood ORN, MD    Family History Family History  Problem Relation Age of Onset   Celiac disease Sister    Heart disease Sister 42       heart attack, died 87   Arthritis Sister    COPD Sister    Hypertension Sister    Heart disease Mother        CHF   Arthritis Mother    Hypertension Mother    Heart disease Father 35       heart attack   Arthritis Father      Social History Social History   Tobacco Use   Smoking status: Former    Current packs/day: 0.00    Types: Cigarettes    Quit date: 04/18/1961    Years since quitting: 62.7   Smokeless tobacco: Never  Vaping Use   Vaping status: Never Used  Substance Use Topics   Alcohol use: Never   Drug use: Never     Allergies   Penicillins   Review of Systems Review of Systems  Constitutional: Negative.   HENT: Negative.    Respiratory: Negative.    Cardiovascular: Negative.   Gastrointestinal: Negative.   Genitourinary: Negative.   Skin:  Positive for rash.     Physical Exam Triage Vital Signs ED Triage Vitals  Encounter Vitals Group     BP 12/25/23 1402 (!) 161/75     Girls Systolic BP Percentile --      Girls Diastolic BP Percentile --      Boys Systolic BP Percentile --      Boys Diastolic BP Percentile --      Pulse Rate 12/25/23 1402 68     Resp 12/25/23 1402 16     Temp 12/25/23 1402 97.7 F (36.5 C)     Temp Source 12/25/23 1402 Oral     SpO2 12/25/23 1402 96 %     Weight --      Height --      Head Circumference --      Peak Flow --      Pain Score 12/25/23 1359 0     Pain Loc --      Pain Education --      Exclude from Growth Chart --    No data found.  Updated Vital Signs BP (!) 161/75 (BP Location: Left Arm)   Pulse 68   Temp 97.7 F (36.5 C) (Oral)   Resp 16   SpO2 96%   Visual Acuity Right Eye Distance:   Left Eye Distance:   Bilateral Distance:    Right Eye Near:   Left Eye Near:    Bilateral Near:     Physical Exam Constitutional:      Appearance: Normal appearance. She is normal weight.  Skin:    Comments: Scabbed area to the back of the left hand;  slightly tender, but no redness/warmth induration to this site;  The back of the left hand overall is slightly red;  rash up the forearm;  there is splotchy red rash slightly on the face, the back, and chest;   The  LE have 3+edema;  red rash to the tops of the mid feet to the toes;   small area of blisters to the top of the left big toe;   Neurological:     Mental Status: She is alert.      UC Treatments / Results  Labs (all labs ordered are listed, but only abnormal results are displayed) Labs Reviewed - No data to display  EKG   Radiology No results found.  Procedures Procedures (including critical care time)  Medications Ordered in UC Medications - No data to display  Initial Impression / Assessment and Plan / UC Course  I have reviewed the triage vital signs and the nursing notes.  Pertinent labs & imaging results that were available during my care of the patient were reviewed by me and considered in my medical decision making (see chart for details).  Patient is seen today for rash.  Unclear if etiology is due to recent abx use or not.  Will check lab work.  Decadron  today, and steroid for next 5 days.  Warnings given for return or go to the ER.   Final Clinical Impressions(s) / UC Diagnoses   Final diagnoses:  Rash     Discharge Instructions      You were seen today for a rash.  At this time I think this may be due to recent antibiotic use.  I have ordered blood work for work up.  I have given you a shot of a steroid while here today as well, and sent out an oral steroid to start tomorrow.  Please keep your feel elevated as much as you can to help with swelling.  If your rash is not improving, or is worsening over the next 24 hrs, then please go to the ER for further evaluation.     ED Prescriptions     Medication Sig Dispense Auth. Provider   predniSONE  (DELTASONE ) 20 MG tablet Take 2 tablets (40 mg total) by mouth daily for 5 days. 10 tablet Darral Longs, MD      PDMP not reviewed this encounter.   Darral Longs, MD 12/25/23 1450

## 2023-12-26 ENCOUNTER — Ambulatory Visit (HOSPITAL_COMMUNITY): Payer: Self-pay

## 2024-01-09 ENCOUNTER — Other Ambulatory Visit: Payer: Self-pay | Admitting: Internal Medicine

## 2024-01-09 MED ORDER — GEMTESA 75 MG PO TABS: ORAL_TABLET | ORAL | 3 refills | Status: DC

## 2024-01-15 ENCOUNTER — Other Ambulatory Visit: Payer: Self-pay | Admitting: Cardiology

## 2024-03-01 ENCOUNTER — Ambulatory Visit: Payer: Self-pay | Admitting: *Deleted

## 2024-03-01 NOTE — Telephone Encounter (Signed)
 FYI Only or Action Required?: FYI only for provider: appointment scheduled on 11/17.  Patient was last seen in primary care on 07/21/2023 by Norleen Lynwood ORN, MD.  Called Nurse Triage reporting Dizziness.  Symptoms began a week ago.  Interventions attempted: Rest, hydration, or home remedies.  Symptoms are: gradually worsening.  Triage Disposition: See Physician Within 24 Hours  Patient/caregiver understands and will follow disposition?: Yes  Patient has hx vertigo and report she is having same symptoms again. Patient has been scheduled for appointment- advised UC/ED if she has additional symptoms/worsening symptoms.  Copied from CRM #[id number]. Topic: Clinical - Red Word Triage >> Mar 01, 2024  2:58 PM Alfonso HERO wrote: Red Word that prompted transfer to Nurse Triage: Vertigo Reason for Disposition  [1] NO dizziness now AND [2] age > 37  Answer Assessment - Initial Assessment Questions 1. DESCRIPTION: Describe your dizziness.     Chronic problem- gotten really bad 2. VERTIGO: Do you feel like either you or the room is spinning or tilting?      Feels head pressure all the time, feels like everything moves when she turns over 3. LIGHTHEADED: Do you feel lightheaded? (e.g., somewhat faint, woozy, weak upon standing)     Yes- using walker for support 4. SEVERITY: How bad is it?  Can you walk?     Lately gotten worse 5. ONSET:  When did the dizziness begin?     1 week 6. AGGRAVATING FACTORS: Does anything make it worse? (e.g., standing, change in head position)     Turning over in the bed 7. CAUSE: What do you think is causing the dizziness?     vertigo 8. RECURRENT SYMPTOM: Have you had dizziness before? If Yes, ask: When was the last time? What happened that time?     Yes- hx vertigo diagnosed 4 years ago 9. OTHER SYMPTOMS: Do you have any other symptoms? (e.g., earache, headache, numbness, tinnitus, vomiting, weakness)     no  Protocols used: Dizziness -  Vertigo-A-AH

## 2024-03-04 ENCOUNTER — Ambulatory Visit (INDEPENDENT_AMBULATORY_CARE_PROVIDER_SITE_OTHER): Admitting: Family Medicine

## 2024-03-04 ENCOUNTER — Encounter: Payer: Self-pay | Admitting: Family Medicine

## 2024-03-04 VITALS — BP 140/68 | HR 75 | Temp 98.1°F | Resp 18 | Ht 68.0 in | Wt 173.0 lb

## 2024-03-04 DIAGNOSIS — H8112 Benign paroxysmal vertigo, left ear: Secondary | ICD-10-CM | POA: Diagnosis not present

## 2024-03-04 NOTE — Assessment & Plan Note (Signed)
 Chronic vertigo worsened recently. Previous evaluations ruled out other causes. Meclizine  ineffective and may hinder vestibular rehabilitation. - Referred to physical therapy for vestibular rehabilitation. - Advised to avoid meclizine .  Orders:   Ambulatory referral to Physical Therapy

## 2024-03-04 NOTE — Progress Notes (Signed)
 Assessment & Plan Benign paroxysmal positional vertigo of left ear Chronic vertigo worsened recently. Previous evaluations ruled out other causes. Meclizine  ineffective and may hinder vestibular rehabilitation. - Referred to physical therapy for vestibular rehabilitation. - Advised to avoid meclizine .  Orders:   Ambulatory referral to Physical Therapy   Follow up plan: Return if symptoms worsen or fail to improve.  Niki Rung, MSN, APRN, FNP-C  Subjective:  HPI: Julia Norton is a 88 y.o. female presenting on 03/04/2024 for Dizziness (Vertigo episodes started about 4 years ago - Hx of vertigo: has had a cardio, vascular, ENT, eyes and neuro work up/Often since has a foggy head, vertigo is always there, but some days are worse than others. Edwinna a cane normally, today has a walker. )  Discussed the use of AI scribe software for clinical note transcription with the patient, who gave verbal consent to proceed.  Her vertigo symptoms began four years ago and have recently worsened. She experiences a sensation of everything moving when she rolls over, particularly when lying on her left side, leading her to avoid sleeping on that side. The current episode started over a week ago, and she has been using a walker for support due to the severity of her symptoms.  She recalls an initial episode on Mother's Day four years ago, where she experienced severe vertigo upon sitting up from a lying position. At that time, she underwent an extensive workup including cardiology, vascular, ENT, ophthalmology, and neurology consultations, as well as a brain and neck MRI. Despite these evaluations, her symptoms have persisted, with some days being worse than others.  She has previously been prescribed meclizine  for her vertigo, but it did not provide relief. She also mentions a past experience, approximately twenty years ago, where vestibular rehabilitation helped alleviate her symptoms.  Her vertigo  symptoms have impacted her mobility, necessitating the use of a cane and now a walker, especially due to a bad knee that sometimes gives way.  No current blurred vision. Vertigo is particularly worse when rolling over, especially on her left side.       ROS: Negative unless specifically indicated above in HPI.   Relevant past medical history reviewed and updated as indicated.   Allergies and medications reviewed and updated.   Current Outpatient Medications:    aspirin 81 MG tablet, Take 2 tablets by mouth once daily, Disp: , Rfl:    b complex vitamins capsule, Take 1 capsule by mouth daily., Disp: , Rfl:    Cholecalciferol (VITAMIN D3) 25 MCG (1000 UT) CAPS, Take by mouth daily., Disp: , Rfl:    hydrALAZINE  (APRESOLINE ) 50 MG tablet, TAKE 1 TABLET TWICE A DAY, Disp: 60 tablet, Rfl: 0   loratadine (CLARITIN) 10 MG tablet, Take 10 mg by mouth daily., Disp: , Rfl:    Magnesium 400 MG CAPS, Take by mouth., Disp: , Rfl:    Multiple Vitamin (MULTIVITAMIN PO), Take 1 tablet by mouth daily., Disp: , Rfl:    Omega-3 Fatty Acids (FISH OIL) 1000 MG CAPS, Take 1 capsule by mouth daily., Disp: , Rfl:    SYNTHROID  100 MCG tablet, TAKE 1 TABLET DAILY, Disp: 90 tablet, Rfl: 3   valsartan -hydrochlorothiazide  (DIOVAN -HCT) 320-25 MG tablet, TAKE 1 TABLET DAILY, Disp: 90 tablet, Rfl: 1   Vibegron  (GEMTESA ) 75 MG TABS, 1 tab by mouth once daily, Disp: 90 tablet, Rfl: 3  Allergies  Allergen Reactions   Penicillins     Does not remember the reaction to medication.  Objective:   BP (!) 140/68   Pulse 75   Temp 98.1 F (36.7 C)   Resp 18   Ht 5' 8 (1.727 m)   Wt 173 lb (78.5 kg)   SpO2 95%   BMI 26.30 kg/m    Physical Exam Vitals reviewed.  Constitutional:      General: She is not in acute distress.    Appearance: Normal appearance. She is not ill-appearing, toxic-appearing or diaphoretic.  HENT:     Head: Normocephalic and atraumatic.     Right Ear: Tympanic membrane, ear canal and  external ear normal. There is no impacted cerumen.     Left Ear: Tympanic membrane, ear canal and external ear normal. There is no impacted cerumen.  Eyes:     General: No scleral icterus.       Right eye: No discharge.        Left eye: No discharge.     Conjunctiva/sclera: Conjunctivae normal.  Neck:     Vascular: No carotid bruit.  Cardiovascular:     Rate and Rhythm: Normal rate and regular rhythm.     Heart sounds: Normal heart sounds. No murmur heard.    No friction rub. No gallop.  Pulmonary:     Effort: Pulmonary effort is normal. No respiratory distress.     Breath sounds: Normal breath sounds. No stridor. No wheezing, rhonchi or rales.  Musculoskeletal:        General: Normal range of motion.     Cervical back: Normal range of motion.  Skin:    General: Skin is warm and dry.     Capillary Refill: Capillary refill takes less than 2 seconds.  Neurological:     General: No focal deficit present.     Mental Status: She is alert and oriented to person, place, and time. Mental status is at baseline.     Motor: Motor function is intact.     Coordination: Coordination is intact.     Gait: Gait abnormal (walking with walker).  Psychiatric:        Mood and Affect: Mood normal.        Behavior: Behavior normal.        Thought Content: Thought content normal.        Judgment: Judgment normal.

## 2024-03-12 ENCOUNTER — Ambulatory Visit: Attending: Family Medicine | Admitting: Physical Therapy

## 2024-03-12 VITALS — BP 192/89 | HR 69

## 2024-03-12 DIAGNOSIS — R262 Difficulty in walking, not elsewhere classified: Secondary | ICD-10-CM | POA: Insufficient documentation

## 2024-03-12 DIAGNOSIS — M6281 Muscle weakness (generalized): Secondary | ICD-10-CM | POA: Insufficient documentation

## 2024-03-12 DIAGNOSIS — R42 Dizziness and giddiness: Secondary | ICD-10-CM | POA: Insufficient documentation

## 2024-03-12 DIAGNOSIS — H8112 Benign paroxysmal vertigo, left ear: Secondary | ICD-10-CM | POA: Insufficient documentation

## 2024-03-12 DIAGNOSIS — R2681 Unsteadiness on feet: Secondary | ICD-10-CM | POA: Insufficient documentation

## 2024-03-12 NOTE — Therapy (Signed)
 OUTPATIENT PHYSICAL THERAPY VESTIBULAR EVALUATION     Patient Name: Julia Norton MRN: [id number] DOB:02/17/1936, 88 y.o., female Today's Date: 03/12/2024  END OF SESSION:  PT End of Session - 03/12/24 1250     Visit Number 1    Number of Visits 9    Date for Recertification  04/11/24    Authorization Type [employer] MEDICARE    PT Start Time 0851    PT Stop Time 0932    PT Time Calculation (min) 41 min    Activity Tolerance Patient tolerated treatment well    Behavior During Therapy [facility] for tasks assessed/performed          Past Medical History:  Diagnosis Date   Allergic rhinitis    Asthma    Breast cancer (HCC) 1987   Erysipelas    Hemorrhoids    Hypertension    Hypothyroidism    Past Surgical History:  Procedure Laterality Date   BREAST LUMPECTOMY Right 1986   malignant   TONSILLECTOMY     Patient Active Problem List   Diagnosis Date Noted   Gait disorder 01/20/2023   Bruit 05/23/2022   Chronic constipation 05/17/2022   Chronic venous stasis 05/17/2022   Microscopic hematuria 05/17/2022   Post-menopausal 05/17/2022   Pure hypercholesterolemia 05/17/2022   Rash 05/17/2022   CKD (chronic kidney disease) stage 3, GFR 30-59 ml/min (HCC) 05/17/2022   Imbalance 02/23/2021   Encounter for competency evaluation 01/09/2021   SOB (shortness of breath) 12/06/2020   Benign paroxysmal positional vertigo 05/12/2020   Orthostatic hypotension 09/23/2019   Dizziness 09/17/2019   OAB (overactive bladder) 08/27/2019   Pain of left thumb 08/27/2019   Family history of factor V Leiden mutation 08/11/2019   Venous insufficiency 08/11/2019   Encounter for well adult exam with abnormal findings 08/08/2019   Hypothyroidism    Hypertension    Asthma    Allergic rhinitis    hx: right breast cancer 11/25/2011    PCP: Norleen Lynwood ORN, MD REFERRING PROVIDER: Merlynn Niki FALCON, FNP  REFERRING DIAG: [phone] (ICD-10-CM) - Benign paroxysmal positional vertigo of left ear    THERAPY DIAG:  Dizziness and giddiness  Unsteadiness on feet  Difficulty in walking, not elsewhere classified  Muscle weakness (generalized)  ONSET DATE: 03/04/2024  Rationale for Evaluation and Treatment: Rehabilitation  SUBJECTIVE:   SUBJECTIVE STATEMENT: Patient ambulates into clinic with FWRW. Pt states that on May 2021 was the first episode 4 years ago after laying on the bed in a weird position. They thought it was a stroke and she did a lot of tests but it wasn't a stroke and they couldn't find any issue. She feels that it has gotten worse, like 2 weeks ago the dizziness has gotten worse so she started using her walker. She can't trust that she won't fall. Hasn't had any falls but stumbles into walls and furniture a lot. She drove here and drives usually. When she stands up and walks she gets more dizzy, when she turns her head or looks down and when she turns over in bed she gets dizzy. She doesn't turn over on her right side. She has seat in shower and uses shower seat to wash her feet because taking a shower makes her dizzy. Only when she turns over in bed the room is kind of spinning. She didn't take BP medication today but she states she usually does. Current dizziness is 2.5/5, no headaches.  Pt accompanied by: self  PERTINENT HISTORY: Patient is an 88  YO female who has a PMH of BPPV, dizziness, orthostatic hypotension, gait disorder, imbalance, SOB, hypertension and hypothyroidism.   Vertigo episodes started about 4 years ago - Hx of vertigo: has had a cardio, vascular, ENT, eyes and neuro work up/Often since has a foggy head, vertigo is always there, but some days are worse than others. Edwinna a cane normally, today has a walker.  PAIN:  Are you having pain? No  PRECAUTIONS: Knee and Other: Knee gives out some times/buckles  FALLS: Has patient fallen in last 6 months? No falls, near falls with using wall and furniture to recover  LIVING ENVIRONMENT: Lives with:  lives with their spouse Lives in: House/apartment Stairs: 2 story home, doesn't go upstairs but goes to basement but uses back entrance of 4 steps dwn and 3 step down to go into basement.  Has following equipment at home: Single point cane and Walker - 2 wheeled  PLOF: started using walker 2 weeks ago  PATIENT GOALS: Not be dizzy, legs get stronger, and needs to see orthopedic because her left knee hurts and right knee wants to collapse on her.   OBJECTIVE:  Note: Objective measures were completed at Evaluation unless otherwise noted.  DIAGNOSTIC FINDINGS:  05/24/2020: MR BRAIN W WO CONTRAST:  IMPRESSION: This MRI of the brain with and without contrast shows the following: 1.   Scattered T2/FLAIR hyperintense foci in the periventricular and deep consistent with mild chronic microvascular ischemic change, essentially unchanged compared to the 12/03/2008 MRI. 2.   Dural based homogenously enhancing mass involving the posterior falx cerebri and directed to the left consistent with a meningioma.  It is not causing any mass-effect.  It is unchanged in size compared to the 10/04/2019 CT scan but increase in size compared to the 12/03/2008 MRI. 3.   No acute findings.  With exception of the meningioma, there is a normal enhancement pattern.  10/04/2019: CT HEAD WO CONTRAST: IMPRESSION: 1.  No acute findings.   2.  Minimal chronic ischemic microvascular disease.  COGNITION: Overall cognitive status: Within functional limits for tasks assessed  POSTURE:  rounded shoulders, forward head, and flexed trunk   Cervical ROM:    Right and left rotation WFL, no pain  TRANSFERS: Sit to stand: SBA and patient becomes more dizzy with standing  GAIT: Gait pattern: decreased stride length, decreased trunk rotation, and trunk flexed Distance walked: Clinic distance Assistive device utilized: Environmental Consultant - 2 wheeled Level of assistance: SBA Comments: Unsteady gait, pt uses FWRW due to feeling more  unsteady than usual in the last 2 weeks  VESTIBULAR ASSESSMENT:  GENERAL OBSERVATION: Patient ambulates into clinic with front wheeled rolling walker. Drove to clinic herself.   SYMPTOM BEHAVIOR:  Subjective history:  Non-Vestibular symptoms: when she stands up it gets worse  Type of dizziness: Funny feeling in the head and Swimmyheaded  Frequency: all the time but intensity changes  Duration: rolling over in bed dizziness goes away in about 1  Aggravating factors: Induced by position change: rolling to the right, rolling to the left, supine to sit, and sit to stand and Induced by motion: looking up at the ceiling, bending down to the ground, turning body quickly, turning head quickly, driving, and activity in general  Relieving factors: closing eyes and slow movements  Progression of symptoms: worse  OCULOMOTOR EXAM:  Ocular Alignment: normal  Ocular ROM: No Limitations  Spontaneous Nystagmus: absent  Gaze-Induced Nystagmus: age appropriate nystagmus at end range and (with upper left specifically)  Smooth  Pursuits: intact and appropriate nystagmus L upper at end range  Saccades: intact and slow  Convergence/Divergence: about 5 cm   VESTIBULAR - OCULAR REFLEX:   Slow VOR: Normal  VOR Cancellation: Normal  Head-Impulse Test: HIT Right: unable to determine due to neck guarding HIT Left: unable to determine due to neck guarding   POSITIONAL TESTING: TBA  MOTION SENSITIVITY:  Motion Sensitivity Quotient Intensity: 0 = none, 1 = Lightheaded, 2 = Mild, 3 = Moderate, 4 = Severe, 5 = Vomiting  Intensity  1. Sitting to supine   2. Supine to L side   3. Supine to R side   4. Supine to sitting   5. L Hallpike-Dix   6. Up from L    7. R Hallpike-Dix   8. Up from R    9. Sitting, head tipped to L knee   10. Head up from L knee   11. Sitting, head tipped to R knee   12. Head up from R knee   13. Sitting head turns x5   14.Sitting head nods x5   15. In stance, 180 turn to L     16. In stance, 180 turn to R                                                                                                                       TREATMENT DATE: 03/12/2024  Vitals:   03/12/24 0917 03/12/24 0918  BP: (!) 176/84 (!) 192/89  Pulse: 64 69    Self-care: Patient BP assessed today both in sitting and standing and within normal limits for evaluation today but is elevated. Patient education on need to consistently take blood pressure medication as her BP is high, especially in standing. Patient education on findings such as normal oculomotor exam but inconclusive findings with HIT due to neck muscle guarding. Education on need to assess canals through different positions to determine if BPPV is present or not. Discussed plan of care and need for continued care for treatment of BPPV if present in next session and assessment of balance one BPPV is treated if present. She states that when she stands up and walks she gets more dizzy. She also gets dizzy when she turns her head or looks down and when she turns over in bed and doesn't like to turn over on her right side.  PATIENT EDUCATION: Education details: See above Person educated: Patient Education method: Explanation Education comprehension: verbalized understanding and needs further education  HOME EXERCISE PROGRAM:  GOALS: Goals reviewed with patient? Yes  SHORT TERM GOALS: Target date: 04/11/2024  Pt will be independent with final HEP for improved symptoms and balance. Baseline: to be provided Goal status: INITIAL  Patient will demonstrate (-) positional testing to indicate resolution of BPPV. Baseline: TBA Goal status: INITIAL  3.  MSQ to be assessed with LTG written. Baseline: TBA Goal status: INITIAL   4.  mCTSIB to be assessed with LTG written.  Baseline: TBA Goal status: INITIAL  ASSESSMENT:  CLINICAL IMPRESSION: Patient is an 88 y.o. female who was seen today for physical therapy evaluation and  treatment for BPPV. Patient has a PMH of BPPV, dizziness, orthostatic hypotension, gait disorder, imbalance, SOB, hypertension and hypothyroidism. Patient states that she started feeling dizzy about 4 years ago when sitting on the bed in a strange position and when she got up she noticed it. When she stands up and walks she gets more dizzy, when she turns her head or looks down and when she turns over in bed she gets dizzy. She states she doesn't turn over on her right side but she only has room spinning when she rolls over in bed. During assessment today the oculomotor exam was normal with age related changes of nystagmus at end range of smooth pursuits, especially of the upper left quadrant. Next session will continue with assessment of canals to determine if BPPV is present. Patient displays unsteady gait and low confidence with fear of falling during ambulation and should be assessed accordingly. Pt continues to benefit from skilled PT to address these impairments and functional limitations to maximize functional mobility independence and decrease dizziness.  OBJECTIVE IMPAIRMENTS: Abnormal gait, decreased balance, difficulty walking, dizziness, and postural dysfunction.   ACTIVITY LIMITATIONS: carrying, lifting, bending, standing, squatting, sleeping, stairs, bed mobility, bathing, dressing, and hygiene/grooming  PARTICIPATION LIMITATIONS: meal prep, cleaning, laundry, driving, and shopping  PERSONAL FACTORS: Age, Past/current experiences, Sex, Time since onset of injury/illness/exacerbation, and 1-2 comorbidities:  BPPV, dizziness, orthostatic hypotension, gait disorder, imbalance, SOB, hypertension and hypothyroidism are also affecting patient's functional outcome.   REHAB POTENTIAL: Good  CLINICAL DECISION MAKING: Evolving/moderate complexity  EVALUATION COMPLEXITY: Moderate   PLAN:  PT FREQUENCY: 2x/week  PT DURATION: 4 weeks  PLANNED INTERVENTIONS: 97164- PT Re-evaluation,  97110-Therapeutic exercises, 97530- Therapeutic activity, 97112- Neuromuscular re-education, 97535- Self Care, [id number]- Manual therapy, [phone]- Gait training, [phone]- Canalith repositioning, Patient/Family education, Balance training, Stair training, Vestibular training, Visual/preceptual remediation/compensation, Cognitive remediation, and DME instructions  PLAN FOR NEXT SESSION: Check canals, start with R posterior as she gets symptoms when rolling to her right in bed   [employer], Student-PT 03/12/2024, 12:52 PM

## 2024-03-18 ENCOUNTER — Ambulatory Visit: Attending: Family Medicine

## 2024-03-18 VITALS — BP 167/72 | HR 78

## 2024-03-18 DIAGNOSIS — R42 Dizziness and giddiness: Secondary | ICD-10-CM | POA: Diagnosis present

## 2024-03-18 DIAGNOSIS — R2681 Unsteadiness on feet: Secondary | ICD-10-CM | POA: Diagnosis present

## 2024-03-18 DIAGNOSIS — R262 Difficulty in walking, not elsewhere classified: Secondary | ICD-10-CM | POA: Diagnosis present

## 2024-03-18 DIAGNOSIS — M6281 Muscle weakness (generalized): Secondary | ICD-10-CM | POA: Diagnosis present

## 2024-03-18 NOTE — Therapy (Incomplete)
 OUTPATIENT PHYSICAL THERAPY VESTIBULAR TREATMENT     Patient Name: Julia Norton MRN: [id number] DOB:[birth year], 88 y.o., female Today's Date: 03/18/2024  END OF SESSION:  PT End of Session - 03/18/24 1635     Visit Number 2    Number of Visits 9    Date for Recertification  04/11/24    Authorization Type [employer] MEDICARE    PT Start Time 1536    PT Stop Time 1616    PT Time Calculation (min) 40 min    Activity Tolerance Patient tolerated treatment well    Behavior During Therapy WFL for tasks assessed/performed           Past Medical History:  Diagnosis Date   Allergic rhinitis    Asthma    Breast cancer (HCC) 1987   Erysipelas    Hemorrhoids    Hypertension    Hypothyroidism    Past Surgical History:  Procedure Laterality Date   BREAST LUMPECTOMY Right 1986   malignant   TONSILLECTOMY     Patient Active Problem List   Diagnosis Date Noted   Gait disorder 01/20/2023   Bruit 05/23/2022   Chronic constipation 05/17/2022   Chronic venous stasis 05/17/2022   Microscopic hematuria 05/17/2022   Post-menopausal 05/17/2022   Pure hypercholesterolemia 05/17/2022   Rash 05/17/2022   CKD (chronic kidney disease) stage 3, GFR 30-59 ml/min (HCC) 05/17/2022   Imbalance 02/23/2021   Encounter for competency evaluation 01/09/2021   SOB (shortness of breath) 12/06/2020   Benign paroxysmal positional vertigo 05/12/2020   Orthostatic hypotension 09/23/2019   Dizziness 09/17/2019   OAB (overactive bladder) 08/27/2019   Pain of left thumb 08/27/2019   Family history of factor V Leiden mutation 08/11/2019   Venous insufficiency 08/11/2019   Encounter for well adult exam with abnormal findings 08/08/2019   Hypothyroidism    Hypertension    Asthma    Allergic rhinitis    hx: right breast cancer 11/25/2011    PCP: Norleen Lynwood ORN, MD REFERRING PROVIDER: Merlynn Niki FALCON, FNP  REFERRING DIAG: [phone] (ICD-10-CM) - Benign paroxysmal positional vertigo of left ear    THERAPY DIAG:  Dizziness and giddiness  Unsteadiness on feet  Difficulty in walking, not elsewhere classified  Muscle weakness (generalized)  ONSET DATE: 03/04/2024  Rationale for Evaluation and Treatment: Rehabilitation  SUBJECTIVE:   SUBJECTIVE STATEMENT: Patient ambulates into clinic with FWRW. Patient states she still has dizziness when bending over and getting up. She avoids turning to the right in bed. Patient denies falls. Current dizziness: 3.5/5  Pt accompanied by: self  PERTINENT HISTORY: Patient is an 88 YO female who has a PMH of BPPV, dizziness, orthostatic hypotension, gait disorder, imbalance, SOB, hypertension and hypothyroidism.   Vertigo episodes started about 4 years ago - Hx of vertigo: has had a cardio, vascular, ENT, eyes and neuro work up/Often since has a foggy head, vertigo is always there, but some days are worse than others. Edwinna a cane normally, today has a walker.  PAIN:  Are you having pain? No  PRECAUTIONS: Knee and Other: Knee gives out some times/buckles  FALLS: Has patient fallen in last 6 months? No falls, near falls with using wall and furniture to recover  LIVING ENVIRONMENT: Lives with: lives with their spouse Lives in: House/apartment Stairs: 2 story home, doesn't go upstairs but goes to basement but uses back entrance of 4 steps dwn and 3 step down to go into basement.  Has following equipment at home: Single point cane and  Walker - 2 wheeled  PLOF: started using walker 2 weeks ago  PATIENT GOALS: Not be dizzy, legs get stronger, and needs to see orthopedic because her left knee hurts and right knee wants to collapse on her.   OBJECTIVE:  Note: Objective measures were completed at Evaluation unless otherwise noted.  DIAGNOSTIC FINDINGS:  05/24/2020: MR BRAIN W WO CONTRAST:  IMPRESSION: This MRI of the brain with and without contrast shows the following: 1.   Scattered T2/FLAIR hyperintense foci in the periventricular  and deep consistent with mild chronic microvascular ischemic change, essentially unchanged compared to the 12/03/2008 MRI. 2.   Dural based homogenously enhancing mass involving the posterior falx cerebri and directed to the left consistent with a meningioma.  It is not causing any mass-effect.  It is unchanged in size compared to the 10/04/2019 CT scan but increase in size compared to the 12/03/2008 MRI. 3.   No acute findings.  With exception of the meningioma, there is a normal enhancement pattern.  10/04/2019: CT HEAD WO CONTRAST: IMPRESSION: 1.  No acute findings.   2.  Minimal chronic ischemic microvascular disease.  COGNITION: Overall cognitive status: Within functional limits for tasks assessed  POSTURE:  rounded shoulders, forward head, and flexed trunk   Cervical ROM:    Right and left rotation WFL, no pain  TRANSFERS: Sit to stand: SBA and patient becomes more dizzy with standing  GAIT: Gait pattern: decreased stride length, decreased trunk rotation, and trunk flexed Distance walked: Clinic distance Assistive device utilized: Environmental Consultant - 2 wheeled Level of assistance: SBA Comments: Unsteady gait, pt uses FWRW due to feeling more unsteady than usual in the last 2 weeks  VESTIBULAR ASSESSMENT:  GENERAL OBSERVATION: Patient ambulates into clinic with front wheeled rolling walker. Drove to clinic herself.   SYMPTOM BEHAVIOR:  Subjective history:  Non-Vestibular symptoms: when she stands up it gets worse  Type of dizziness: Funny feeling in the head and Swimmyheaded  Frequency: all the time but intensity changes  Duration: rolling over in bed dizziness goes away in about 1  Aggravating factors: Induced by position change: rolling to the right, rolling to the left, supine to sit, and sit to stand and Induced by motion: looking up at the ceiling, bending down to the ground, turning body quickly, turning head quickly, driving, and activity in general  Relieving factors:  closing eyes and slow movements  Progression of symptoms: worse  OCULOMOTOR EXAM:  Ocular Alignment: normal  Ocular ROM: No Limitations  Spontaneous Nystagmus: absent  Gaze-Induced Nystagmus: age appropriate nystagmus at end range and (with upper left specifically)  Smooth Pursuits: intact and appropriate nystagmus L upper at end range  Saccades: intact and slow  Convergence/Divergence: about 5 cm   VESTIBULAR - OCULAR REFLEX:   Slow VOR: Normal  VOR Cancellation: Normal  Head-Impulse Test: HIT Right: unable to determine due to neck guarding HIT Left: unable to determine due to neck guarding   POSITIONAL TESTING: TBA  MOTION SENSITIVITY:  Motion Sensitivity Quotient Intensity: 0 = none, 1 = Lightheaded, 2 = Mild, 3 = Moderate, 4 = Severe, 5 = Vomiting  Intensity, 03/18/24  1. Sitting to supine   2. Supine to L side   3. Supine to R side   4. Supine to sitting   5. L Hallpike-Dix   6. Up from L    7. R Hallpike-Dix 4  8. Up from R  3  9. Sitting, head tipped to L knee   10. Head up  from L knee   11. Sitting, head tipped to R knee   12. Head up from R knee   13. Sitting head turns x5   14.Sitting head nods x5   15. In stance, 180 turn to L    16. In stance, 180 turn to R                                                                                                                       TREATMENT DATE: 03/12/2024  Self-care: Vitals:   03/18/24 1543 03/18/24 1544  BP: (!) 147/66 (!) 167/72  Pulse: 74 78   Education provided to patient on findings during treatment today. Patient educated on proper sleeping position tonight and to avoid excessive head movements such as having to bend over to get clothes out of the dryer. Pt educated to sleep with head slightly elevated with pillows. Patient verbally understood but should continue education during next session.   NMR:  R sidelying, positive for nystagmus and room spinning, fatigued R Dix hallpike x 2 reps 1st rep: pt  positive for R posterior canalithiasis BPPV, nystagmus noted but unable to determine direction due to brevity and unable to determine a pattern, fatigued in about 3-5 seconds 2nd rep: no nystagmus, no symptoms on R, patient was symptomatic of head spinning but not room spinning when sitting up 3rd rep: no nystagmus, no symptoms on R, patient stated they didn't feel the head spinning this time   Canalith repositioning: Canalith repositioning: R Epley x 1 reps Pt positive for R posterior canalithiasis BPPV, nystagmus noted but unable to determine direction due to brevity and unable to determine a pattern, fatigued in about 3-5 seconds Pt reported room spinning with 2nd position (head turned to left) but no nystagmus noted, room spinning with 3rd position (pt on left side) but no nystagmus noted   *Minimal neck extension over pillow used for all positions below, all tolerated well, no signs or symptoms associated with VBI, VBI screening prior to treatment today was negative for any signs or symptoms of lightheadedness, dysarthria, and vision changes. Pt reported feeling good and normal and wanted to proceed.  PATIENT EDUCATION: Education details: See above Person educated: Patient Education method: Explanation Education comprehension: verbalized understanding and needs further education  HOME EXERCISE PROGRAM:  GOALS: Goals reviewed with patient? Yes  SHORT TERM GOALS: Target date: 04/11/2024  Pt will be independent with final HEP for improved symptoms and balance. Baseline: to be provided Goal status: INITIAL  Patient will demonstrate (-) positional testing to indicate resolution of BPPV. Baseline: positive R side lying, need to continue  Goal status: INITIAL  3.  MSQ to be assessed with LTG written. Baseline: TBA Goal status: INITIAL   4.  mCTSIB to be assessed with LTG written.  Baseline: TBA Goal status: INITIAL  ASSESSMENT:  CLINICAL IMPRESSION: Patient presents to  skilled physical therapy today with focus on R posterior canalithiasis repositioning and education. Treatment was well tolerated today with 1  rep of repositioning. The first dixhall pike and Epley maneuver was positive for R posterior canalithiasis BPPV, nystagmus noted but unable to determine direction due to brevity and unable to determine a pattern, fatigued in about 3-5 seconds. Pt reported room spinning with 2nd position (head turned to left) but no nystagmus noted, room spinning with 3rd position (pt on left side) but no nystagmus noted. With the second dix hallpike there was no nystagmus, no symptoms on R, patient was symptomatic of head spinning but not room spinning when sitting up. Dix hallpike was tested for a 3rd time with no nystagmus, no symptoms on R, patient stated they didn't feel the head spinning this time. Education on findings today and proper sleep positioning. All treatment tolerated well, no signs or symptoms associated with VBI. Pt continues to benefit from skilled PT to address these impairments and functional limitations to maximize functional mobility independence and decr dizziness.  OBJECTIVE IMPAIRMENTS: Abnormal gait, decreased balance, difficulty walking, dizziness, and postural dysfunction.   ACTIVITY LIMITATIONS: carrying, lifting, bending, standing, squatting, sleeping, stairs, bed mobility, bathing, dressing, and hygiene/grooming  PARTICIPATION LIMITATIONS: meal prep, cleaning, laundry, driving, and shopping  PERSONAL FACTORS: Age, Past/current experiences, Sex, Time since onset of injury/illness/exacerbation, and 1-2 comorbidities:  BPPV, dizziness, orthostatic hypotension, gait disorder, imbalance, SOB, hypertension and hypothyroidism are also affecting patient's functional outcome.   REHAB POTENTIAL: Good  CLINICAL DECISION MAKING: Evolving/moderate complexity  EVALUATION COMPLEXITY: Moderate   PLAN:  PT FREQUENCY: 2x/week  PT DURATION: 4  weeks  PLANNED INTERVENTIONS: 97164- PT Re-evaluation, 97110-Therapeutic exercises, 97530- Therapeutic activity, 97112- Neuromuscular re-education, 97535- Self Care, 02859- Manual therapy, [phone]- Gait training, [phone]- Canalith repositioning, Patient/Family education, Balance training, Stair training, Vestibular training, Visual/preceptual remediation/compensation, Cognitive remediation, and DME instructions  PLAN FOR NEXT SESSION: Check R posterior    Emmalene Sherry, Student-PT 03/18/2024, 5:17 PM

## 2024-03-21 ENCOUNTER — Ambulatory Visit

## 2024-03-21 VITALS — BP 125/64 | HR 77

## 2024-03-21 DIAGNOSIS — R42 Dizziness and giddiness: Secondary | ICD-10-CM

## 2024-03-21 DIAGNOSIS — R262 Difficulty in walking, not elsewhere classified: Secondary | ICD-10-CM

## 2024-03-21 DIAGNOSIS — M6281 Muscle weakness (generalized): Secondary | ICD-10-CM

## 2024-03-21 DIAGNOSIS — R2681 Unsteadiness on feet: Secondary | ICD-10-CM

## 2024-03-21 NOTE — Therapy (Signed)
 OUTPATIENT PHYSICAL THERAPY VESTIBULAR TREATMENT     Patient Name: Julia Norton MRN: [id number] DOB:1935-11-17, 88 y.o., female Today's Date: 03/21/2024  END OF SESSION:  PT End of Session - 03/21/24 1419     Visit Number 3    Number of Visits 9    Date for Recertification  04/11/24    Authorization Type [employer] MEDICARE    PT Start Time 1317    PT Stop Time 1356    PT Time Calculation (min) 39 min    Activity Tolerance Patient tolerated treatment well    Behavior During Therapy WFL for tasks assessed/performed            Past Medical History:  Diagnosis Date   Allergic rhinitis    Asthma    Breast cancer (HCC) 1987   Erysipelas    Hemorrhoids    Hypertension    Hypothyroidism    Past Surgical History:  Procedure Laterality Date   BREAST LUMPECTOMY Right 1986   malignant   TONSILLECTOMY     Patient Active Problem List   Diagnosis Date Noted   Gait disorder 01/20/2023   Bruit 05/23/2022   Chronic constipation 05/17/2022   Chronic venous stasis 05/17/2022   Microscopic hematuria 05/17/2022   Post-menopausal 05/17/2022   Pure hypercholesterolemia 05/17/2022   Rash 05/17/2022   CKD (chronic kidney disease) stage 3, GFR 30-59 ml/min (HCC) 05/17/2022   Imbalance 02/23/2021   Encounter for competency evaluation 01/09/2021   SOB (shortness of breath) 12/06/2020   Benign paroxysmal positional vertigo 05/12/2020   Orthostatic hypotension 09/23/2019   Dizziness 09/17/2019   OAB (overactive bladder) 08/27/2019   Pain of left thumb 08/27/2019   Family history of factor V Leiden mutation 08/11/2019   Venous insufficiency 08/11/2019   Encounter for well adult exam with abnormal findings 08/08/2019   Hypothyroidism    Hypertension    Asthma    Allergic rhinitis    hx: right breast cancer 11/25/2011    PCP: Norleen Lynwood ORN, MD REFERRING PROVIDER: Merlynn Niki FALCON, FNP  REFERRING DIAG: [phone] (ICD-10-CM) - Benign paroxysmal positional vertigo of left ear    THERAPY DIAG:  Dizziness and giddiness  Unsteadiness on feet  Difficulty in walking, not elsewhere classified  Muscle weakness (generalized)  ONSET DATE: 03/04/2024  Rationale for Evaluation and Treatment: Rehabilitation  SUBJECTIVE:   SUBJECTIVE STATEMENT:  Patient ambulated into clinic with front wheeled rolling walker. Pt states she was more dizzy the rest of the day after the last session but after that she has felt a lot better and hasn't been dizzy since. She said this is the best she has felt in 4 years. She said she might get a little dizzy when she gets out of bed but it isn't really room spinning.  Pt accompanied by: self  PERTINENT HISTORY: Patient is an 88 YO female who has a PMH of BPPV, dizziness, orthostatic hypotension, gait disorder, imbalance, SOB, hypertension and hypothyroidism.   Vertigo episodes started about 4 years ago - Hx of vertigo: has had a cardio, vascular, ENT, eyes and neuro work up/Often since has a foggy head, vertigo is always there, but some days are worse than others. Edwinna a cane normally, today has a walker.  PAIN:  Are you having pain? No  PRECAUTIONS: Knee and Other: Knee gives out some times/buckles  FALLS: Has patient fallen in last 6 months? No falls, near falls with using wall and furniture to recover  LIVING ENVIRONMENT: Lives with: lives with their spouse  Lives in: House/apartment Stairs: 2 story home, doesn't go upstairs but goes to basement but uses back entrance of 4 steps dwn and 3 step down to go into basement.  Has following equipment at home: Single point cane and Walker - 2 wheeled  PLOF: started using walker 2 weeks ago  PATIENT GOALS: Not be dizzy, legs get stronger, and needs to see orthopedic because her left knee hurts and right knee wants to collapse on her.   OBJECTIVE:  Note: Objective measures were completed at Evaluation unless otherwise noted.  DIAGNOSTIC FINDINGS:  05/24/2020: MR BRAIN W WO  CONTRAST:  IMPRESSION: This MRI of the brain with and without contrast shows the following: 1.   Scattered T2/FLAIR hyperintense foci in the periventricular and deep consistent with mild chronic microvascular ischemic change, essentially unchanged compared to the 12/03/2008 MRI. 2.   Dural based homogenously enhancing mass involving the posterior falx cerebri and directed to the left consistent with a meningioma.  It is not causing any mass-effect.  It is unchanged in size compared to the 10/04/2019 CT scan but increase in size compared to the 12/03/2008 MRI. 3.   No acute findings.  With exception of the meningioma, there is a normal enhancement pattern.  10/04/2019: CT HEAD WO CONTRAST: IMPRESSION: 1.  No acute findings.   2.  Minimal chronic ischemic microvascular disease.  COGNITION: Overall cognitive status: Within functional limits for tasks assessed  POSTURE:  rounded shoulders, forward head, and flexed trunk   Cervical ROM:    Right and left rotation WFL, no pain  TRANSFERS: Sit to stand: SBA and patient becomes more dizzy with standing  GAIT: Gait pattern: decreased stride length, decreased trunk rotation, and trunk flexed Distance walked: Clinic distance Assistive device utilized: Environmental Consultant - 2 wheeled Level of assistance: SBA Comments: Unsteady gait, pt uses FWRW due to feeling more unsteady than usual in the last 2 weeks  VESTIBULAR ASSESSMENT:  GENERAL OBSERVATION: Patient ambulates into clinic with front wheeled rolling walker. Drove to clinic herself.   SYMPTOM BEHAVIOR:  Subjective history:  Non-Vestibular symptoms: when she stands up it gets worse  Type of dizziness: Funny feeling in the head and Swimmyheaded  Frequency: all the time but intensity changes  Duration: rolling over in bed dizziness goes away in about 1  Aggravating factors: Induced by position change: rolling to the right, rolling to the left, supine to sit, and sit to stand and Induced by  motion: looking up at the ceiling, bending down to the ground, turning body quickly, turning head quickly, driving, and activity in general  Relieving factors: closing eyes and slow movements  Progression of symptoms: worse  OCULOMOTOR EXAM:  Ocular Alignment: normal  Ocular ROM: No Limitations  Spontaneous Nystagmus: absent  Gaze-Induced Nystagmus: age appropriate nystagmus at end range and (with upper left specifically)  Smooth Pursuits: intact and appropriate nystagmus L upper at end range  Saccades: intact and slow  Convergence/Divergence: about 5 cm   VESTIBULAR - OCULAR REFLEX:   Slow VOR: Normal  VOR Cancellation: Normal  Head-Impulse Test: HIT Right: unable to determine due to neck guarding HIT Left: unable to determine due to neck guarding   POSITIONAL TESTING: TBA  MOTION SENSITIVITY:  Motion Sensitivity Quotient Intensity: 0 = none, 1 = Lightheaded, 2 = Mild, 3 = Moderate, 4 = Severe, 5 = Vomiting  Intensity, 03/18/24 03/21/24  1. Sitting to supine  2  2. Supine to L side    3. Supine to R side  4. Supine to sitting  2  5. L Hallpike-Dix  0  6. Up from L   2  7. R Hallpike-Dix 4 0  8. Up from R  3 2  9. Sitting, head tipped to L knee    10. Head up from L knee    11. Sitting, head tipped to R knee    12. Head up from R knee    13. Sitting head turns x5    14.Sitting head nods x5    15. In stance, 180 turn to L     16. In stance, 180 turn to R                                                                                                                        TREATMENT DATE:  Self-care: Vitals:   03/21/24 1327  BP: 125/64  Pulse: 77    Education provided to patient on findings during treatment today and that BPPV is negative for both right and left posterior canals. Education on R sided brandt daroff for at home and safety was explained and patient verbally agreed and understood. Patient verbally understood but should continue education during next  session.   NMR:  R Dix hallpike x 2 reps, table mat in trendelenburg with 4 inch steps 1st rep: no nystagmus, no symptoms on R but when sitting up patient felt fogginess in the head but no room spinning 2nd rep: no nystagmus, no symptoms on R but when sitting up patient felt fogginess in the head but no room spinning L Dix hallpike x 1 rep, table mat in trendelenburg with 4 inch steps No nystagmus, no symptoms on L but when sitting up patient felt fogginess in the head but no room spinning R brand daroff, given for HEP Pt had reported some fogginess in head that got a little better after about 15-20 seconds, added R brandt daroff for HEP, education on R sided brandt daroff for at home (someone at home, perform slow, see hand out) and safety was explained and patient verbally agreed and understood    *All tolerated well, no signs or symptoms associated with VBI  PATIENT EDUCATION: Education details: See above Person educated: Patient Education method: Explanation Education comprehension: verbalized understanding and needs further education  HOME EXERCISE PROGRAM:  R brandt daroff handout   GOALS: Goals reviewed with patient? Yes  SHORT TERM GOALS: Target date: 04/11/2024  Pt will be independent with final HEP for improved symptoms and balance. Baseline: to be provided Goal status: INITIAL  Patient will demonstrate (-) positional testing to indicate resolution of BPPV. Baseline: positive R side lying, need to continue  Goal status: INITIAL  3.  MSQ to be assessed with LTG written. Baseline: TBA Goal status: INITIAL   4.  mCTSIB to be assessed with LTG written.  Baseline: TBA Goal status: INITIAL  ASSESSMENT:  CLINICAL IMPRESSION: Patient presents to skilled physical therapy today with focus on right and left posterior canal reassessment  home exercise and education.  With retest of right and left dixhall pike, patient was negative BPPV bilaterally. Education provided to  patient on findings during treatment today and that BPPV is negative for both right and left posterior canals. Education on R sided brandt daroff for at home and safety was explained and patient verbally agreed and understood. Patient verbally understood but should continue education during next session. All treatment tolerated well, no signs or symptoms associated with VBI. Pt continues to benefit from skilled PT to address these impairments and functional limitations to maximize functional mobility independence and decr dizziness.  OBJECTIVE IMPAIRMENTS: Abnormal gait, decreased balance, difficulty walking, dizziness, and postural dysfunction.   ACTIVITY LIMITATIONS: carrying, lifting, bending, standing, squatting, sleeping, stairs, bed mobility, bathing, dressing, and hygiene/grooming  PARTICIPATION LIMITATIONS: meal prep, cleaning, laundry, driving, and shopping  PERSONAL FACTORS: Age, Past/current experiences, Sex, Time since onset of injury/illness/exacerbation, and 1-2 comorbidities:  BPPV, dizziness, orthostatic hypotension, gait disorder, imbalance, SOB, hypertension and hypothyroidism are also affecting patient's functional outcome.   REHAB POTENTIAL: Good  CLINICAL DECISION MAKING: Evolving/moderate complexity  EVALUATION COMPLEXITY: Moderate   PLAN:  PT FREQUENCY: 2x/week  PT DURATION: 4 weeks  PLANNED INTERVENTIONS: 97164- PT Re-evaluation, 97110-Therapeutic exercises, 97530- Therapeutic activity, 97112- Neuromuscular re-education, 97535- Self Care, [id number]- Manual therapy, [phone]- Gait training, [phone]- Canalith repositioning, Patient/Family education, Balance training, Stair training, Vestibular training, Visual/preceptual remediation/compensation, Cognitive remediation, and DME instructions  PLAN FOR NEXT SESSION: Complete MSQ, go over/review R brandt daroff and give left brandt daroff as she gets head fogginess with these movements, assess mCTSIB.   Emmalene Sherry,  Student-PT 03/21/2024, 2:41 PM

## 2024-03-22 ENCOUNTER — Ambulatory Visit

## 2024-03-26 ENCOUNTER — Ambulatory Visit: Admitting: Physical Therapy

## 2024-03-26 ENCOUNTER — Encounter: Payer: Self-pay | Admitting: Physical Therapy

## 2024-03-26 VITALS — BP 158/75 | HR 77

## 2024-03-26 DIAGNOSIS — R42 Dizziness and giddiness: Secondary | ICD-10-CM

## 2024-03-26 DIAGNOSIS — R2681 Unsteadiness on feet: Secondary | ICD-10-CM

## 2024-03-26 NOTE — Therapy (Signed)
 OUTPATIENT PHYSICAL THERAPY VESTIBULAR TREATMENT     Patient Name: ADONIS RYTHER MRN: [id number] DOB:1935-10-02, 88 y.o., female Today's Date: 03/26/2024  END OF SESSION:  PT End of Session - 03/26/24 1024     Visit Number 4    Number of Visits 9    Date for Recertification  04/11/24    Authorization Type [employer] MEDICARE    PT Start Time 1022   pt arrived late   PT Stop Time 1100    PT Time Calculation (min) 38 min    Activity Tolerance Patient tolerated treatment well    Behavior During Therapy WFL for tasks assessed/performed            Past Medical History:  Diagnosis Date   Allergic rhinitis    Asthma    Breast cancer (HCC) 1987   Erysipelas    Hemorrhoids    Hypertension    Hypothyroidism    Past Surgical History:  Procedure Laterality Date   BREAST LUMPECTOMY Right 1986   malignant   TONSILLECTOMY     Patient Active Problem List   Diagnosis Date Noted   Gait disorder 01/20/2023   Bruit 05/23/2022   Chronic constipation 05/17/2022   Chronic venous stasis 05/17/2022   Microscopic hematuria 05/17/2022   Post-menopausal 05/17/2022   Pure hypercholesterolemia 05/17/2022   Rash 05/17/2022   CKD (chronic kidney disease) stage 3, GFR 30-59 ml/min (HCC) 05/17/2022   Imbalance 02/23/2021   Encounter for competency evaluation 01/09/2021   SOB (shortness of breath) 12/06/2020   Benign paroxysmal positional vertigo 05/12/2020   Orthostatic hypotension 09/23/2019   Dizziness 09/17/2019   OAB (overactive bladder) 08/27/2019   Pain of left thumb 08/27/2019   Family history of factor V Leiden mutation 08/11/2019   Venous insufficiency 08/11/2019   Encounter for well adult exam with abnormal findings 08/08/2019   Hypothyroidism    Hypertension    Asthma    Allergic rhinitis    hx: right breast cancer 11/25/2011    PCP: Norleen Lynwood ORN, MD REFERRING PROVIDER: Merlynn Niki FALCON, FNP  REFERRING DIAG: [phone] (ICD-10-CM) - Benign paroxysmal positional vertigo  of left ear   THERAPY DIAG:  Dizziness and giddiness  Unsteadiness on feet  ONSET DATE: 03/04/2024  Rationale for Evaluation and Treatment: Rehabilitation  SUBJECTIVE:   SUBJECTIVE STATEMENT:  Patient ambulated into clinic with front wheeled rolling walker. Reports head feels a little funky today. Did not have any dizziness episodes, her head just felt weird. Sometimes her head feels clearer than others. No falls. In the house, has been using the cane or if she is close to things, walks without the cane. Has an an appt with orthopedics in January about her knee    Pt accompanied by: self  PERTINENT HISTORY: Patient is an 88 YO female who has a PMH of BPPV, dizziness, orthostatic hypotension, gait disorder, imbalance, SOB, hypertension and hypothyroidism.   Vertigo episodes started about 4 years ago - Hx of vertigo: has had a cardio, vascular, ENT, eyes and neuro work up/Often since has a foggy head, vertigo is always there, but some days are worse than others. Edwinna a cane normally, today has a walker.  PAIN:  Are you having pain? No  PRECAUTIONS: Knee and Other: Knee gives out some times/buckles  FALLS: Has patient fallen in last 6 months? No falls, near falls with using wall and furniture to recover  LIVING ENVIRONMENT: Lives with: lives with their spouse Lives in: House/apartment Stairs: 2 story home, doesn't go  upstairs but goes to basement but uses back entrance of 4 steps dwn and 3 step down to go into basement.  Has following equipment at home: Single point cane and Walker - 2 wheeled  PLOF: started using walker 2 weeks ago  PATIENT GOALS: Not be dizzy, legs get stronger, and needs to see orthopedic because her left knee hurts and right knee wants to collapse on her.   OBJECTIVE:  Note: Objective measures were completed at Evaluation unless otherwise noted.  DIAGNOSTIC FINDINGS:  05/24/2020: MR BRAIN W WO CONTRAST:  IMPRESSION: This MRI of the brain with  and without contrast shows the following: 1.   Scattered T2/FLAIR hyperintense foci in the periventricular and deep consistent with mild chronic microvascular ischemic change, essentially unchanged compared to the 12/03/2008 MRI. 2.   Dural based homogenously enhancing mass involving the posterior falx cerebri and directed to the left consistent with a meningioma.  It is not causing any mass-effect.  It is unchanged in size compared to the 10/04/2019 CT scan but increase in size compared to the 12/03/2008 MRI. 3.   No acute findings.  With exception of the meningioma, there is a normal enhancement pattern.  10/04/2019: CT HEAD WO CONTRAST: IMPRESSION: 1.  No acute findings.   2.  Minimal chronic ischemic microvascular disease.  COGNITION: Overall cognitive status: Within functional limits for tasks assessed  POSTURE:  rounded shoulders, forward head, and flexed trunk   Cervical ROM:    Right and left rotation WFL, no pain  TRANSFERS: Sit to stand: SBA and patient becomes more dizzy with standing  GAIT: Gait pattern: decreased stride length, decreased trunk rotation, and trunk flexed Distance walked: Clinic distance Assistive device utilized: Environmental Consultant - 2 wheeled Level of assistance: SBA Comments: Unsteady gait, pt uses FWRW due to feeling more unsteady than usual in the last 2 weeks  VESTIBULAR ASSESSMENT:  GENERAL OBSERVATION: Patient ambulates into clinic with front wheeled rolling walker. Drove to clinic herself.   SYMPTOM BEHAVIOR:  Subjective history:  Non-Vestibular symptoms: when she stands up it gets worse  Type of dizziness: Funny feeling in the head and Swimmyheaded  Frequency: all the time but intensity changes  Duration: rolling over in bed dizziness goes away in about 1  Aggravating factors: Induced by position change: rolling to the right, rolling to the left, supine to sit, and sit to stand and Induced by motion: looking up at the ceiling, bending down to the  ground, turning body quickly, turning head quickly, driving, and activity in general  Relieving factors: closing eyes and slow movements  Progression of symptoms: worse  OCULOMOTOR EXAM:  Ocular Alignment: normal  Ocular ROM: No Limitations  Spontaneous Nystagmus: absent  Gaze-Induced Nystagmus: age appropriate nystagmus at end range and (with upper left specifically)  Smooth Pursuits: intact and appropriate nystagmus L upper at end range  Saccades: intact and slow  Convergence/Divergence: about 5 cm   VESTIBULAR - OCULAR REFLEX:   Slow VOR: Normal  VOR Cancellation: Normal  Head-Impulse Test: HIT Right: unable to determine due to neck guarding HIT Left: unable to determine due to neck guarding  TREATMENT DATE:  Therapeutic Activity:  Vitals:   03/26/24 1032  BP: (!) 158/75  Pulse: 77    MOTION SENSITIVITY:  Motion Sensitivity Quotient Intensity: 0 = none, 1 = Lightheaded, 2 = Mild, 3 = Moderate, 4 = Severe, 5 = Vomiting  Intensity, 03/18/24 03/21/24 03/26/24  1. Sitting to supine  2   2. Supine to L side     3. Supine to R side     4. Supine to sitting  2   5. L Hallpike-Dix  0   6. Up from L   2   7. R Hallpike-Dix 4 0   8. Up from R  3 2   9. Sitting, head tipped to L knee   0  10. Head up from L knee   0  11. Sitting, head tipped to R knee   0  12. Head up from R knee   0  13. Sitting head turns x5   1  14.Sitting head nods x5   1/2 - 1   15. In stance, 180 turn to L    1  16. In stance, 180 turn to R   0     Education provided to patient on findings during treatment today and that BPPV continues to be negative for both right and left posterior canals. Education on purpose of R sided brandt daroff for at home and proper reps.    NMR:  Performed with mat in Trendelenburg with 4 inch steps, due to incr thoracic kyphosis and decr cervical  AROM  POSITIONAL TESTING: Right Dix-Hallpike: no nystagmus Left Dix-Hallpike: no nystagmus No dizziness, pt only felt a weird feeling in her head when coming up from L   Gaze Adaptation: x1 Viewing Horizontal: Position: Seated, Time: 30 seconds, Reps: 2, and Comment: no sx in seated even with incr head speed, performed in standing at RW with no symptoms Pt had previously performed this exercise in previous bout of therapy, going to continue to work on it again   Access Code: [email or web address] URL: [email or web address] Date: 03/26/2024 Prepared by: Sheffield Senate  Added below to HEP to work on habituation/standing balance:   Exercises - Standing with Head Rotation  - 1 x daily - 5 x weekly - 2 sets - 10 reps and head nods, pt with no dizziness with head nods, just feeling more unsteady.    PATIENT EDUCATION: Education details: Resolution of BPPV, habituation/balance exercises to HEP, continue R [employer] HEP  Person educated: Patient Education method: Explanation, Demonstration, Verbal cues, and Handouts Education comprehension: verbalized understanding, returned demonstration, and needs further education  HOME EXERCISE PROGRAM:  R brandt daroff handout  Access Code: [email or web address] URL: [email or web address] Date: 03/26/2024 Prepared by: Sheffield Senate  Exercises - Standing with Head Rotation  - 1 x daily - 5 x weekly - 2 sets - 10 reps and head nods    GOALS: Goals reviewed with patient? Yes  SHORT TERM GOALS: Target date: 04/11/2024  Pt will be independent with final HEP for improved symptoms and balance. Baseline: to be provided Goal status: INITIAL  Patient will demonstrate (-) positional testing to indicate resolution of BPPV. Baseline: positive R side lying, need to continue  Goal status: INITIAL  3.  Pt will perform head motions, turning on MSQ to 0/5 in order to demo improved motion sensitivity.  Baseline: 1/5 (see chart on 12/9) Goal  status: INITIAL   4.  mCTSIB to be assessed with LTG written.  Baseline: TBA Goal status:  INITIAL  ASSESSMENT:  CLINICAL IMPRESSION: Pt reports no further episodes of feeling dizziness since she was last here, reports that just her head feels weird. Today's skilled session focused on re-assessing BPPV with pt continues to be negative with no nystagmus/spinning. Did encourage pt to continue [facility] exercises due to lingering motion sensitivity. Assessed MSQ with pt only reporting slight dizziness with turning to the L and head motions. Remainder of session focused on adding standing balance to HEP with head motions with pt feeling more off balance with head nods and only had slight dizziness with head turns, but none with the 2nd set. Will continue per POC.   OBJECTIVE IMPAIRMENTS: Abnormal gait, decreased balance, difficulty walking, dizziness, and postural dysfunction.   ACTIVITY LIMITATIONS: carrying, lifting, bending, standing, squatting, sleeping, stairs, bed mobility, bathing, dressing, and hygiene/grooming  PARTICIPATION LIMITATIONS: meal prep, cleaning, laundry, driving, and shopping  PERSONAL FACTORS: Age, Past/current experiences, Sex, Time since onset of injury/illness/exacerbation, and 1-2 comorbidities:  BPPV, dizziness, orthostatic hypotension, gait disorder, imbalance, SOB, hypertension and hypothyroidism are also affecting patient's functional outcome.   REHAB POTENTIAL: Good  CLINICAL DECISION MAKING: Evolving/moderate complexity  EVALUATION COMPLEXITY: Moderate   PLAN:  PT FREQUENCY: 2x/week  PT DURATION: 4 weeks  PLANNED INTERVENTIONS: 97164- PT Re-evaluation, 97110-Therapeutic exercises, 97530- Therapeutic activity, 97112- Neuromuscular re-education, 97535- Self Care, 02859- Manual therapy, [phone]- Gait training, [phone]- Canalith repositioning, Patient/Family education, Balance training, Stair training, Vestibular training, Visual/preceptual  remediation/compensation, Cognitive remediation, and DME instructions  PLAN FOR NEXT SESSION: how is Wilhelmena Carrel and HEP going? assess mCTSIB. And further work on vestibular tasks for balance    Sheffield LOISE Senate, PT, DPT 03/26/2024, 12:52 PM

## 2024-03-29 ENCOUNTER — Ambulatory Visit: Admitting: Physical Therapy

## 2024-03-29 ENCOUNTER — Encounter: Payer: Self-pay | Admitting: Physical Therapy

## 2024-03-29 VITALS — BP 156/75 | HR 75

## 2024-03-29 DIAGNOSIS — R42 Dizziness and giddiness: Secondary | ICD-10-CM | POA: Diagnosis not present

## 2024-03-29 DIAGNOSIS — R2681 Unsteadiness on feet: Secondary | ICD-10-CM

## 2024-03-29 NOTE — Therapy (Signed)
 OUTPATIENT PHYSICAL THERAPY VESTIBULAR TREATMENT     Patient Name: Julia Norton MRN: [id number] DOB:Aug 24, 1935, 88 y.o., female Today's Date: 03/29/2024  END OF SESSION:  PT End of Session - 03/29/24 1025     Visit Number 5    Number of Visits 9    Date for Recertification  04/11/24    Authorization Type [employer] MEDICARE    PT Start Time 1023   pt late to session   PT Stop Time 1102    PT Time Calculation (min) 39 min    Equipment Utilized During Treatment Gait belt    Activity Tolerance Patient tolerated treatment well    Behavior During Therapy WFL for tasks assessed/performed            Past Medical History:  Diagnosis Date   Allergic rhinitis    Asthma    Breast cancer (HCC) 1987   Erysipelas    Hemorrhoids    Hypertension    Hypothyroidism    Past Surgical History:  Procedure Laterality Date   BREAST LUMPECTOMY Right 1986   malignant   TONSILLECTOMY     Patient Active Problem List   Diagnosis Date Noted   Gait disorder 01/20/2023   Bruit 05/23/2022   Chronic constipation 05/17/2022   Chronic venous stasis 05/17/2022   Microscopic hematuria 05/17/2022   Post-menopausal 05/17/2022   Pure hypercholesterolemia 05/17/2022   Rash 05/17/2022   CKD (chronic kidney disease) stage 3, GFR 30-59 ml/min (HCC) 05/17/2022   Imbalance 02/23/2021   Encounter for competency evaluation 01/09/2021   SOB (shortness of breath) 12/06/2020   Benign paroxysmal positional vertigo 05/12/2020   Orthostatic hypotension 09/23/2019   Dizziness 09/17/2019   OAB (overactive bladder) 08/27/2019   Pain of left thumb 08/27/2019   Family history of factor V Leiden mutation 08/11/2019   Venous insufficiency 08/11/2019   Encounter for well adult exam with abnormal findings 08/08/2019   Hypothyroidism    Hypertension    Asthma    Allergic rhinitis    hx: right breast cancer 11/25/2011    PCP: Norleen Lynwood ORN, MD REFERRING PROVIDER: Merlynn Niki FALCON, FNP  REFERRING DIAG:  [phone] (ICD-10-CM) - Benign paroxysmal positional vertigo of left ear   THERAPY DIAG:  Dizziness and giddiness  Unsteadiness on feet  ONSET DATE: 03/04/2024  Rationale for Evaluation and Treatment: Rehabilitation  SUBJECTIVE:   SUBJECTIVE STATEMENT:  Nothing new. Still feeling a fullness in her head. Thinks if she does a lot, then she will get that feeling in her head. Notes that it is nowhere near where it was. Sometimes does not feel it at all. Has been working on her exercises. Reports her legs are now more bothersome than her head. Knees are bothersome, going to see the orthopedic in January.   Pt accompanied by: self  PERTINENT HISTORY: Patient is an 88 YO female who has a PMH of BPPV, dizziness, orthostatic hypotension, gait disorder, imbalance, SOB, hypertension and hypothyroidism.   Vertigo episodes started about 4 years ago - Hx of vertigo: has had a cardio, vascular, ENT, eyes and neuro work up/Often since has a foggy head, vertigo is always there, but some days are worse than others. Edwinna a cane normally, today has a walker.  PAIN:  Are you having pain? No  PRECAUTIONS: Knee and Other: Knee gives out some times/buckles  FALLS: Has patient fallen in last 6 months? No falls, near falls with using wall and furniture to recover  LIVING ENVIRONMENT: Lives with: lives with their spouse  Lives in: House/apartment Stairs: 2 story home, doesn't go upstairs but goes to basement but uses back entrance of 4 steps dwn and 3 step down to go into basement.  Has following equipment at home: Single point cane and Walker - 2 wheeled  PLOF: started using walker 2 weeks ago  PATIENT GOALS: Not be dizzy, legs get stronger, and needs to see orthopedic because her left knee hurts and right knee wants to collapse on her.   OBJECTIVE:  Note: Objective measures were completed at Evaluation unless otherwise noted.  DIAGNOSTIC FINDINGS:  05/24/2020: MR BRAIN W WO  CONTRAST:  IMPRESSION: This MRI of the brain with and without contrast shows the following: 1.   Scattered T2/FLAIR hyperintense foci in the periventricular and deep consistent with mild chronic microvascular ischemic change, essentially unchanged compared to the 12/03/2008 MRI. 2.   Dural based homogenously enhancing mass involving the posterior falx cerebri and directed to the left consistent with a meningioma.  It is not causing any mass-effect.  It is unchanged in size compared to the 10/04/2019 CT scan but increase in size compared to the 12/03/2008 MRI. 3.   No acute findings.  With exception of the meningioma, there is a normal enhancement pattern.  10/04/2019: CT HEAD WO CONTRAST: IMPRESSION: 1.  No acute findings.   2.  Minimal chronic ischemic microvascular disease.  COGNITION: Overall cognitive status: Within functional limits for tasks assessed  POSTURE:  rounded shoulders, forward head, and flexed trunk   Cervical ROM:    Right and left rotation WFL, no pain  TRANSFERS: Sit to stand: SBA and patient becomes more dizzy with standing  GAIT: Gait pattern: decreased stride length, decreased trunk rotation, and trunk flexed Distance walked: Clinic distance Assistive device utilized: Environmental Consultant - 2 wheeled Level of assistance: SBA Comments: Unsteady gait, pt uses FWRW due to feeling more unsteady than usual in the last 2 weeks  VESTIBULAR ASSESSMENT:  GENERAL OBSERVATION: Patient ambulates into clinic with front wheeled rolling walker. Drove to clinic herself.   SYMPTOM BEHAVIOR:  Subjective history:  Non-Vestibular symptoms: when she stands up it gets worse  Type of dizziness: Funny feeling in the head and Swimmyheaded  Frequency: all the time but intensity changes  Duration: rolling over in bed dizziness goes away in about 1  Aggravating factors: Induced by position change: rolling to the right, rolling to the left, supine to sit, and sit to stand and Induced by  motion: looking up at the ceiling, bending down to the ground, turning body quickly, turning head quickly, driving, and activity in general  Relieving factors: closing eyes and slow movements  Progression of symptoms: worse  OCULOMOTOR EXAM:  Ocular Alignment: normal  Ocular ROM: No Limitations  Spontaneous Nystagmus: absent  Gaze-Induced Nystagmus: age appropriate nystagmus at end range and (with upper left specifically)  Smooth Pursuits: intact and appropriate nystagmus L upper at end range  Saccades: intact and slow  Convergence/Divergence: about 5 cm   VESTIBULAR - OCULAR REFLEX:   Slow VOR: Normal  VOR Cancellation: Normal  Head-Impulse Test: HIT Right: unable to determine due to neck guarding HIT Left: unable to determine due to neck guarding  TREATMENT DATE:  Therapeutic Activity:  Vitals:   03/29/24 1030  BP: (!) 156/75  Pulse: 75   Reviewed Brandt Daroff exercises x1 rep each side, pt with no symptoms Pt also reports not having any sx when doing at home, discussed that if pt is not having sx when doing them then does not have to perform as HEP. If pt does start to get any motion sensitivity with returning upright then can try doing them again.   Standing at countertop for general strengthening with BUE support  10 reps heel raises Attempted 10 reps hip ABD each side for general strength/balance, pt reporting feeling it more in her R groin area, so discontinued this  Performed 5 reps of sit <> stands with BUE support from mat table, initial cues for more forward lean, pt reporting feeling more pain in her knee area so did not add to HEP  Discussed POC going forwards, pt referred for BPPV and now that is cleared, will further address balance/general strength and then may hold off until after pt sees the orthopedic in Jan and pt may not need all appts. Pt in  agreement with plan, Discussed that after D/C from PT if BPPV returns in the future, then would need a new referral    NMR:  On air ex in corner: Wide BOS EC 3 x 30 seconds Wide BOS EO 10 reps head turns, 10 reps head nods Wide BOS EC  2 x 5 reps head turns, 2  x 5 reps head nods, pt reporting feeling a little off with head nods   PATIENT EDUCATION: Education details: see therapeutic activity section, corner balance additions to HEP Person educated: Patient Education method: Explanation, Demonstration, Verbal cues, and Handouts Education comprehension: verbalized understanding, returned demonstration, and needs further education  HOME EXERCISE PROGRAM:  R brandt daroff handout  Access Code: [email or web address] URL: [email or web address] Date: 03/29/2024 Prepared by: Sheffield Senate  Exercises - Standing with Head Rotation  - 1 x daily - 5 x weekly - 2 sets - 10 reps and head nods - Wide Stance with Eyes Closed and Head Nods on Foam Pad  - 1-2 x daily - 5 x weekly - 3 sets - 30 hold - Wide Stance with Eyes Closed and Head Rotation on Foam Pad  - 1-2 x daily - 5 x weekly - 2 sets - 5 reps - Heel Raises with Counter Support  - 1-2 x daily - 5 x weekly - 2 sets - 10 reps  GOALS: Goals reviewed with patient? Yes  SHORT TERM GOALS: Target date: 04/11/2024  Pt will be independent with final HEP for improved symptoms and balance. Baseline: to be provided Goal status: INITIAL  Patient will demonstrate (-) positional testing to indicate resolution of BPPV. Baseline: positive R side lying, need to continue  Goal status: INITIAL  3.  Pt will perform head motions, turning on MSQ to 0/5 in order to demo improved motion sensitivity.  Baseline: 1/5 (see chart on 12/9) Goal status: INITIAL   4.  mCTSIB to be assessed with LTG written.  Baseline: TBA Goal status: INITIAL  ASSESSMENT:  CLINICAL IMPRESSION: Reviewed Wilhelmena Carrel exercises with pt having no nystagmus/dizziness in  any position or with return to upright. Discussed that pt no longer has to perform these at home if she is not having sx. Worked on vestibular tasks for balance on foam pad in the corner and added to HEP. Pt with mild sway, but no LOB and no dizziness reported.  Pt reporting that she feels like her main problem is her BLE/knees and sees the orthopedic about this next month. Will try to address with gentle strengthening if pt is able to tolerate, otherwise will hold off until she sees the orthopedic doctor. Do not anticipate pt needing all visits now that BPPV is cleared. Will continue per POC.   OBJECTIVE IMPAIRMENTS: Abnormal gait, decreased balance, difficulty walking, dizziness, and postural dysfunction.   ACTIVITY LIMITATIONS: carrying, lifting, bending, standing, squatting, sleeping, stairs, bed mobility, bathing, dressing, and hygiene/grooming  PARTICIPATION LIMITATIONS: meal prep, cleaning, laundry, driving, and shopping  PERSONAL FACTORS: Age, Past/current experiences, Sex, Time since onset of injury/illness/exacerbation, and 1-2 comorbidities:  BPPV, dizziness, orthostatic hypotension, gait disorder, imbalance, SOB, hypertension and hypothyroidism are also affecting patient's functional outcome.   REHAB POTENTIAL: Good  CLINICAL DECISION MAKING: Evolving/moderate complexity  EVALUATION COMPLEXITY: Moderate   PLAN:  PT FREQUENCY: 2x/week  PT DURATION: 4 weeks  PLANNED INTERVENTIONS: 97164- PT Re-evaluation, 97110-Therapeutic exercises, 97530- Therapeutic activity, 97112- Neuromuscular re-education, 97535- Self Care, [id number]- Manual therapy, [phone]- Gait training, [phone]- Canalith repositioning, Patient/Family education, Balance training, Stair training, Vestibular training, Visual/preceptual remediation/compensation, Cognitive remediation, and DME instructions  PLAN FOR NEXT SESSION: work on vestibular balance tasks, general hip strengthening if pt can tolerate, do not anticipate using  all visits    Sheffield LOISE Senate, PT, DPT 03/29/2024, 11:16 AM

## 2024-04-02 ENCOUNTER — Ambulatory Visit: Admitting: Physical Therapy

## 2024-04-04 ENCOUNTER — Ambulatory Visit: Admitting: Physical Therapy

## 2024-04-08 ENCOUNTER — Ambulatory Visit: Admitting: Physical Therapy

## 2024-04-10 ENCOUNTER — Ambulatory Visit: Admitting: Physical Therapy

## 2024-04-15 ENCOUNTER — Other Ambulatory Visit: Payer: Self-pay | Admitting: Cardiology

## 2024-04-15 ENCOUNTER — Other Ambulatory Visit: Payer: Self-pay | Admitting: Internal Medicine

## 2024-04-15 ENCOUNTER — Other Ambulatory Visit: Payer: Self-pay

## 2024-04-23 DIAGNOSIS — M25561 Pain in right knee: Secondary | ICD-10-CM | POA: Insufficient documentation

## 2024-04-23 DIAGNOSIS — M25551 Pain in right hip: Secondary | ICD-10-CM | POA: Insufficient documentation

## 2024-04-28 ENCOUNTER — Other Ambulatory Visit: Payer: Self-pay | Admitting: Cardiology

## 2024-05-06 ENCOUNTER — Ambulatory Visit: Payer: Self-pay | Admitting: Internal Medicine

## 2024-05-06 ENCOUNTER — Encounter: Payer: Self-pay | Admitting: Internal Medicine

## 2024-05-06 ENCOUNTER — Ambulatory Visit: Admitting: Internal Medicine

## 2024-05-06 VITALS — BP 122/78 | HR 73 | Temp 97.7°F | Ht 68.0 in | Wt 169.0 lb

## 2024-05-06 DIAGNOSIS — E039 Hypothyroidism, unspecified: Secondary | ICD-10-CM

## 2024-05-06 DIAGNOSIS — M25519 Pain in unspecified shoulder: Secondary | ICD-10-CM | POA: Insufficient documentation

## 2024-05-06 DIAGNOSIS — E7800 Pure hypercholesterolemia, unspecified: Secondary | ICD-10-CM | POA: Diagnosis not present

## 2024-05-06 DIAGNOSIS — R350 Frequency of micturition: Secondary | ICD-10-CM | POA: Diagnosis not present

## 2024-05-06 DIAGNOSIS — I10 Essential (primary) hypertension: Secondary | ICD-10-CM

## 2024-05-06 DIAGNOSIS — N1831 Chronic kidney disease, stage 3a: Secondary | ICD-10-CM | POA: Diagnosis not present

## 2024-05-06 LAB — HEPATIC FUNCTION PANEL
ALT: 17 U/L (ref 3–35)
AST: 21 U/L (ref 5–37)
Albumin: 4.4 g/dL (ref 3.5–5.2)
Alkaline Phosphatase: 68 U/L (ref 39–117)
Bilirubin, Direct: 0.1 mg/dL (ref 0.1–0.3)
Total Bilirubin: 0.8 mg/dL (ref 0.2–1.2)
Total Protein: 6.7 g/dL (ref 6.0–8.3)

## 2024-05-06 LAB — URINALYSIS, ROUTINE W REFLEX MICROSCOPIC
Bilirubin Urine: NEGATIVE
Hgb urine dipstick: NEGATIVE
Ketones, ur: NEGATIVE
Leukocytes,Ua: NEGATIVE
Nitrite: NEGATIVE
RBC / HPF: NONE SEEN
Specific Gravity, Urine: 1.01 (ref 1.000–1.030)
Total Protein, Urine: NEGATIVE
Urine Glucose: NEGATIVE
Urobilinogen, UA: 0.2 (ref 0.0–1.0)
pH: 7 (ref 5.0–8.0)

## 2024-05-06 LAB — CBC WITH DIFFERENTIAL/PLATELET
Basophils Absolute: 0 K/uL (ref 0.0–0.1)
Basophils Relative: 0.5 % (ref 0.0–3.0)
Eosinophils Absolute: 0.4 K/uL (ref 0.0–0.7)
Eosinophils Relative: 3.5 % (ref 0.0–5.0)
HCT: 42.5 % (ref 36.0–46.0)
Hemoglobin: 14.4 g/dL (ref 12.0–15.0)
Lymphocytes Relative: 15.7 % (ref 12.0–46.0)
Lymphs Abs: 1.7 K/uL (ref 0.7–4.0)
MCHC: 33.8 g/dL (ref 30.0–36.0)
MCV: 86 fl (ref 78.0–100.0)
Monocytes Absolute: 0.7 K/uL (ref 0.1–1.0)
Monocytes Relative: 7 % (ref 3.0–12.0)
Neutro Abs: 7.8 K/uL — ABNORMAL HIGH (ref 1.4–7.7)
Neutrophils Relative %: 73.3 % (ref 43.0–77.0)
Platelets: 207 K/uL (ref 150.0–400.0)
RBC: 4.94 Mil/uL (ref 3.87–5.11)
RDW: 15.2 % (ref 11.5–15.5)
WBC: 10.6 K/uL — ABNORMAL HIGH (ref 4.0–10.5)

## 2024-05-06 LAB — BASIC METABOLIC PANEL WITH GFR
BUN: 26 mg/dL — ABNORMAL HIGH (ref 6–23)
CO2: 33 meq/L — ABNORMAL HIGH (ref 19–32)
Calcium: 9.5 mg/dL (ref 8.4–10.5)
Chloride: 102 meq/L (ref 96–112)
Creatinine, Ser: 0.99 mg/dL (ref 0.40–1.20)
GFR: 51.49 mL/min — ABNORMAL LOW
Glucose, Bld: 94 mg/dL (ref 70–99)
Potassium: 4.4 meq/L (ref 3.5–5.1)
Sodium: 140 meq/L (ref 135–145)

## 2024-05-06 LAB — TSH: TSH: 0.97 u[IU]/mL (ref 0.35–5.50)

## 2024-05-06 MED ORDER — MIRABEGRON ER 25 MG PO TB24: 25.0000 mg | ORAL_TABLET | Freq: Every day | ORAL | 3 refills | Status: AC

## 2024-05-06 MED ORDER — HYDRALAZINE HCL 50 MG PO TABS: 50.0000 mg | ORAL_TABLET | Freq: Two times a day (BID) | ORAL | 3 refills | Status: AC

## 2024-05-06 NOTE — Assessment & Plan Note (Signed)
 Lab Results  Component Value Date   CREATININE 0.99 05/06/2024   Stable overall, cont to avoid nephrotoxins

## 2024-05-06 NOTE — Patient Instructions (Signed)
 Ok to change the Gemtesa  to Myrbetriq  25 mg per day  Please continue all other medications as before, and refills have been done if requested.  Please have the pharmacy call with any other refills you may need.  Please continue your efforts at being more active, low cholesterol diet, and weight control.  You are otherwise up to date with prevention measures today.  Please keep your appointments with your specialists as you may have planned  Please go to the LAB at the blood drawing area for the tests to be done  You will be contacted by phone if any changes need to be made immediately.  Otherwise, you will receive a letter about your results with an explanation, but please check with MyChart first.  Please make an Appointment to return in 6 months, or sooner if needed

## 2024-05-06 NOTE — Assessment & Plan Note (Signed)
 Lab Results  Component Value Date   LDLCALC 110 (H) 07/21/2023   Uncontrolled, pt for lower chol diet, declines statin

## 2024-05-06 NOTE — Assessment & Plan Note (Signed)
 Lab Results  Component Value Date   TSH 0.97 05/06/2024   Stable, pt to continue levothyroxine  100 mcg qd

## 2024-05-06 NOTE — Assessment & Plan Note (Signed)
 BP Readings from Last 3 Encounters:  05/06/24 122/78  03/29/24 (!) 156/75  03/26/24 (!) 158/75   Stable, pt to continue medical treatment diovan  hct 320 25 every day, hydralazine  50 bid

## 2024-05-06 NOTE — Assessment & Plan Note (Addendum)
 Worsening, likely oab, gemtesa  not working well, for change to myrbetriq  25 mg and consider urology if not improved, for ua and cx

## 2024-05-06 NOTE — Progress Notes (Signed)
 Patient ID: Julia Norton, female   DOB: 21-Dec-1935, 89 y.o.   MRN: [id number]        Chief Complaint: follow up OAB, hld, low thryoid, htn, ckd3a       HPI:  Julia Norton is a 89 y.o. female here overall doing ok except for several months of urinary frequency without pain or fever, but has to go to BR every 2-3 hrs even at night, Denies urinary symptoms such as dysuria, urgency, flank pain, hematuria or n/v, fever, chills. Pt denies chest pain, increased sob or doe, wheezing, orthopnea, PND, increased LE swelling, palpitations, dizziness or syncope.   Pt denies polydipsia, polyuria, or new focal neuro s/s.    Plans to f/u mammogram soon.  Wt Readings from Last 3 Encounters:  05/06/24 169 lb (76.7 kg)  03/04/24 173 lb (78.5 kg)  09/28/23 173 lb (78.5 kg)   BP Readings from Last 3 Encounters:  05/06/24 122/78  03/29/24 (!) 156/75  03/26/24 (!) 158/75         Past Medical History:  Diagnosis Date   Allergic rhinitis    Asthma    Breast cancer (HCC) 1987   Erysipelas    Hemorrhoids    Hypertension    Hypothyroidism    Past Surgical History:  Procedure Laterality Date   BREAST LUMPECTOMY Right 1986   malignant   TONSILLECTOMY      reports that she quit smoking about 63 years ago. Her smoking use included cigarettes. She has never used smokeless tobacco. She reports that she does not drink alcohol and does not use drugs. family history includes Arthritis in her father, mother, and sister; COPD in her sister; Celiac disease in her sister; Heart disease in her mother; Heart disease (age of onset: 49) in her father; Heart disease (age of onset: 16) in her sister; Hypertension in her mother and sister. Allergies[1] Medications Ordered Prior to Encounter[2]      ROS:  All others reviewed and negative.  Objective        PE:  BP 122/78 (BP Location: Right Arm, Patient Position: Sitting, Cuff Size: Normal)   Pulse 73   Temp 97.7 F (36.5 C) (Oral)   Ht 5' 8 (1.727 m)   Wt  169 lb (76.7 kg)   SpO2 96%   BMI 25.70 kg/m                 Constitutional: Pt appears in NAD               HENT: Head: NCAT.                Right Ear: External ear normal.                 Left Ear: External ear normal.                Eyes: . Pupils are equal, round, and reactive to light. Conjunctivae and EOM are normal               Nose: without d/c or deformity               Neck: Neck supple. Gross normal ROM               Cardiovascular: Normal rate and regular rhythm.                 Pulmonary/Chest: Effort normal and breath sounds without rales or wheezing.  Abd:  Soft, NT, ND, + BS, no organomegaly               Neurological: Pt is alert. At baseline orientation, motor grossly intact               Skin: Skin is warm. No rashes, no other new lesions, LE edema - none               Psychiatric: Pt behavior is normal without agitation   Micro: none  Cardiac tracings I have personally interpreted today:  none  Pertinent Radiological findings (summarize): none   Lab Results  Component Value Date   WBC 10.6 (H) 05/06/2024   HGB 14.4 05/06/2024   HCT 42.5 05/06/2024   PLT 207.0 05/06/2024   GLUCOSE 94 05/06/2024   CHOL 182 07/21/2023   TRIG 76.0 07/21/2023   HDL 56.60 07/21/2023   LDLCALC 110 (H) 07/21/2023   ALT 17 05/06/2024   AST 21 05/06/2024   NA 140 05/06/2024   K 4.4 05/06/2024   CL 102 05/06/2024   CREATININE 0.99 05/06/2024   BUN 26 (H) 05/06/2024   CO2 33 (H) 05/06/2024   TSH 0.97 05/06/2024   HGBA1C 5.1 07/21/2023   Assessment/Plan:  Julia Norton is a 89 y.o. White or Caucasian [1] female with  has a past medical history of Allergic rhinitis, Asthma, Breast cancer (HCC) (1987), Erysipelas, Hemorrhoids, Hypertension, and Hypothyroidism.  Pure hypercholesterolemia Lab Results  Component Value Date   LDLCALC 110 (H) 07/21/2023   Uncontrolled, pt for lower chol diet, declines statin   Hypothyroidism Lab Results  Component Value  Date   TSH 0.97 05/06/2024   Stable, pt to continue levothyroxine  100 mcg qd   Hypertension BP Readings from Last 3 Encounters:  05/06/24 122/78  03/29/24 (!) 156/75  03/26/24 (!) 158/75   Stable, pt to continue medical treatment diovan  hct 320 25 every day, hydralazine  50 bid    CKD (chronic kidney disease) stage 3, GFR 30-59 ml/min (HCC) Lab Results  Component Value Date   CREATININE 0.99 05/06/2024   Stable overall, cont to avoid nephrotoxins   Urinary frequency Worsening, likely oab, gemtesa  not working well, for change to myrbetriq  25 mg and consider urology if not improved, for ua and cx  Followup: Return in about 6 months (around 11/03/2024).  Julia Rush, MD 05/06/2024 8:33 PM [facility]     [1]  Allergies Allergen Reactions   Penicillins     Does not remember the reaction to medication.  [2]  Current Outpatient Medications on File Prior to Visit  Medication Sig Dispense Refill   aspirin 81 MG tablet Take 2 tablets by mouth once daily     b complex vitamins capsule Take 1 capsule by mouth daily.     Cholecalciferol (VITAMIN D3) 25 MCG (1000 UT) CAPS Take by mouth daily.     loratadine (CLARITIN) 10 MG tablet Take 10 mg by mouth daily.     Magnesium 400 MG CAPS Take by mouth.     Multiple Vitamin (MULTIVITAMIN PO) Take 1 tablet by mouth daily.     Omega-3 Fatty Acids (FISH OIL) 1000 MG CAPS Take 1 capsule by mouth daily.     predniSONE  (DELTASONE ) 20 MG tablet TAKE 2 TABLETS BY MOUTH ONCE DAILY FOR 5 DAYS     SYNTHROID  100 MCG tablet TAKE 1 TABLET DAILY 90 tablet 3   valsartan -hydrochlorothiazide  (DIOVAN -HCT) 320-25 MG tablet  TAKE 1 TABLET DAILY 90 tablet 1   No current facility-administered medications on file prior to visit.

## 2024-05-07 LAB — URINE CULTURE: Result:: NO GROWTH

## 2024-10-01 ENCOUNTER — Ambulatory Visit

## 2024-10-03 ENCOUNTER — Ambulatory Visit
